# Patient Record
Sex: Female | Born: 1945 | Race: White | Hispanic: No | Marital: Married | Smoking: Former smoker
Health system: Southern US, Community
[De-identification: ages and names within clinical notes are randomized; demographics above are authoritative.]

## PROBLEM LIST (undated history)

## (undated) DIAGNOSIS — K5790 Diverticulosis of intestine, part unspecified, without perforation or abscess without bleeding: Secondary | ICD-10-CM

## (undated) DIAGNOSIS — Z803 Family history of malignant neoplasm of breast: Secondary | ICD-10-CM

## (undated) DIAGNOSIS — Z800 Family history of malignant neoplasm of digestive organs: Secondary | ICD-10-CM

## (undated) DIAGNOSIS — E785 Hyperlipidemia, unspecified: Secondary | ICD-10-CM

## (undated) DIAGNOSIS — I10 Essential (primary) hypertension: Secondary | ICD-10-CM

## (undated) DIAGNOSIS — Z8041 Family history of malignant neoplasm of ovary: Secondary | ICD-10-CM

## (undated) DIAGNOSIS — Z780 Asymptomatic menopausal state: Secondary | ICD-10-CM

## (undated) DIAGNOSIS — C801 Malignant (primary) neoplasm, unspecified: Secondary | ICD-10-CM

## (undated) DIAGNOSIS — M1990 Unspecified osteoarthritis, unspecified site: Secondary | ICD-10-CM

## (undated) DIAGNOSIS — Z808 Family history of malignant neoplasm of other organs or systems: Secondary | ICD-10-CM

## (undated) HISTORY — DX: Family history of malignant neoplasm of breast: Z80.3

## (undated) HISTORY — DX: Hyperlipidemia, unspecified: E78.5

## (undated) HISTORY — PX: MIDDLE EAR SURGERY: SHX713

## (undated) HISTORY — DX: Essential (primary) hypertension: I10

## (undated) HISTORY — DX: Family history of malignant neoplasm of other organs or systems: Z80.8

## (undated) HISTORY — PX: CLEFT PALATE REPAIR: SUR1165

## (undated) HISTORY — DX: Family history of malignant neoplasm of digestive organs: Z80.0

## (undated) HISTORY — DX: Family history of malignant neoplasm of ovary: Z80.41

## (undated) HISTORY — DX: Diverticulosis of intestine, part unspecified, without perforation or abscess without bleeding: K57.90

## (undated) HISTORY — DX: Asymptomatic menopausal state: Z78.0

## (undated) HISTORY — PX: BLADDER SURGERY: SHX569

## (undated) HISTORY — PX: ABDOMINAL HYSTERECTOMY: SHX81

---

## 2006-04-15 ENCOUNTER — Encounter: Payer: Self-pay | Admitting: Family Medicine

## 2006-06-27 ENCOUNTER — Encounter: Payer: Self-pay | Admitting: Family Medicine

## 2006-07-15 ENCOUNTER — Encounter: Payer: Self-pay | Admitting: Family Medicine

## 2006-08-03 LAB — HM COLONOSCOPY: HM Colonoscopy: NORMAL

## 2006-08-15 ENCOUNTER — Encounter: Payer: Self-pay | Admitting: Family Medicine

## 2006-09-02 ENCOUNTER — Encounter: Payer: Self-pay | Admitting: Family Medicine

## 2006-09-23 ENCOUNTER — Encounter: Payer: Self-pay | Admitting: Family Medicine

## 2006-09-24 ENCOUNTER — Encounter: Payer: Self-pay | Admitting: Family Medicine

## 2009-03-04 ENCOUNTER — Ambulatory Visit: Payer: Self-pay | Admitting: Family Medicine

## 2009-03-04 DIAGNOSIS — R1031 Right lower quadrant pain: Secondary | ICD-10-CM | POA: Insufficient documentation

## 2009-03-04 LAB — CONVERTED CEMR LAB
Basophils Absolute: 0.2 10*3/uL — ABNORMAL HIGH (ref 0.0–0.1)
Basophils Relative: 2 % — ABNORMAL HIGH (ref 0–1)
Bilirubin Urine: NEGATIVE
Eosinophils Absolute: 0 10*3/uL (ref 0.0–0.7)
Eosinophils Relative: 0 % (ref 0–5)
Glucose, Urine, Semiquant: NEGATIVE
HCT: 44.9 % (ref 36.0–46.0)
Hemoglobin: 15.4 g/dL — ABNORMAL HIGH (ref 12.0–15.0)
Lymphocytes Relative: 18 % (ref 12–46)
Lymphs Abs: 2.2 10*3/uL (ref 0.7–4.0)
MCHC: 34.3 g/dL (ref 30.0–36.0)
MCV: 91.5 fL (ref 78.0–100.0)
Monocytes Absolute: 0.1 10*3/uL (ref 0.1–1.0)
Monocytes Relative: 1 % — ABNORMAL LOW (ref 3–12)
Neutro Abs: 9.6 10*3/uL — ABNORMAL HIGH (ref 1.7–7.7)
Neutrophils Relative %: 79 % — ABNORMAL HIGH (ref 43–77)
Nitrite: POSITIVE
OCCULT 1: NEGATIVE
Platelets: 263 10*3/uL (ref 150–400)
RBC: 4.9 M/uL (ref 3.87–5.11)
RDW: 13.3 % (ref 11.5–15.5)
Specific Gravity, Urine: >=1.03
Urobilinogen, UA: 0.2
WBC Urine, dipstick: NEGATIVE
WBC: 12.2 10*3/uL — ABNORMAL HIGH (ref 4.0–10.5)
pH: 5.5

## 2009-03-05 ENCOUNTER — Encounter: Payer: Self-pay | Admitting: Family Medicine

## 2009-03-06 ENCOUNTER — Encounter: Payer: Self-pay | Admitting: Family Medicine

## 2009-03-23 ENCOUNTER — Telehealth (INDEPENDENT_AMBULATORY_CARE_PROVIDER_SITE_OTHER): Payer: Self-pay | Admitting: *Deleted

## 2009-07-13 ENCOUNTER — Ambulatory Visit: Payer: Self-pay | Admitting: Family Medicine

## 2009-07-13 ENCOUNTER — Encounter (INDEPENDENT_AMBULATORY_CARE_PROVIDER_SITE_OTHER): Payer: Self-pay | Admitting: *Deleted

## 2009-07-13 DIAGNOSIS — R1013 Epigastric pain: Secondary | ICD-10-CM

## 2009-07-13 DIAGNOSIS — K3189 Other diseases of stomach and duodenum: Secondary | ICD-10-CM | POA: Insufficient documentation

## 2009-07-14 ENCOUNTER — Encounter: Payer: Self-pay | Admitting: Family Medicine

## 2009-07-15 DIAGNOSIS — E785 Hyperlipidemia, unspecified: Secondary | ICD-10-CM | POA: Insufficient documentation

## 2009-07-15 LAB — CONVERTED CEMR LAB
ALT: 13 units/L (ref 0–35)
AST: 14 units/L (ref 0–37)
Albumin: 4.1 g/dL (ref 3.5–5.2)
Alkaline Phosphatase: 67 units/L (ref 39–117)
BUN: 12 mg/dL (ref 6–23)
CO2: 25 meq/L (ref 19–32)
Calcium: 9.5 mg/dL (ref 8.4–10.5)
Chloride: 106 meq/L (ref 96–112)
Cholesterol: 266 mg/dL — ABNORMAL HIGH (ref 0–200)
Creatinine, Ser: 0.82 mg/dL (ref 0.40–1.20)
Glucose, Bld: 89 mg/dL (ref 70–99)
HDL: 63 mg/dL (ref >39)
LDL Cholesterol: 176 mg/dL — ABNORMAL HIGH (ref 0–99)
Potassium: 4.3 meq/L (ref 3.5–5.3)
Sodium: 143 meq/L (ref 135–145)
Total Bilirubin: 0.7 mg/dL (ref 0.3–1.2)
Total CHOL/HDL Ratio: 4.2
Total Protein: 7.1 g/dL (ref 6.0–8.3)
Triglycerides: 134 mg/dL (ref <150)
VLDL: 27 mg/dL (ref 0–40)

## 2009-07-19 ENCOUNTER — Telehealth: Payer: Self-pay | Admitting: Family Medicine

## 2009-07-19 ENCOUNTER — Encounter: Payer: Self-pay | Admitting: Family Medicine

## 2009-07-22 ENCOUNTER — Telehealth: Payer: Self-pay | Admitting: Family Medicine

## 2009-09-01 ENCOUNTER — Encounter: Payer: Self-pay | Admitting: Family Medicine

## 2009-09-01 DIAGNOSIS — M899 Disorder of bone, unspecified: Secondary | ICD-10-CM | POA: Insufficient documentation

## 2009-09-01 DIAGNOSIS — M949 Disorder of cartilage, unspecified: Secondary | ICD-10-CM

## 2009-09-01 DIAGNOSIS — K5730 Diverticulosis of large intestine without perforation or abscess without bleeding: Secondary | ICD-10-CM | POA: Insufficient documentation

## 2010-07-04 NOTE — Progress Notes (Signed)
Summary: Question re: necessity of 2nd urine culture  Phone Note Call from Patient   Summary of Call: Patient was asked to repeat a urine culture 1 week from 03/06/09. Patient went out of town and was unable to complete. Is it still necessary at this point in time to repeat the urine culture? Patient would like [place] to be aware that she the grocery store's insurance company will be covering her claims for her sickness due to salsa that was ingested. Patient is concerned that insurance company will want 2nd urine culture? Patient can be reached back at [phone]. Initial call taken by: Margot Chimes,  March 23, 2009 2:01 PM    Please notify patient that a repeat urine culture will not be necessary if her symptoms have completely resolved and she now feels well. Donna Christen MD  March 29, 2009 8:27 AM   Called patient, she is asymptomatic, advised her that repeat urine culture would not be necessary.  Patient expresses understanding.

## 2010-07-04 NOTE — Progress Notes (Signed)
Summary: Rx SE  Phone Note Call from Patient   Caller: Patient Summary of Call: Pt states she has been taking both Rxs as directed but yesterday she started having GI pain and facial swelling. Pt has stopped both meds bc she doesn't know which is causing the SE Initial call taken by: Payton Spark CMA,  July 22, 2009 8:31 AM  Follow-up for Phone Call        mostly likely due to the Lisinopril.  Stop both for now.  Use Benadry 25 mg every 6 hrs thru w/e for allergic reaction.    Return next wk for nurse bP check.  Follow-up by: Seymour Bars DO,  July 22, 2009 8:38 AM     Appended Document: Rx SE Pt aware

## 2010-07-04 NOTE — Progress Notes (Signed)
Summary: 90 day supply  Phone Note Call from Patient   Caller: Patient Summary of Call: Pt states she is going to loss her insurance for 2 months. Pt would like 90 day supply of both Rx so she does not have to pay out of pocket while she has a lapse in coverage. Is this OK? Initial call taken by: Payton Spark CMA,  July 19, 2009 1:55 PM    Prescriptions: ZETIA 10 MG TABS (EZETIMIBE) 1 tab by mouth daily  #90 x 0   Entered and Authorized by:   Seymour Bars DO   Signed by:   Seymour Bars DO on 07/19/2009   Method used:   Printed then faxed to ...       [facility]* (retail)       [address] [place], [place]  [address]       Ph: [phone]       Fax: [phone]   RxID:   [id number] LISINOPRIL 10 MG TABS (LISINOPRIL) 1 tab by mouth daily  #90 x 0   Entered and Authorized by:   Seymour Bars DO   Signed by:   Seymour Bars DO on 07/19/2009   Method used:   Printed then faxed to ...       [facility]* (retail)       [address] [place], [place]  [address]       Ph: [phone]       Fax: [phone]   RxID:   [id number]   Appended Document: 90 day supply Pt aware

## 2010-07-04 NOTE — Assessment & Plan Note (Signed)
  Urine culture showed growth of e.coli.  Assessment:   UTI  Plan:  CEPHALEXIN 500 MG CAPS (CEPHALEXIN) One by mouth two times a day Increase fluid intake.   Repeat urine culture one week.   Notified patient (left message).    Prescriptions: CEPHALEXIN 500 MG CAPS (CEPHALEXIN) One by mouth two times a day  #14 x 0   Entered and Authorized by:   Donna Christen MD   Signed by:   Donna Christen MD on 03/06/2009   Method used:   Electronically to        [facility]* (retail)       [address] [place], [place]  [address]       Ph: [phone]       Fax: [phone]   RxID:   [id number]

## 2010-07-04 NOTE — Miscellaneous (Signed)
Summary: old records  Clinical Lists Changes  Problems: Added new problem of DIVERTICULOSIS OF COLON (ICD-562.10) Added new problem of OSTEOPENIA (ICD-733.90) Observations: Added new observation of PAST MED HX: HTN High chol with ?statin intolerance G2P2 postmenopausal  colonoscopy done at [facility] 3-08: polyp, int. hemorrhoids, diverticulosis Exercise 2D echo 4-08 Normal.  EF 60%. (09/01/2009 17:04) Added new observation of PRIMARY MD: Seymour Bars DO (09/01/2009 17:04) Added new observation of COLONNXTDUE: 08/02/2016 (09/01/2009 17:04) Added new observation of HDLNXTDUE: 07/14/2014 (09/01/2009 17:04) Added new observation of LDLNXTDUE: 07/14/2014 (09/01/2009 17:04) Added new observation of CREATNXTDUE: 07/14/2010 (09/01/2009 17:04) Added new observation of POTASSIUMDUE: 07/14/2010 (09/01/2009 17:04) Added new observation of LST COLON DT: 08/03/2006 (08/03/2006 17:05) Added new observation of COLONOSCOPY: normal (08/03/2006 17:05)     Colonoscopy Result Date:  08/03/2006 Colonoscopy Result:  normal Colonoscopy Next Due:  10 yr      Past History:  Past Medical History: HTN High chol with ?statin intolerance G2P2 postmenopausal  colonoscopy done at [facility] 3-08: polyp, int. hemorrhoids, diverticulosis Exercise 2D echo 4-08 Normal.  EF 60%.

## 2010-07-04 NOTE — Letter (Signed)
Summary: Letter to Patient with Colonoscopy Results/[facility]   Letter to Patient with Colonoscopy Results/[facility]   Imported By: Lanelle Bal 09/08/2009 10:55:46  _____________________________________________________________________  External Attachment:    Type:   Image     Comment:   External Document

## 2010-07-04 NOTE — Letter (Signed)
Summary: Primary Care Consult Scheduled Letter  [place] [facility]  [address]   [place], [place] [address]   Phone: [phone]  Fax: [phone]      07/13/2009 MRN: [id number]  [facility] [address] [place], [place]  [address]    Dear Ms. Rivard,      We have scheduled an appointment for you.  At the recommendation of Dr.BOWEN , we have scheduled you a COLONOSCOPY  with [facility] [place]  , DR Rhetta Mura  on [facility] 22,2011 at 10:30AM .  Their address is_[address] , [place] . The office phone number is [phone].  If this appointment day and time is not convenient for you, please feel free to call the office of the doctor you are being referred to at the number listed above and reschedule the appointment.     It is important for you to keep your scheduled appointments. We are here to make sure you are given good patient care. If you have questions or you have made changes to your appointment, please notify us at  [phone], ask for HELEN.    Thank you,  Patient Care Coordinator [place] [facility]

## 2010-07-04 NOTE — Assessment & Plan Note (Signed)
Summary: R side ABD  pain/LBP x 1 day rm 3    Vital Signs:  Patient Profile:   65 Years Old Female CC:      R side ABD pain x1 day Height:     63 inches Weight:      151 pounds O2 Sat:      100 % O2 treatment:    Room Air Temp:     97.1 degrees F oral Pulse rate:   64 / minute Pulse rhythm:   regular Resp:     16 per minute BP sitting:   157 / 94  (right arm) Cuff size:   regular  Pt. in pain?   yes    Location:   abdomen    Intensity:   5    Type:       dull  Vitals Entered By: Areta Haber CMA (March 04, 2009 10:56 AM)                   Current Allergies: No known allergies History of Present Illness Chief Complaint: R side ABD pain x1 day History of Present Illness: Subjective:  Patient complains of onset of dull right flank pain yesterday evening after eating nachos and cheese.  The pain persisted through the night, but she was finally able to fall asleep early this morning.  The pain recurred upon awakening, worse when walking/standing, better prone on bed.  She had nausea but no vomiting.  She felt constipated so took a laxative which exacerbated her pain.  She has had no fever but felt "clammy." No respiratory symptoms.  The pain radiates to her right lower abdomen.  She has had no urinary symptoms but presently has urge to urinate.  No vaginal discharge.  She has had a hsterectomy but ovaries remain.  Recently her bowel movements have been normal. She notes a family history of kidney stones.  Current Problems: RLQ PAIN (ICD-789.03) FAMILY HISTORY OF COLON CA 1ST DEGREE RELATIVE <60 (ICD-V16.0)   Current Meds LORTAB 5 5-500 MG TABS (HYDROCODONE-ACETAMINOPHEN) One or two tabs by mouth q4 to 6hr as needed pain PROMETHAZINE HCL 25 MG TABS (PROMETHAZINE HCL) 1 by mouth q4 to 6hr as needed nausea  REVIEW OF SYSTEMS Constitutional Symptoms      Denies fever, chills, night sweats, weight loss, weight gain, and fatigue.  Eyes       Denies change in vision, eye  pain, eye discharge, glasses, contact lenses, and eye surgery. Ear/Nose/Throat/Mouth       Denies hearing loss/aids, change in hearing, ear pain, ear discharge, dizziness, frequent runny nose, frequent nose bleeds, sinus problems, sore throat, hoarseness, and tooth pain or bleeding.  Respiratory       Denies dry cough, productive cough, wheezing, shortness of breath, asthma, bronchitis, and emphysema/COPD.  Cardiovascular       Denies murmurs, chest pain, and tires easily with exhertion.    Gastrointestinal       Complains of stomach pain.      Denies nausea/vomiting, diarrhea, constipation, blood in bowel movements, and indigestion.      Comments: R side/ R side LBP Genitourniary       Denies painful urination, kidney stones, and loss of urinary control. Neurological       Denies paralysis, seizures, and fainting/blackouts. Musculoskeletal       Denies muscle pain, joint pain, joint stiffness, decreased range of motion, redness, swelling, muscle weakness, and gout.  Skin       Denies  bruising, unusual mles/lumps or sores, and hair/skin or nail changes.  Psych       Denies mood changes, temper/anger issues, anxiety/stress, speech problems, depression, and sleep problems.  Past History:  Past Medical History: Unremarkable  Past Surgical History: Hysterectomy  Family History: Family History of Colon CA 1st degree relative Family History High cholesterol Family History Hypertension Brain Tumor - Cancer -  Brother  Social History: Married Never Smoked Alcohol use-yes, wine x3 weekly Drug use-no Regular exercise-yes Smoking Status:  never Drug Use:  no Does Patient Exercise:  yes    Objective:  Appearance:  Patient appears healthy, stated age, and in no acute distress  Mouth:  moist mucous membranes  Neck:  Supple.  No adenopathy is present.  No thyromegaly is present  Lungs:  Clear to auscultation.  Breath sounds are equal.  Heart:  Regular rate and rhythm without  murmurs, rubs, or gallops.  Abdomen:  Vague but minimal tenderness right lower quadrant without masses or hepatosplenomegaly.  Bowel sounds are present.  No CVA or flank tenderness.   Negative iliopsoas and obdurator tests. Skin:  No rash Genitourinary:  (Bimanual exam only without speculum):  Vulva appears normal without lesions or erythema.  No vaginal tenderness to palpation.  Cervix and uterus are absent.  .  Adnexae are nontender without massess.  Rectal exam negative and heme negative. urinalysis (dipstick):  Large blood, nit positive CBC:  WBC 12.2 Assessment New Problems: RLQ PAIN (ICD-789.03) FAMILY HISTORY OF COLON CA 1ST DEGREE RELATIVE <60 (ICD-V16.0)  Suspect nephrolithiasis  Plan New Medications/Changes: PROMETHAZINE HCL 25 MG TABS (PROMETHAZINE HCL) 1 by mouth q4 to 6hr as needed nausea  #10 x 0, 03/04/2009, Donna Christen MD LORTAB 5 5-500 MG TABS (HYDROCODONE-ACETAMINOPHEN) One or two tabs by mouth q4 to 6hr as needed pain  #12 (twelve) x 0, 03/04/2009, Donna Christen MD  New Orders: T-CBC w/Diff [[id number]] T-Urinalysis Dipstick only [[id number]] T-Urine Culture (Spectrum Order) [[id number]] New Patient Level III [29[address]] Planning Comments:   Push fluids.  May continue ibuprofen for pain, but given Rx for Lortab and promethazine if symptoms worsen.  Strain urine. Urine culture pending. Return for worsening symptoms  Follow-up with PCP if not improving 2 to 3 days.   The patient and/or caregiver has been counseled thoroughly with regard to medications prescribed including dosage, schedule, interactions, rationale for use, and possible side effects and they verbalize understanding.  Diagnoses and expected course of recovery discussed and will return if not improved as expected or if the condition worsens. Patient and/or caregiver verbalized understanding.  Prescriptions: PROMETHAZINE HCL 25 MG TABS (PROMETHAZINE HCL) 1 by mouth q4 to 6hr as needed nausea  #10 x 0    Entered and Authorized by:   Donna Christen MD   Signed by:   Donna Christen MD on 03/04/2009   Method used:   Print then Give to Patient   RxID:   [id number] LORTAB 5 5-500 MG TABS (HYDROCODONE-ACETAMINOPHEN) One or two tabs by mouth q4 to 6hr as needed pain  #12 (twelve) x 0   Entered and Authorized by:   Donna Christen MD   Signed by:   Donna Christen MD on 03/04/2009   Method used:   Print then Give to Patient   RxID:   [id number]   Laboratory Results   Urine Tests  Date/Time Received: Areta Haber CMA  March 04, 2009 11:15 AM  Date/Time Reported: Areta Haber CMA  March 04, 2009 11:15 AM   Routine  Urinalysis   Color: brown Appearance: Hazy Glucose: negative   (Normal Range: Negative) Bilirubin: negative   (Normal Range: Negative) Ketone: trace (5)   (Normal Range: Negative) Spec. Gravity: >=1.030   (Normal Range: 1.003-1.035) Blood: large   (Normal Range: Negative) pH: 5.5   (Normal Range: 5.0-8.0) Protein: trace   (Normal Range: Negative) Urobilinogen: 0.2   (Normal Range: 0-1) Nitrite: positive   (Normal Range: Negative) Leukocyte Esterace: negative   (Normal Range: Negative)    Date/Time Received: Areta Haber CMA  March 04, 2009 12:09 PM  Date/Time Reported: Areta Haber CMA  March 04, 2009 12:09 PM   Stool - Occult Blood Hemmoccult #1: negative Date: 03/04/2009   Appended Document: R side ABD  pain/LBP x 1 day rm 3  Correction to 03/04/09 Office Visit. Patient states that experienced onset of dull right flank pain after eating nachos and salsa. Original note states Nachos and cheese which is incorrect.

## 2010-07-04 NOTE — Letter (Signed)
Summary: Patient Cancellation/[facility]  Patient Cancellation/[facility]   Imported By: Lanelle Bal 07/28/2009 13:50:00  _____________________________________________________________________  External Attachment:    Type:   Image     Comment:   External Document

## 2010-07-04 NOTE — Assessment & Plan Note (Signed)
Summary: NOV work physical   Vital Signs:  Patient profile:   65 year old female Height:      63.5 inches Weight:      144 pounds BMI:     25.60 O2 Sat:      100 % on Room air Temp:     97.8 degrees F oral Pulse rate:   71 / minute BP sitting:   157 / 99  (left arm) Cuff size:   regular  Vitals Entered By: Sherlean Foot CMA (July 13, 2009 9:08 AM)  O2 Flow:  Room air CC: New to est. Form and TB for work.    Primary Care Provider:  Loyal Gambler DO  CC:  New to est. Form and TB for work. .  History of Present Illness: 65 yo WF presents for NOV.   She needs form completed to start a job as a Administrator, sports at [employer].  She is due for a TB test.  She takes no meds.  Hx of high cholesterol and HTN but is not on any meds.  Due for fasting labs.  s/p TAH without SBO in her 107s for heavy periods.  Married.  Has some chronic L hip pain and some epigastric pain after eating but no DOE.    She has + fam hx of gliobastoma multiforme (mother and brother) and her brother had colon cancer < age 104.  She is due for a 2 yr repeat colonoscopy.  She has + fam hx of premature heart dz.  It has been > 5 yrs since her last stress test.    Current Medications (verified): 1)  None  Allergies (verified): No Known Drug Allergies  Past History:  Past Medical History: HTN High chol with ?statin intolerance G2P2 postmenopausal  Past Surgical History: Hysterectomy w/o BSO for heavy periods  Family History: mother died of brain tumor at 35 brother died of brain tumor (Gliobastoma Multiforme) brother alive, AMI in 24s Brother alive, renal cell carcinoma, colon cancer father died of AMI, CVA < age 71.  Social History: Married with 2 grown children, 2 grown stepkids. Spends time outside but no regular exercise.  Has 3 beagles. Never Smoked Alcohol use-yes, wine x3 weekly Drug use-no Working PT at daycare.  Review of Systems       no fevers/sweats/weakness, unexplained wt  loss/gain, no change in vision, no difficulty hearing, ringing in ears, no hay fever/allergies, no CP/discomfort, no palpitations, no breast lump/nipple discharge, no cough/wheeze, no blood in stool,no  N/V/D, no nocturia, no leaking urine, no unusual vag bleeding, no vaginal/penile discharge, no muscle/joint pain, no rash, no new/changing mole, no HA, no memory loss, no anxiety, no sleep problem, no depression, no unexplained lumps, no easy bruising/bleeding, no concern with sexual function   Physical Exam  General:  alert, well-developed, well-nourished, and well-hydrated.   Head:  normocephalic and atraumatic.   Eyes:  pupils equal, pupils round, and pupils reactive to light.  wears glasses Ears:  surgical repair L TM seen. R TM with chronic scaring Nose:  no nasal discharge.   Mouth:  pharynx pink and moist.   Neck:  no masses.   Lungs:  Normal respiratory effort, chest expands symmetrically. Lungs are clear to auscultation, no crackles or wheezes. Heart:  Normal rate and regular rhythm. S1 and S2 normal without gallop, murmur, click, rub or other extra sounds. Abdomen:  soft, non-tender, normal bowel sounds, no distention, no masses, no guarding, no hepatomegaly, and no splenomegaly.  Pulses:  2+ radial and pedal pulses Extremities:  no LE edema Neurologic:  gait normal.   Skin:  color normal.   Cervical Nodes:  No lymphadenopathy noted Psych:  good eye contact, not anxious appearing, and not depressed appearing.     Impression & Recommendations:  Problem # 1:  OTH GENERAL MEDICAL EXAMINATION ADMIN PURPOSES (ICD-V70.3) Work physical done.  Form completed. PPD placed.  REad on Friday. BP high x 2 --> start on Lisinopril 10 mg once daily.  Update fasting labs and f/u in 4 wks. Update screening colonoscopy. EKG done for epigastric pain ? anginal equivalent.  EKG shows sinus bradycardia with normal axis and no sign of ischemia. Trial of Dexilant for epigastric pain, samples given.   Due to fam hx, complete cardiac risk stratification and update stress test this yr.  Complete Medication List: 1)  Lisinopril 10 Mg Tabs (Lisinopril) .Marland Kitchen.. 1 tab by mouth daily 2)  Aspir-low 81 Mg Tbec (Aspirin) .Marland Kitchen.. 1 tab by mouth daily w/ food  Other Orders: Gastroenterology Referral (GI) T-Comprehensive Metabolic Panel ([id number]) T-Lipid Profile ([id number]) EKG w/ Interpretation (93000) TB Skin Test [phone]) Admin 1st Vaccine [phone]) Admin 1st Vaccine [facility]) [phone])  Patient Instructions: 1)  Start Lisinopril once daily for high BP. 2)  Update fasting labs tomorrow morning.   3)  Will call you w/ results on Friday. 4)  TB today.  REad on Friday. 5)  EKG today. 6)  Trial of DEXILANT  1 capsule by mouth daily for possible acid reflux.  7)  Will set up your next screening colonoscopy. 8)  Return for f/u BP in 4 wks. Prescriptions: LISINOPRIL 10 MG TABS (LISINOPRIL) 1 tab by mouth daily  #30 x 1   Entered and Authorized by:   Loyal Gambler DO   Signed by:   Loyal Gambler DO on 07/13/2009   Method used:   Electronically to        [facility] (retail)       [place], [place]  [address]       Ph: [phone]       Fax: [phone]   RxID:   [id number]    PPD Application    Vaccine Type: PPD    Site: left forearm    Dose: 0.1 ml    Route: ID    Given by: Sherlean Foot CMA  Appended Document: NOV work physical   PPD Results    Date of reading: 07/15/2009    Results: < 32mm    Interpretation: negative   CC: TB skin test recheck The patient presented after 48 hours to check the injection site for positive or negative reaction. Inj site examination: neg Assessment and Plan: n/a Neg TB skin test. Pt counseled to call if experience any irriation of the site.

## 2010-11-16 ENCOUNTER — Encounter: Payer: Self-pay | Admitting: Family Medicine

## 2010-11-17 ENCOUNTER — Encounter: Payer: Self-pay | Admitting: Family Medicine

## 2010-11-17 ENCOUNTER — Ambulatory Visit (INDEPENDENT_AMBULATORY_CARE_PROVIDER_SITE_OTHER): Payer: BC Managed Care – PPO | Admitting: Family Medicine

## 2010-11-17 DIAGNOSIS — Z0000 Encounter for general adult medical examination without abnormal findings: Secondary | ICD-10-CM

## 2010-11-17 DIAGNOSIS — Z1231 Encounter for screening mammogram for malignant neoplasm of breast: Secondary | ICD-10-CM

## 2010-11-17 DIAGNOSIS — I10 Essential (primary) hypertension: Secondary | ICD-10-CM

## 2010-11-17 DIAGNOSIS — E785 Hyperlipidemia, unspecified: Secondary | ICD-10-CM

## 2010-11-17 DIAGNOSIS — Z780 Asymptomatic menopausal state: Secondary | ICD-10-CM

## 2010-11-17 MED ORDER — PREDNISONE 20 MG PO TABS: 20.0000 mg | ORAL_TABLET | Freq: Every day | ORAL | Status: AC

## 2010-11-17 MED ORDER — AMLODIPINE BESYLATE 5 MG PO TABS: 5.0000 mg | ORAL_TABLET | Freq: Every day | ORAL | Status: DC

## 2010-11-17 NOTE — Progress Notes (Signed)
  Subjective:    Patient ID: Samantha Cross, female    DOB: [birth year], 65 y.o.   MRN: [id number]  HPI  64yo WF presents for CPE without pap.  She is due for DEXA and mammogram.  Married, monogamous, s/p TAH for DUB w/o oophorectomy.  Doing well but she stopped Zetia and Lisinopril due to lip/ tongue swelling b/c she wasn't sure which was the culprit.  Denies chest pain or DOE.  Non smoker. Denies fam hx of premature heart dz, +colon cancer, no breast cancer..  Last colonoscopy in 08 + for polyps and diverticulosis  Done in [place].  Denies rectal beeding.  Has L hip pain that improves with iburpofen.  Does not take any vitamins.  She is physiclaly active.    BP 154/98  Pulse 71  Ht 5' 3.5" (1.613 m)  Wt 150 lb (68.04 kg)  BMI 26.15 kg/m2  SpO2 98% Patient Active Problem List  Diagnoses  . HYPERLIPIDEMIA  . DYSPEPSIA  . DIVERTICULOSIS OF COLON  . OSTEOPENIA  . RLQ PAIN      Review of Systems  Constitutional: Negative for fatigue.  HENT: Negative for sore throat.   Eyes: Negative for visual disturbance.  Respiratory: Negative for shortness of breath.   Cardiovascular: Negative for chest pain, palpitations and leg swelling.  Gastrointestinal: Negative for nausea, abdominal pain, diarrhea, constipation and blood in stool.  Genitourinary: Negative for urgency, vaginal bleeding, difficulty urinating and menstrual problem.  Musculoskeletal: Positive for arthralgias. Negative for joint swelling.  Skin: Negative for rash.  Neurological: Negative for headaches.  Psychiatric/Behavioral: Negative for sleep disturbance and dysphoric mood. The patient is not nervous/anxious.        Objective:   Physical Exam  Constitutional: She appears well-developed and well-nourished. No distress.  HENT:  Head: Normocephalic and atraumatic.  Nose: Nose normal.  Mouth/Throat: Oropharynx is clear and moist.  Eyes: Conjunctivae are normal. Pupils are equal, round, and reactive to light. No scleral  icterus.  Neck: Neck supple. No thyromegaly present.  Cardiovascular: Normal rate, regular rhythm, normal heart sounds and intact distal pulses.   No murmur heard. Pulmonary/Chest: Effort normal and breath sounds normal. No respiratory distress. She has no wheezes.  Abdominal: Soft. Bowel sounds are normal. She exhibits no distension. There is no tenderness.       No HSM, no AA bruits  Musculoskeletal: She exhibits no edema.  Lymphadenopathy:    She has no cervical adenopathy.  Neurological: She has normal reflexes.  Skin: Skin is warm and dry.  Psychiatric: She has a normal mood and affect.          Assessment & Plan:  CPE:   Does not need PAP smear given hx of TAH for non cancerous reasons, no bleeding, no new partners. Mammogram and DEXA order printed. Fasting lab order printed. BP high -- added Norvasc 5 mg/ day.  Recheck in 2 mos. Colonoscopy done 4 yrs ago, likely due for 5 yr repeat but I gave her the # for [facility] who she saw last time to f/u. She will f/u for pedunculated mole removal / L hip pain in 6 wks.

## 2010-11-17 NOTE — Patient Instructions (Signed)
Update mammogram and DEXA downstairs [phone].  Call GI for f/u appt.  Update fasting labs one morning. Will call you w/ results.  REturn for mole removal/ L hip pain in 3 wks.

## 2010-11-20 ENCOUNTER — Telehealth: Payer: Self-pay | Admitting: Family Medicine

## 2010-11-20 DIAGNOSIS — Z1211 Encounter for screening for malignant neoplasm of colon: Secondary | ICD-10-CM

## 2010-11-20 NOTE — Telephone Encounter (Signed)
Addended by: Seymour Bars E on: 11/20/2010 11:58 AM   Modules accepted: Orders

## 2010-11-20 NOTE — Telephone Encounter (Signed)
Done

## 2010-11-20 NOTE — Telephone Encounter (Signed)
Patient needs a referral for a colonoscopy because the GI office would not let her schedule herself. Thanks, [employer]

## 2010-11-21 ENCOUNTER — Telehealth: Payer: Self-pay | Admitting: Family Medicine

## 2010-11-21 DIAGNOSIS — W57XXXA Bitten or stung by nonvenomous insect and other nonvenomous arthropods, initial encounter: Secondary | ICD-10-CM

## 2010-11-21 LAB — CBC WITH DIFFERENTIAL/PLATELET
Basophils Absolute: 0.1 10*3/uL (ref 0.0–0.1)
Basophils Relative: 1 % (ref 0–1)
Eosinophils Absolute: 0.3 10*3/uL (ref 0.0–0.7)
Eosinophils Relative: 4 % (ref 0–5)
HCT: 48.3 % — ABNORMAL HIGH (ref 36.0–46.0)
Hemoglobin: 14.8 g/dL (ref 12.0–15.0)
Lymphocytes Relative: 45 % (ref 12–46)
Lymphs Abs: 3.7 10*3/uL (ref 0.7–4.0)
MCH: 30 pg (ref 26.0–34.0)
MCHC: 30.6 g/dL (ref 30.0–36.0)
MCV: 97.8 fL (ref 78.0–100.0)
Monocytes Absolute: 0.6 10*3/uL (ref 0.1–1.0)
Monocytes Relative: 7 % (ref 3–12)
Neutro Abs: 3.4 10*3/uL (ref 1.7–7.7)
Neutrophils Relative %: 42 % — ABNORMAL LOW (ref 43–77)
Platelets: 328 10*3/uL (ref 150–400)
RBC: 4.94 MIL/uL (ref 3.87–5.11)
RDW: 13.7 % (ref 11.5–15.5)
WBC: 8.1 10*3/uL (ref 4.0–10.5)

## 2010-11-21 NOTE — Telephone Encounter (Signed)
Added on lyme titer per pt request

## 2010-11-22 ENCOUNTER — Telehealth: Payer: Self-pay | Admitting: Family Medicine

## 2010-11-22 LAB — LIPID PANEL
Cholesterol: 269 mg/dL — ABNORMAL HIGH (ref 0–200)
HDL: 60 mg/dL (ref >39)
LDL Cholesterol: 180 mg/dL — ABNORMAL HIGH (ref 0–99)
Total CHOL/HDL Ratio: 4.5 Ratio
Triglycerides: 146 mg/dL (ref <150)
VLDL: 29 mg/dL (ref 0–40)

## 2010-11-22 LAB — COMPLETE METABOLIC PANEL WITH GFR
ALT: 11 U/L (ref 0–35)
AST: 12 U/L (ref 0–37)
Albumin: 4.2 g/dL (ref 3.5–5.2)
Alkaline Phosphatase: 78 U/L (ref 39–117)
BUN: 12 mg/dL (ref 6–23)
CO2: 23 mEq/L (ref 19–32)
Calcium: 9.6 mg/dL (ref 8.4–10.5)
Chloride: 105 mEq/L (ref 96–112)
Creat: 0.78 mg/dL (ref 0.50–1.10)
GFR, Est African American: >60 mL/min (ref >60)
GFR, Est Non African American: >60 mL/min (ref >60)
Glucose, Bld: 87 mg/dL (ref 70–99)
Potassium: 4.2 mEq/L (ref 3.5–5.3)
Sodium: 138 mEq/L (ref 135–145)
Total Bilirubin: 0.4 mg/dL (ref 0.3–1.2)
Total Protein: 7.4 g/dL (ref 6.0–8.3)

## 2010-11-22 LAB — B. BURGDORFI ANTIBODIES: B burgdorferi Ab IgG+IgM: 0.35 {ISR}

## 2010-11-22 MED ORDER — COLESEVELAM HCL 3.75 G PO PACK: PACK | ORAL | Status: DC

## 2010-11-22 NOTE — Telephone Encounter (Signed)
Pls let pt know that her blood counts came back normal. f asting sugar, liver and kidney function came back normal.  Cholesterol is HIGH with a total of 269 and an LDL bad chol 69f 180.  She is high risk for heart dz and stroke with this but I reivewed her chart and she has a ? Hx of statin - intolerance.  Would she be willing to retry or take non statin therapy?

## 2010-11-22 NOTE — Telephone Encounter (Signed)
Pls let pt know that her antibody to [facility] DISEASE is negative to both acute infection and negative for previous infection.

## 2010-11-22 NOTE — Telephone Encounter (Signed)
Pt aware of the above. She agrees to try a non-statin drug. Please send to pharm.

## 2010-11-22 NOTE — Telephone Encounter (Signed)
I am starting her on Welchol once daily with her largest meal of the day.  This comes in powder form and can be mixed in any liquid.  She can pick up samples here.  After 3 mos, we will recheck her labs.

## 2010-11-23 ENCOUNTER — Telehealth: Payer: Self-pay | Admitting: *Deleted

## 2010-11-23 ENCOUNTER — Telehealth: Payer: Self-pay | Admitting: Family Medicine

## 2010-11-23 MED ORDER — SCOPOLAMINE 1 MG/3DAYS TD PT72: 1.0000 | MEDICATED_PATCH | TRANSDERMAL | Status: DC

## 2010-11-23 NOTE — Telephone Encounter (Signed)
Pt requests patches for motion sickness. Please send to pharm

## 2010-11-23 NOTE — Telephone Encounter (Signed)
The patient to check her pharmacy resent the scopolamine patches. I think she re: asked Samantha Cross about it this morning and had sent them.  if she is not Medicare then we have coupons cards that make it very affordable. You might want to double check that we have some coupons cards still left in the closet before she comes out. If not she can also go on line to the drug's website and see if they have any cards for money off her co-pay. If we don't have any coupons cards let me know.

## 2010-11-23 NOTE — Telephone Encounter (Signed)
rx sent

## 2010-11-23 NOTE — Telephone Encounter (Signed)
Pt aware of the above  

## 2010-11-23 NOTE — Telephone Encounter (Signed)
Pt calling about her recent chol med Welchol 3.75 gram pack prescribed.  The cost is $249.00, and pt feels they cannot pay this.  She is on fixed income.  Also leaving to go on trip and would like to get a script for motion sickness patches.  Will be on a boat fishing. (scolpolomine patches)  Plan:  Routed to Dr Linford Arnold since Dr. Cathey Endow not in the office. Jarvis Newcomer, LPN [facility]

## 2010-11-28 ENCOUNTER — Telehealth: Payer: Self-pay | Admitting: Family Medicine

## 2010-11-28 NOTE — Telephone Encounter (Signed)
Patches have been picked up at the pharm by the patient.  Pt cannot afford the Welchol packets.  Too expensive of a copay.  Will check for coupon cards for the welchol for the patient. Jarvis Newcomer, LPN [facility]

## 2010-11-28 NOTE — Telephone Encounter (Signed)
Closed

## 2010-11-29 ENCOUNTER — Other Ambulatory Visit: Payer: Self-pay | Admitting: Family Medicine

## 2010-11-29 MED ORDER — COLESEVELAM HCL 3.75 G PO PACK: 3.7500 g | PACK | ORAL | Status: DC

## 2010-11-29 NOTE — Telephone Encounter (Signed)
Pt notified to pup samples of the Welchol 3.75 mg packets # 9, and coupon card for welchol. Jarvis Newcomer, LPN [facility]

## 2010-12-12 ENCOUNTER — Ambulatory Visit
Admission: RE | Admit: 2010-12-12 | Discharge: 2010-12-12 | Disposition: A | Discharge status: Home / Self Care | Payer: BC Managed Care – PPO | Source: Ambulatory Visit | Attending: Family Medicine | Admitting: Family Medicine

## 2010-12-12 ENCOUNTER — Ambulatory Visit (INDEPENDENT_AMBULATORY_CARE_PROVIDER_SITE_OTHER): Payer: BC Managed Care – PPO | Admitting: Family Medicine

## 2010-12-12 ENCOUNTER — Encounter: Payer: Self-pay | Admitting: Family Medicine

## 2010-12-12 ENCOUNTER — Telehealth: Payer: Self-pay | Admitting: Family Medicine

## 2010-12-12 VITALS — BP 138/86 | HR 69 | Wt 151.0 lb

## 2010-12-12 DIAGNOSIS — L919 Hypertrophic disorder of the skin, unspecified: Secondary | ICD-10-CM

## 2010-12-12 DIAGNOSIS — M25552 Pain in left hip: Secondary | ICD-10-CM | POA: Insufficient documentation

## 2010-12-12 DIAGNOSIS — L918 Other hypertrophic disorders of the skin: Secondary | ICD-10-CM | POA: Insufficient documentation

## 2010-12-12 DIAGNOSIS — M25559 Pain in unspecified hip: Secondary | ICD-10-CM

## 2010-12-12 DIAGNOSIS — M169 Osteoarthritis of hip, unspecified: Secondary | ICD-10-CM

## 2010-12-12 DIAGNOSIS — L909 Atrophic disorder of skin, unspecified: Secondary | ICD-10-CM

## 2010-12-12 DIAGNOSIS — Z780 Asymptomatic menopausal state: Secondary | ICD-10-CM

## 2010-12-12 DIAGNOSIS — Z1231 Encounter for screening mammogram for malignant neoplasm of breast: Secondary | ICD-10-CM

## 2010-12-12 DIAGNOSIS — M1612 Unilateral primary osteoarthritis, left hip: Secondary | ICD-10-CM | POA: Insufficient documentation

## 2010-12-12 NOTE — Patient Instructions (Signed)
Xray L hip today. Will call you w/ results tomorrow.  Clean skin tag lesion with soap and water daily. Use Polysporin and a bandaid if needed.  Call if any problems.

## 2010-12-12 NOTE — Progress Notes (Signed)
  Subjective:    Patient ID: OLUWADARA GORMAN, female    DOB: Mar 10, 1946, 65 y.o.   MRN: [id number]  HPI  65 yo WF presents for skin tag removal around her collar line that irritate her when she wears necklaces.  She also has L hip pain x mos which radiates to her grown.  No limp.  Pain keeps her up at night but tylenol or ibuprofen helps.    BP 138/86  Pulse 69  Wt 151 lb (68.493 kg)  SpO2 100%  Review of Systems as per HPI    Objective:   Physical Exam  Constitutional: She appears well-developed and well-nourished.  Musculoskeletal:       No limp.  Full active L hip ROM  Skin:       Small skin tags on collar line of neck          Assessment & Plan:  SKIN TAGS:  Cleaned around neck with alcohol swab.  Pulled traction with sterile forcepts and use liquid nitrogen to freeze base of 6 skin tags.   Wound care instructions given.

## 2010-12-12 NOTE — Telephone Encounter (Signed)
LMOM informing Pt of the above 

## 2010-12-12 NOTE — Assessment & Plan Note (Signed)
L hip pain, chronic.  Radiating to groin w/o injury, likely to be DJD.  Since she is needing daily pain reliever, will proceed with XRAY today.

## 2010-12-12 NOTE — Telephone Encounter (Signed)
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  OK to stay on ibuprofen or tylenol prn.  If causing a limp or problems with exercise, I will have her see an orthopedist.

## 2010-12-13 ENCOUNTER — Telehealth: Payer: Self-pay | Admitting: Family Medicine

## 2010-12-13 DIAGNOSIS — M810 Age-related osteoporosis without current pathological fracture: Secondary | ICD-10-CM | POA: Insufficient documentation

## 2010-12-13 NOTE — Telephone Encounter (Signed)
Pls let pt know that her DEXA scan is + for osteoporosis.  I would suggest we start her either on an oral bisphosphonate once a wk or once a month OR a once a year IV infusion of Reclast which is done at the hospital in addition to calcium with D daily.  She is HIGH RISK for fracture.

## 2010-12-13 NOTE — Telephone Encounter (Signed)
Pt aware and would like to try the once weekly oral med

## 2010-12-14 MED ORDER — ALENDRONATE SODIUM 70 MG PO TABS: 70.0000 mg | ORAL_TABLET | ORAL | Status: AC

## 2010-12-14 NOTE — Telephone Encounter (Signed)
RX for Alendronate 70 mg once weekly sent.  Instructions are to take on empty stomach with full glass of water and stay upright for an hour after taking it.  She is to also take Caltrate D 2 x a day along with this.  Will be due to repeat her DEXA after 2 yrs.  Call if any problems.

## 2011-01-10 LAB — HM COLONOSCOPY

## 2011-11-01 ENCOUNTER — Other Ambulatory Visit: Payer: Self-pay | Admitting: *Deleted

## 2011-11-01 MED ORDER — AMLODIPINE BESYLATE 5 MG PO TABS: 5.0000 mg | ORAL_TABLET | Freq: Every day | ORAL | Status: DC

## 2011-12-02 ENCOUNTER — Other Ambulatory Visit: Payer: Self-pay | Admitting: Family Medicine

## 2011-12-30 ENCOUNTER — Other Ambulatory Visit: Payer: Self-pay | Admitting: Family Medicine

## 2011-12-31 NOTE — Telephone Encounter (Signed)
Must make appointment 

## 2012-01-31 ENCOUNTER — Other Ambulatory Visit: Payer: Self-pay | Admitting: Family Medicine

## 2012-02-29 ENCOUNTER — Ambulatory Visit (INDEPENDENT_AMBULATORY_CARE_PROVIDER_SITE_OTHER): Payer: BC Managed Care – PPO | Admitting: Family Medicine

## 2012-02-29 ENCOUNTER — Encounter: Payer: Self-pay | Admitting: Family Medicine

## 2012-02-29 VITALS — BP 153/91 | HR 77 | Wt 135.0 lb

## 2012-02-29 DIAGNOSIS — M25559 Pain in unspecified hip: Secondary | ICD-10-CM

## 2012-02-29 DIAGNOSIS — M810 Age-related osteoporosis without current pathological fracture: Secondary | ICD-10-CM

## 2012-02-29 DIAGNOSIS — Z0000 Encounter for general adult medical examination without abnormal findings: Secondary | ICD-10-CM

## 2012-02-29 DIAGNOSIS — E785 Hyperlipidemia, unspecified: Secondary | ICD-10-CM

## 2012-02-29 DIAGNOSIS — I10 Essential (primary) hypertension: Secondary | ICD-10-CM

## 2012-02-29 DIAGNOSIS — L918 Other hypertrophic disorders of the skin: Secondary | ICD-10-CM

## 2012-02-29 DIAGNOSIS — G8929 Other chronic pain: Secondary | ICD-10-CM

## 2012-02-29 DIAGNOSIS — Z23 Encounter for immunization: Secondary | ICD-10-CM

## 2012-02-29 MED ORDER — AMLODIPINE BESYLATE 10 MG PO TABS: 10.0000 mg | ORAL_TABLET | Freq: Every day | ORAL | Status: DC

## 2012-02-29 NOTE — Patient Instructions (Addendum)
We will call you with your lab results. If you don't here from us in about a week then please give us a call at [phone]. Start a regular exercise program and make sure you are eating a healthy diet Try to eat 4 servings of dairy a day . Your vaccines are up to date.   

## 2012-02-29 NOTE — Progress Notes (Signed)
Subjective:     Samantha Cross is a 66 y.o. female and is here for a comprehensive physical exam. The patient reports no problems.   Chronic Left hip pain. She still having problems with her left hip. Then going on for couple years but this point it's been getting worse and keeping her awake most nights. She says she does take occasional ibuprofen which she does get relief from. She did have an x-ray done in July 2012 which showed some moderate degenerative hip disease. Nothing acute or fractures.  History   Social History  . Marital Status: Married    Spouse Name: N/A    Number of Children: N/A  . Years of Education: N/A   Occupational History  . Not on file.   Social History Main Topics  . Smoking status: Never Smoker   . Smokeless tobacco: Not on file  . Alcohol Use: 1.8 oz/week    3 Glasses of wine per week     per week  . Drug Use: No  . Sexually Active:      married, 2 grown children, 2 stepkids, no regular exercise, 3 beagles, working PT at daycare.   Other Topics Concern  . Not on file   Social History Narrative  . No narrative on file   Health Maintenance  Topic Date Due  . Tetanus/tdap  04/25/1967  . Zostavax  04/24/2008  . Mammogram  12/11/2012  . Colonoscopy  01/09/2021  . Influenza Vaccine  02/03/2012    The following portions of the patient's history were reviewed and updated as appropriate: allergies, current medications, past family history, past medical history, past social history, past surgical history and problem list.  Review of Systems A comprehensive review of systems was negative.   Objective:    BP 153/91  Pulse 77  Wt 135 lb (61.236 kg) General appearance: alert, cooperative and appears stated age Head: Normocephalic, without obvious abnormality, atraumatic Eyes: conj clear, EOMi, PEERLa Ears: normal TM's and external ear canals both ears Nose: Nares normal. Septum midline. Mucosa normal. No drainage or sinus tenderness. Throat:  lips, mucosa, and tongue normal; teeth and gums normal Neck: no adenopathy, no carotid bruit, no JVD, supple, symmetrical, trachea midline and thyroid not enlarged, symmetric, no tenderness/mass/nodules Back: symmetric, no curvature. ROM normal. No CVA tenderness. Lungs: clear to auscultation bilaterally Breasts: normal appearance, no masses or tenderness Heart: regular rate and rhythm, S1, S2 normal, no murmur, click, rub or gallop Abdomen: soft, non-tender; bowel sounds normal; no masses,  no organomegaly Extremities: extremities normal, atraumatic, no cyanosis or edema Pulses: 2+ and symmetric Skin: Skin color, texture, turgor normal. No rashes or lesions Lymph nodes: Cervical, supraclavicular, and axillary nodes normal. Neurologic: Grossly normal    Assessment:    Healthy female exam.      Plan:     See After Visit Summary for Counseling Recommendations  Start a regular exercise program and make sure you are eating a healthy diet Try to eat 4 servings of dairy a day  Your vaccines are up to date.   Tdap updated today. Flu vaccine declined today.  Hyperlipidemia-she chooses not to treat with medications. She says she's very sensitive to medications and has reactions to them. She knows her nostrils-has been taking red yeast rice. She was previously given WelChol but says she had a reaction to it but Samantha Cross exactly what it was.  Hypertension high-uncontrolled. We discussed increasing her amlodipine to 10 mg. She's not willing to try a second  medication but is okay with increasing her current medication.  Osteoporosis-she said she had a reaction to Fosamax it better in the hospital and so she refuses all bisphosphonates and bone density medications.  Chronic left hip pain-

## 2012-03-05 ENCOUNTER — Ambulatory Visit (INDEPENDENT_AMBULATORY_CARE_PROVIDER_SITE_OTHER): Payer: BC Managed Care – PPO | Admitting: Family Medicine

## 2012-03-05 DIAGNOSIS — Z2911 Encounter for prophylactic immunotherapy for respiratory syncytial virus (RSV): Secondary | ICD-10-CM

## 2012-03-05 DIAGNOSIS — Z23 Encounter for immunization: Secondary | ICD-10-CM

## 2012-03-05 LAB — LIPID PANEL
Cholesterol: 244 mg/dL — ABNORMAL HIGH (ref 0–200)
HDL: 77 mg/dL (ref >39)
LDL Cholesterol: 151 mg/dL — ABNORMAL HIGH (ref 0–99)
Total CHOL/HDL Ratio: 3.2 Ratio
Triglycerides: 78 mg/dL (ref <150)
VLDL: 16 mg/dL (ref 0–40)

## 2012-03-05 NOTE — Progress Notes (Signed)
  Subjective:    Patient ID: Samantha Cross, female    DOB: 1945/09/07, 66 y.o.   MRN: [id number] Shingles Injection HPI    Review of Systems     Objective:   Physical Exam        Assessment & Plan:

## 2012-03-06 ENCOUNTER — Ambulatory Visit (INDEPENDENT_AMBULATORY_CARE_PROVIDER_SITE_OTHER): Payer: BC Managed Care – PPO | Admitting: Sports Medicine

## 2012-03-06 ENCOUNTER — Ambulatory Visit: Payer: BC Managed Care – PPO | Admitting: Sports Medicine

## 2012-03-06 ENCOUNTER — Encounter: Payer: Self-pay | Admitting: *Deleted

## 2012-03-06 ENCOUNTER — Encounter: Payer: Self-pay | Admitting: Sports Medicine

## 2012-03-06 VITALS — BP 130/80 | HR 79 | Wt 134.0 lb

## 2012-03-06 DIAGNOSIS — M169 Osteoarthritis of hip, unspecified: Secondary | ICD-10-CM

## 2012-03-06 DIAGNOSIS — M1610 Unilateral primary osteoarthritis, unspecified hip: Secondary | ICD-10-CM

## 2012-03-06 LAB — COMPLETE METABOLIC PANEL WITH GFR
ALT: 12 U/L (ref 0–35)
AST: 13 U/L (ref 0–37)
Albumin: 4.2 g/dL (ref 3.5–5.2)
Alkaline Phosphatase: 69 U/L (ref 39–117)
BUN: 13 mg/dL (ref 6–23)
CO2: 26 mEq/L (ref 19–32)
Calcium: 9.6 mg/dL (ref 8.4–10.5)
Chloride: 103 mEq/L (ref 96–112)
Creat: 0.69 mg/dL (ref 0.50–1.10)
GFR, Est African American: >89 mL/min
GFR, Est Non African American: >89 mL/min
Glucose, Bld: 87 mg/dL (ref 70–99)
Potassium: 4.3 mEq/L (ref 3.5–5.3)
Sodium: 139 mEq/L (ref 135–145)
Total Bilirubin: 0.9 mg/dL (ref 0.3–1.2)
Total Protein: 7.1 g/dL (ref 6.0–8.3)

## 2012-03-06 NOTE — Assessment & Plan Note (Signed)
Ultrasound guided injection as above. Continue oral analgesics as needed. Come back to see me in 3 weeks to see how she's doing.

## 2012-03-06 NOTE — Progress Notes (Signed)
Subjective:    I'm seeing this patient as a consultation for:  Dr. Linford Arnold  CC: Left groin pain  HPI: The is a very pleasant 66 year old female with a long history of pain she localizes into her left groin, around the outside of her left hip, and her left deep buttock. 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  She's been using ibuprofen which is inadequately effective. At this point, the pain causes a limp, and is bothersome through the day. She's never had an intra-articular injection.   Of note, she also localizes some pain running down into her shin, I suspect this is more likely related to her back, and she also agrees that this different type of pain than she has in her groin.  Past medical history, Surgical history, Family history, Social history, Allergies, and medications have been entered into the medical record, reviewed, and no changes needed.   Review of Systems: No headache, visual changes, nausea, vomiting, diarrhea, constipation, dizziness, abdominal pain, skin rash, fevers, chills, night sweats, weight loss, swollen lymph nodes, body aches, joint swelling, muscle aches, chest pain, or shortness of breath.   Objective:   Vitals:  Afebrile, vital signs stable. General: Well Developed, well nourished, and in no acute distress.  Neuro/Psych: Alert and oriented x3, extra-ocular muscles intact, able to move all 4 extremities.  Skin: Warm and dry, no rashes noted.  Respiratory: Not using accessory muscles, speaking in full sentences, trachea midline.  Cardiovascular: Pulses palpable, no extremity edema. Abdomen: Does not appear distended. Left Hip: ROM IR: 20 Deg, ER: 45 Deg, Flexion: 120 Deg, Extension: 100 Deg, Abduction: 45 Deg, Adduction: 45 Deg Strength IR: 5/5, ER: 5/5, Flexion: 5/5, Extension: 5/5, Abduction: 5/5, Adduction: 5/5 Pelvic alignment unremarkable to inspection and palpation. Standing hip rotation and gait without trendelenburg sign /  unsteadiness. Greater trochanter without tenderness to palpation. No tenderness over piriformis and greater trochanter. No pain with FABER or FADIR. No SI joint tenderness and normal minimal SI movement.  Procedure: Real-time Ultrasound Guided Injection of left femoral acetabular joint Device: GE Logiq E  Ultrasound guided injection is preferred based studies that show increased duration, increased effect, greater accuracy, decreased procedural pain, increased response rate, and decreased cost with ultrasound guided versus blind injection.  Verbal informed consent obtained.  Time-out conducted.  Noted no overlying erythema, induration, or other signs of local infection.  Skin prepped in a sterile fashion.  Local anesthesia: Topical Ethyl chloride.  With sterile technique and under real time ultrasound guidance:  Spinal needle advanced to the femoral head/neck junction, 2 cc Kenalog 40, 4 cc lidocaine injected easily, and capsule seen distending. Completed without difficulty  Pain immediately resolved suggesting accurate placement of the medication.  Advised to call if fevers/chills, erythema, induration, drainage, or persistent bleeding.  Images permanently stored and available for review in the ultrasound unit.  Impression: Technically successful ultrasound guided injection.  Impression and Recommendations:   This case required medical decision making of moderate complexity.

## 2012-03-27 ENCOUNTER — Ambulatory Visit (INDEPENDENT_AMBULATORY_CARE_PROVIDER_SITE_OTHER): Payer: BC Managed Care – PPO | Admitting: Sports Medicine

## 2012-03-27 ENCOUNTER — Ambulatory Visit (INDEPENDENT_AMBULATORY_CARE_PROVIDER_SITE_OTHER): Payer: BC Managed Care – PPO

## 2012-03-27 ENCOUNTER — Encounter: Payer: Self-pay | Admitting: Sports Medicine

## 2012-03-27 VITALS — BP 131/89 | HR 74 | Wt 132.0 lb

## 2012-03-27 DIAGNOSIS — M5416 Radiculopathy, lumbar region: Secondary | ICD-10-CM

## 2012-03-27 DIAGNOSIS — M1610 Unilateral primary osteoarthritis, unspecified hip: Secondary | ICD-10-CM

## 2012-03-27 DIAGNOSIS — M169 Osteoarthritis of hip, unspecified: Secondary | ICD-10-CM

## 2012-03-27 DIAGNOSIS — M545 Low back pain, unspecified: Secondary | ICD-10-CM

## 2012-03-27 DIAGNOSIS — IMO0002 Reserved for concepts with insufficient information to code with codable children: Secondary | ICD-10-CM

## 2012-03-27 MED ORDER — GABAPENTIN 300 MG PO CAPS: ORAL_CAPSULE | ORAL | Status: DC

## 2012-03-27 MED ORDER — PREDNISONE 50 MG PO TABS: ORAL_TABLET | ORAL | Status: DC

## 2012-03-27 NOTE — Progress Notes (Signed)
Subjective:    CC: Followup  HPI: Left hip degenerative joint disease:  Pain was localized in the left groin, I injected this under ultrasound guidance approximately 3 weeks ago, and her pain remains 100% resolved.  Burning sensation down her left leg: She did mention this at the last visit, and was notified that this is likely a separate process. Again, she notes this at random occasions, worse with long car rides, and worse with forward flexion. She notes a burning type sensation that travels down her left buttock, left thigh, anterior aspect of her left lower leg, into the dorsum of her left foot. It occasionally does wake her from sleep at night.  She's never had her back imaged, or investigated. No bowel or bladder function loss.   Past medical history, Surgical history, Family history, Social history, Allergies, and medications have been entered into the medical record, reviewed, and no changes needed.   Review of Systems: No headache, visual changes, nausea, vomiting, diarrhea, constipation, dizziness, abdominal pain, skin rash, fevers, chills, night sweats, weight loss, swollen lymph nodes, body aches, joint swelling, muscle aches, chest pain, or shortness of breath.   Objective:   Vitals:  Afebrile, vital signs stable. General: Well Developed, well nourished, and in no acute distress.  Neuro/Psych: Alert and oriented x3, extra-ocular muscles intact, able to move all 4 extremities.  Skin: Warm and dry, no rashes noted.  Respiratory: Not using accessory muscles, speaking in full sentences, trachea midline.  Cardiovascular: Pulses palpable, no extremity edema. Abdomen: Does not appear distended. Back Exam:  Inspection: Unremarkable  Motion: Flexion 45 deg, Extension 45 deg, Side Bending to 45 deg bilaterally,  Rotation to 45 deg bilaterally  SLR laying: Negative  XSLR laying: Negative  Palpable tenderness: None. FABER: negative. Sensory change: Gross sensation intact to all lumbar  and sacral dermatomes.  Reflexes: 2+ at both patellar tendons, 2+ at achilles tendons, Babinski's downgoing.  Strength at foot  Plantar-flexion: 5/5 Dorsi-flexion: 5/5 Eversion: 5/5 Inversion: 5/5  Leg strength  Quad: 5/5 Hamstring: 5/5 Hip flexor: 5/5 Hip abductors: 5/5  Gait unremarkable.   Impression and Recommendations:   This case required medical decision making of moderate complexity.

## 2012-03-27 NOTE — Assessment & Plan Note (Addendum)
With burning in a left L5 type distribution. We'll start conservative, home therapy, prednisone, Neurontin. X-ray. She will come back to see me in 4 weeks, if no better we will obtain MRI for interventional injection planning.  Of note, she is somewhat strapped for money, so we are unable to pursue formal therapy, I do hope that she will be able to afford an MRI if the time comes.

## 2012-03-27 NOTE — Assessment & Plan Note (Signed)
Left groin pain 100% resolved since injection 3 weeks ago. He feels the similar symptoms are developing on the contralateral side, I advised her when she's ready I can inject this as well.

## 2012-04-03 ENCOUNTER — Telehealth: Payer: Self-pay | Admitting: *Deleted

## 2012-04-03 NOTE — Telephone Encounter (Signed)
Pt sttes you gave her tavatentin 300mg  and she read side effects and she refuses to take this. Pt states not bipolar not autistic and wants to know why you put her on this. Also on prednisone 5 tabs by mouth everyday for 5 days pt thinks this is too much so takes 2 a day in the am and that keeps her awake. Also wants to know results of xrays she had done

## 2012-04-03 NOTE — Telephone Encounter (Signed)
Tell her she can do whatever she wants, but gabapentin is a nerve blocking medication that is tried and true and has been around a long time and used for a long time for pinched nerves, which is what I think she has.  As for prednisone, no idea about this 5 tabs by mouth but my note and her rx should say 1 tab by mouth daily which is fairly low dose, if her rx says 5 tabs PO daily x 5d then that  XR are normal but that doesn't mean much since can't see discs on xray, she can see her results with Mychart, enrollment info is on the last page of her AVS.

## 2012-04-04 NOTE — Telephone Encounter (Signed)
Spoke with pt and gave her your instructions I answered more questions pt had about med gabapentin eased some concerns and pt will try the med.

## 2012-04-04 NOTE — Telephone Encounter (Signed)
Yes pt read bottle and said it said 5 a day. Pt will take prednisone 1 tab daily as you have instructed her to do.

## 2012-04-04 NOTE — Telephone Encounter (Signed)
Thanks, did it sound like the sig actually said 5 tabs a day or did she just get it wrong and think it was 5 tabs daily?

## 2012-05-14 ENCOUNTER — Ambulatory Visit (INDEPENDENT_AMBULATORY_CARE_PROVIDER_SITE_OTHER): Payer: BC Managed Care – PPO | Admitting: Sports Medicine

## 2012-05-14 ENCOUNTER — Encounter: Payer: Self-pay | Admitting: Sports Medicine

## 2012-05-14 VITALS — BP 135/89 | HR 74 | Wt 131.0 lb

## 2012-05-14 DIAGNOSIS — M169 Osteoarthritis of hip, unspecified: Secondary | ICD-10-CM

## 2012-05-14 DIAGNOSIS — M5416 Radiculopathy, lumbar region: Secondary | ICD-10-CM

## 2012-05-14 DIAGNOSIS — IMO0002 Reserved for concepts with insufficient information to code with codable children: Secondary | ICD-10-CM

## 2012-05-14 DIAGNOSIS — M1610 Unilateral primary osteoarthritis, unspecified hip: Secondary | ICD-10-CM

## 2012-05-14 NOTE — Progress Notes (Signed)
Subjective:    CC: Followup  HPI: Left hip pain: I performed an ultrasound guided intra-articular injection approximately 2 months ago. Pain continues to be 100% resolved, she also denies any pain in the right hip.  Lumbar radiculitis: Completely resolved with home exercises, gabapentin, prednisone.  Past medical history, Surgical history, Family history, Social history, Allergies, and medications have been entered into the medical record, reviewed, and no changes needed.   Review of Systems: No fevers, chills, night sweats, weight loss, chest pain, or shortness of breath.   Objective:    General: Well Developed, well nourished, and in no acute distress.  Neuro: Alert and oriented x3, extra-ocular muscles intact.  HEENT: Normocephalic, atraumatic, pupils equal round reactive to light, neck supple, no masses, no lymphadenopathy, thyroid nonpalpable.  Skin: Warm and dry, no rashes. Cardiac: Regular rate and rhythm, no murmurs rubs or gallops.  Respiratory: Clear to auscultation bilaterally. Not using accessory muscles, speaking in full sentences.  Impression and Recommendations:

## 2012-05-14 NOTE — Assessment & Plan Note (Signed)
Resolved after single intra-articular injection. She can come back to see me on an as-needed basis for this.

## 2012-05-14 NOTE — Assessment & Plan Note (Signed)
Resolved. May come back to see me on an as-needed basis.

## 2012-07-28 ENCOUNTER — Other Ambulatory Visit: Payer: Self-pay | Admitting: Family Medicine

## 2012-08-27 ENCOUNTER — Other Ambulatory Visit: Payer: Self-pay | Admitting: Family Medicine

## 2012-09-26 ENCOUNTER — Other Ambulatory Visit: Payer: Self-pay | Admitting: Family Medicine

## 2012-10-28 ENCOUNTER — Other Ambulatory Visit: Payer: Self-pay | Admitting: Family Medicine

## 2012-10-28 NOTE — Telephone Encounter (Signed)
Must make appointment 

## 2012-11-25 ENCOUNTER — Other Ambulatory Visit: Payer: Self-pay | Admitting: Family Medicine

## 2013-03-30 ENCOUNTER — Other Ambulatory Visit: Payer: Self-pay | Admitting: Family Medicine

## 2013-04-10 ENCOUNTER — Encounter: Payer: BC Managed Care – PPO | Admitting: Family Medicine

## 2013-04-15 ENCOUNTER — Ambulatory Visit (INDEPENDENT_AMBULATORY_CARE_PROVIDER_SITE_OTHER): Payer: Medicare Other | Admitting: Family Medicine

## 2013-04-15 ENCOUNTER — Encounter: Payer: Self-pay | Admitting: Family Medicine

## 2013-04-15 VITALS — BP 122/68 | HR 72 | Wt 122.0 lb

## 2013-04-15 DIAGNOSIS — Z1231 Encounter for screening mammogram for malignant neoplasm of breast: Secondary | ICD-10-CM

## 2013-04-15 DIAGNOSIS — Z0000 Encounter for general adult medical examination without abnormal findings: Secondary | ICD-10-CM

## 2013-04-15 DIAGNOSIS — H269 Unspecified cataract: Secondary | ICD-10-CM | POA: Diagnosis not present

## 2013-04-15 DIAGNOSIS — M810 Age-related osteoporosis without current pathological fracture: Secondary | ICD-10-CM | POA: Diagnosis not present

## 2013-04-15 DIAGNOSIS — I10 Essential (primary) hypertension: Secondary | ICD-10-CM

## 2013-04-15 MED ORDER — AMLODIPINE BESYLATE 10 MG PO TABS: 10.0000 mg | ORAL_TABLET | Freq: Every day | ORAL | Status: DC

## 2013-04-15 NOTE — Progress Notes (Deleted)
  Subjective:     Samantha Cross is a 67 y.o. female and is here for a comprehensive physical exam. The patient reports no problems.  History   Social History  . Marital Status: Married    Spouse Name: N/A    Number of Children: N/A  . 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     Social History Main Topics  . Smoking status: Never Smoker   . Smokeless tobacco: Not on file  . Alcohol Use: 1.8 oz/week    3 Glasses of wine per week     Comment: per week  . Drug Use: No  . Sexual Activity:      Comment: married, 2 grown children, 2 stepkids, no regular exercise, 3 beagles, working PT at daycare.   Other Topics Concern  . Not on file   Social History Narrative  . No narrative on file   Health Maintenance  Topic Date Due  . Mammogram  12/11/2012  . Influenza Vaccine  12/13/2013  . Colonoscopy  01/09/2021  . Tetanus/tdap  02/28/2022  . Zostavax  Completed    The following portions of the patient's history were reviewed and updated as appropriate: allergies, current medications, past family history, past medical history, past social history, past surgical history and problem list.  Review of Systems A comprehensive review of systems was negative.   Objective:    BP 122/68  Pulse 72  Wt 122 lb (55.339 kg)  SpO2 98% General appearance: alert, cooperative and appears stated age Head: Normocephalic, without obvious abnormality, atraumatic Eyes: conj clear,EOMi, PEERLA Ears: normal TM's and external ear canals both ears Nose: Nares normal. Septum midline. Mucosa normal. No drainage or sinus tenderness. Throat: lips, mucosa, and tongue normal; teeth and gums normal Neck: no adenopathy, no carotid bruit, no JVD, supple, symmetrical, trachea midline and thyroid not enlarged, symmetric, no tenderness/mass/nodules Back: symmetric, no curvature. ROM normal. No CVA tenderness. Lungs: clear to auscultation bilaterally Breasts: normal appearance, no  masses or tenderness Heart: regular rate and rhythm, S1, S2 normal, no murmur, click, rub or gallop Abdomen: soft, non-tender; bowel sounds normal; no masses,  no organomegaly Extremities: extremities normal, atraumatic, no cyanosis or edema Pulses: 2+ and symmetric Skin: Skin color, texture, turgor normal. No rashes or lesions Lymph nodes: Cervical, supraclavicular, and axillary nodes normal. Neurologic: Alert and oriented X 3, normal strength and tone. Normal symmetric reflexes. Normal coordination and gait    Assessment:    Healthy female exam.      Plan:     See After Visit Summary for Counseling Recommendations  Keep up a regular exercise program and make sure you are eating a healthy diet Try to eat 4 servings of dairy a day, or if you are lactose intolerant take a calcium with vitamin D daily.  Your vaccines are up to date.  Due for screening labs.

## 2013-04-15 NOTE — Progress Notes (Signed)
Subjective:    Samantha Cross is a 67 y.o. female who presents for a welcome to Medicare exam.   Cardiac risk factors: advanced age (older than 41 for men, 60 for women) and hypertension.  Activities of Daily Living  In your present state of health, do you have any difficulty performing the following activities?:  Preparing food and eating?: No Bathing yourself: No Getting dressed: No Using the toilet:No Moving around from place to place: No In the past year have you fallen or had a near fall?:No  Current exercise habits: walking and hunting   Dietary issues discussed: None   Depression Screen (Note: if answer to either of the following is "Yes", then a more complete depression screening is indicated)  Q1: Over the past two weeks, have you felt down, depressed or hopeless?no Q2: Over the past two weeks, have you felt little interest or pleasure in doing things? no   The following portions of the patient's history were reviewed and updated as appropriate: allergies, current medications, past family history, past medical history, past social history, past surgical history and problem list. Review of Systems A comprehensive review of systems was negative.    Objective:     Vision by Snellen chart: not performed. Shes plans on getting full eye exam  Blood pressure 122/68, pulse 72, weight 122 lb (55.339 kg), SpO2 98.00%. Body mass index is 21.27 kg/(m^2). BP 122/68  Pulse 72  Wt 122 lb (55.339 kg)  SpO2 98%  General Appearance:    Alert, cooperative, no distress, appears stated age  Head:    Normocephalic, without obvious abnormality, atraumatic  Eyes:    PERRL, conjunctiva/corneas clear, EOM's intact, both eyes  Ears:    Normal TM's and external ear canals, both ears  Nose:   Nares normal, septum midline, mucosa normal, no drainage    or sinus tenderness  Throat:   Lips, mucosa, and tongue normal; teeth and gums normal  Neck:   Supple, symmetrical, trachea midline, no  adenopathy;    thyroid:  no enlargement/tenderness/nodules; no carotid   bruit or JVD  Back:     Symmetric, no curvature, ROM normal, no CVA tenderness  Lungs:     Clear to auscultation bilaterally, respirations unlabored  Chest Wall:    No tenderness or deformity   Heart:    Regular rate and rhythm, S1 and S2 normal, no murmur, rub   or gallop  Breast Exam:    No tenderness, masses, or nipple abnormality  Abdomen:     Soft, non-tender, bowel sounds active all four quadrants,    no masses, no organomegaly  Genitalia:    Not examined  Rectal:    Not examined  Extremities:   Extremities normal, atraumatic, no cyanosis or edema  Pulses:   2+ and symmetric all extremities  Skin:   Skin color, texture, turgor normal, no rashes or lesions  Lymph nodes:   Cervical, supraclavicular, and axillary nodes normal  Neurologic:   CNII-XII intact, normal strength, sensation and reflexes    throughout      Assessment:     Welcome to Medicare Exam      Plan:     During the course of the visit the patient was educated and counseled about appropriate screening and preventive services including:   Screening electrocardiogram  schedule yearly eye exam. She does wear lenses and has early cataracts  HTN- well controlled. F/U in 6 months. RF sent.   EKG shows rate of 54 bpm,  NSR, normal axis, no Q waves. No acute ST-T waves.    Patient Instructions (the written plan) was given to the patient.

## 2013-04-16 LAB — CBC WITH DIFFERENTIAL/PLATELET
Basophils Absolute: 0.1 10*3/uL (ref 0.0–0.1)
Basophils Relative: 1 % (ref 0–1)
Eosinophils Absolute: 0.1 10*3/uL (ref 0.0–0.7)
Eosinophils Relative: 2 % (ref 0–5)
HCT: 42 % (ref 36.0–46.0)
Hemoglobin: 14.3 g/dL (ref 12.0–15.0)
Lymphocytes Relative: 40 % (ref 12–46)
Lymphs Abs: 2.3 10*3/uL (ref 0.7–4.0)
MCH: 30.1 pg (ref 26.0–34.0)
MCHC: 34 g/dL (ref 30.0–36.0)
MCV: 88.4 fL (ref 78.0–100.0)
Monocytes Absolute: 0.5 10*3/uL (ref 0.1–1.0)
Monocytes Relative: 9 % (ref 3–12)
Neutro Abs: 2.8 10*3/uL (ref 1.7–7.7)
Neutrophils Relative %: 48 % (ref 43–77)
Platelets: 302 10*3/uL (ref 150–400)
RBC: 4.75 MIL/uL (ref 3.87–5.11)
RDW: 13.2 % (ref 11.5–15.5)
WBC: 5.8 10*3/uL (ref 4.0–10.5)

## 2013-04-16 LAB — COMPLETE METABOLIC PANEL WITH GFR
ALT: 10 U/L (ref 0–35)
AST: 15 U/L (ref 0–37)
Albumin: 4.6 g/dL (ref 3.5–5.2)
Alkaline Phosphatase: 70 U/L (ref 39–117)
BUN: 12 mg/dL (ref 6–23)
CO2: 26 mEq/L (ref 19–32)
Calcium: 9.5 mg/dL (ref 8.4–10.5)
Chloride: 105 mEq/L (ref 96–112)
Creat: 0.69 mg/dL (ref 0.50–1.10)
GFR, Est African American: >89 mL/min
GFR, Est Non African American: >89 mL/min
Glucose, Bld: 85 mg/dL (ref 70–99)
Potassium: 4 mEq/L (ref 3.5–5.3)
Sodium: 141 mEq/L (ref 135–145)
Total Bilirubin: 0.7 mg/dL (ref 0.3–1.2)
Total Protein: 7.6 g/dL (ref 6.0–8.3)

## 2013-04-16 LAB — LIPID PANEL
Cholesterol: 261 mg/dL — ABNORMAL HIGH (ref 0–200)
HDL: 89 mg/dL (ref >39)
LDL Cholesterol: 164 mg/dL — ABNORMAL HIGH (ref 0–99)
Total CHOL/HDL Ratio: 2.9 Ratio
Triglycerides: 42 mg/dL (ref <150)
VLDL: 8 mg/dL (ref 0–40)

## 2013-05-05 ENCOUNTER — Ambulatory Visit: Payer: Medicare Other

## 2013-05-05 ENCOUNTER — Other Ambulatory Visit: Payer: Medicare Other

## 2013-05-12 ENCOUNTER — Ambulatory Visit (INDEPENDENT_AMBULATORY_CARE_PROVIDER_SITE_OTHER): Payer: Medicare Other

## 2013-05-12 DIAGNOSIS — M810 Age-related osteoporosis without current pathological fracture: Secondary | ICD-10-CM

## 2013-05-12 DIAGNOSIS — Z1231 Encounter for screening mammogram for malignant neoplasm of breast: Secondary | ICD-10-CM | POA: Diagnosis not present

## 2013-05-14 DIAGNOSIS — H26019 Infantile and juvenile cortical, lamellar, or zonular cataract, unspecified eye: Secondary | ICD-10-CM | POA: Diagnosis not present

## 2013-05-14 DIAGNOSIS — H2510 Age-related nuclear cataract, unspecified eye: Secondary | ICD-10-CM | POA: Diagnosis not present

## 2013-06-10 DIAGNOSIS — H26019 Infantile and juvenile cortical, lamellar, or zonular cataract, unspecified eye: Secondary | ICD-10-CM | POA: Diagnosis not present

## 2013-06-10 DIAGNOSIS — H2510 Age-related nuclear cataract, unspecified eye: Secondary | ICD-10-CM | POA: Diagnosis not present

## 2013-06-17 DIAGNOSIS — H2510 Age-related nuclear cataract, unspecified eye: Secondary | ICD-10-CM | POA: Diagnosis not present

## 2013-06-17 DIAGNOSIS — H26019 Infantile and juvenile cortical, lamellar, or zonular cataract, unspecified eye: Secondary | ICD-10-CM | POA: Diagnosis not present

## 2013-06-24 DIAGNOSIS — H2510 Age-related nuclear cataract, unspecified eye: Secondary | ICD-10-CM | POA: Diagnosis not present

## 2013-06-24 DIAGNOSIS — H26019 Infantile and juvenile cortical, lamellar, or zonular cataract, unspecified eye: Secondary | ICD-10-CM | POA: Diagnosis not present

## 2013-11-03 ENCOUNTER — Ambulatory Visit (INDEPENDENT_AMBULATORY_CARE_PROVIDER_SITE_OTHER): Payer: Medicare Other | Admitting: Sports Medicine

## 2013-11-03 ENCOUNTER — Encounter: Payer: Self-pay | Admitting: Sports Medicine

## 2013-11-03 VITALS — BP 137/81 | HR 63 | Ht 63.0 in | Wt 139.0 lb

## 2013-11-03 DIAGNOSIS — M1610 Unilateral primary osteoarthritis, unspecified hip: Secondary | ICD-10-CM | POA: Diagnosis not present

## 2013-11-03 DIAGNOSIS — M5416 Radiculopathy, lumbar region: Secondary | ICD-10-CM

## 2013-11-03 DIAGNOSIS — M169 Osteoarthritis of hip, unspecified: Secondary | ICD-10-CM | POA: Diagnosis not present

## 2013-11-03 DIAGNOSIS — M810 Age-related osteoporosis without current pathological fracture: Secondary | ICD-10-CM | POA: Diagnosis not present

## 2013-11-03 DIAGNOSIS — IMO0002 Reserved for concepts with insufficient information to code with codable children: Secondary | ICD-10-CM

## 2013-11-03 MED ORDER — GABAPENTIN 300 MG PO CAPS: ORAL_CAPSULE | ORAL | Status: DC

## 2013-11-03 NOTE — Assessment & Plan Note (Signed)
Left femoral acetabular joint injection as above. Last injection lasted for 19 months. Return in a month.

## 2013-11-03 NOTE — Progress Notes (Signed)
  Subjective:    CC: Followup recurrent hip pain  HPI: Left hip osteoarthritis: Last injection was 19 months ago, desires a repeat, does have recurrent pain in the left groin.  Left lumbar radiculitis: Moderate, persistent, radiates down the left leg in an L5 distribution, she tells me that my previously prescribed gabapentin is tremendously effective, and she needs a refill.  Osteoporosis: Has been resistant to bisphosphonates in the past after an episode of of an allergic reaction. She was unaware of other non-bisphosphonate medications for this.  Past medical history, Surgical history, Family history not pertinant except as noted below, Social history, Allergies, and medications have been entered into the medical record, reviewed, and no changes needed.   Review of Systems: No fevers, chills, night sweats, weight loss, chest pain, or shortness of breath.   Objective:    General: Well Developed, well nourished, and in no acute distress.  Neuro: Alert and oriented x3, extra-ocular muscles intact, sensation grossly intact.  HEENT: Normocephalic, atraumatic, pupils equal round reactive to light, neck supple, no masses, no lymphadenopathy, thyroid nonpalpable.  Skin: Warm and dry, no rashes. Cardiac: Regular rate and rhythm, no murmurs rubs or gallops, no lower extremity edema.  Respiratory: Clear to auscultation bilaterally. Not using accessory muscles, speaking in full sentences. Left Hip: ROM IR: 15Deg, ER: 45 Deg, Flexion: 120 Deg, Extension: 100 Deg, Abduction: 45 Deg, Adduction: 45 Deg, internal rotation reproduces pain in the groin. Strength IR: 5/5, ER: 5/5, Flexion: 5/5, Extension: 5/5, Abduction: 5/5, Adduction: 5/5 Pelvic alignment unremarkable to inspection and palpation. Standing hip rotation and gait without trendelenburg sign / unsteadiness. Greater trochanter without tenderness to palpation. No tenderness over piriformis. No pain with FABER or FADIR. No SI joint  tenderness and normal minimal SI movement.  Procedure: Real-time Ultrasound Guided Injection of left femoral acetabular joint Device: GE Logiq E  Verbal informed consent obtained.  Time-out conducted.  Noted no overlying erythema, induration, or other signs of local infection.  Skin prepped in a sterile fashion.  Local anesthesia: Topical Ethyl chloride.  With sterile technique and under real time ultrasound guidance:  Spinal needle advanced to the femoral head/neck junction, 2 cc Kenalog 40, 4 cc lidocaine injected easily. Completed without difficulty  Pain immediately resolved suggesting accurate placement of the medication.  Advised to call if fevers/chills, erythema, induration, drainage, or persistent bleeding.  Images permanently stored and available for review in the ultrasound unit.  Impression: Technically successful ultrasound guided injection.  Impression and Recommendations:

## 2013-11-03 NOTE — Assessment & Plan Note (Signed)
Also having some left-sided L4 versus L5 radicular symptoms treated He started gabapentin, I will refill this. If no better in a month we can consider MRI for interventional planning.

## 2013-11-03 NOTE — Patient Instructions (Signed)
Check into Prolia and Forteo. We can do Prolia here.

## 2013-11-03 NOTE — Assessment & Plan Note (Signed)
We did discuss the risks and benefits of osteoporosis treatment. At this point would recommend either Forteo or Prolia. She's going to do some research on these medications and let us know which one she like to choose. She did have allergic reaction to a bisphosphonate, we will avoid this class. She will followup with her PCP regarding this.

## 2014-04-26 ENCOUNTER — Other Ambulatory Visit: Payer: Self-pay | Admitting: Family Medicine

## 2014-05-05 ENCOUNTER — Other Ambulatory Visit: Payer: Self-pay | Admitting: Family Medicine

## 2014-08-03 ENCOUNTER — Other Ambulatory Visit: Payer: Self-pay | Admitting: Family Medicine

## 2014-08-12 ENCOUNTER — Other Ambulatory Visit: Payer: Self-pay

## 2014-08-12 MED ORDER — AMLODIPINE BESYLATE 10 MG PO TABS: 10.0000 mg | ORAL_TABLET | Freq: Every day | ORAL | Status: DC

## 2014-09-03 ENCOUNTER — Ambulatory Visit: Payer: Medicare Other | Admitting: Family Medicine

## 2014-09-08 ENCOUNTER — Encounter: Payer: Self-pay | Admitting: Family Medicine

## 2014-09-08 ENCOUNTER — Ambulatory Visit (INDEPENDENT_AMBULATORY_CARE_PROVIDER_SITE_OTHER): Payer: Medicare Other | Admitting: Family Medicine

## 2014-09-08 VITALS — BP 115/66 | HR 66 | Ht 63.0 in | Wt 145.0 lb

## 2014-09-08 DIAGNOSIS — R21 Rash and other nonspecific skin eruption: Secondary | ICD-10-CM

## 2014-09-08 DIAGNOSIS — M26629 Arthralgia of temporomandibular joint, unspecified side: Secondary | ICD-10-CM

## 2014-09-08 DIAGNOSIS — I10 Essential (primary) hypertension: Secondary | ICD-10-CM | POA: Diagnosis not present

## 2014-09-08 DIAGNOSIS — M2662 Arthralgia of temporomandibular joint: Secondary | ICD-10-CM | POA: Diagnosis not present

## 2014-09-08 NOTE — Progress Notes (Signed)
   Subjective:    Patient ID: Samantha Cross, female    DOB: Jun 13, 1945, 69 y.o.   MRN: [id number]  HPI  Hypertension- Pt denies chest pain, SOB, dizziness, or heart palpitations.  Taking meds as directed w/o problems.  Denies medication side effects.    Says has had some pain over the left jaw and raditaes into the jaw.  No cracking or popping. No drinage or fever. Started weeks ago.    Rash- was bitten by a tick a couple of weeks ago.  It was under the left bra strap.  Noticed a rash on top of the left shoulder about a week or so ago.  It hasn't gotten larger really change. It's not been itchy. The couple bites no the left bra strap has been itchy. She also noticed a little raised bump behind her right ear that the little smaller. She's not been putting anything on it. 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   Review of Systems     Objective:   Physical Exam  Constitutional: She is oriented to person, place, and time. She appears well-developed and well-nourished.  HENT:  Head: Normocephalic and atraumatic.  Right Ear: External ear normal.  Left Ear: External ear normal.  Nose: Nose normal.  Mouth/Throat: Oropharynx is clear and moist.  TMs are opaque bilaterally. Unable to see the vesicles. No erythema.  Eyes: Conjunctivae and EOM are normal. Pupils are equal, round, and reactive to light.  Neck: Neck supple. No thyromegaly present.  Cardiovascular: Normal rate, regular rhythm and normal heart sounds.   Pulmonary/Chest: Effort normal and breath sounds normal. She has no wheezes.  Lymphadenopathy:    She has no cervical adenopathy.  Neurological: She is alert and oriented to person, place, and time.  Skin: Skin is warm and dry.  20 x 12 mm oval raised red area on the left superior shoulder.  She has a smaller approximately 10 mm oval not nearly as red lesion but raised behind the right ear.  Psychiatric: She  has a normal mood and affect.          Assessment & Plan:  HTN - well controlled.  Continue current regimen.  F/U in 6 months.  Due for C<P and lipids.   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 She has been clenching at night and says this is new as well. I encouraged her to follow-up with her dentist to discuss a mouth bite guard. This may be contributing.  Rash-KOH performed. Will call with results once available. We'll likely treat with topical steroid of KOH is negative. If not improving after 2 weeks and will need biopsy.

## 2014-09-09 LAB — COMPLETE METABOLIC PANEL WITH GFR
ALT: 13 U/L (ref 0–35)
AST: 14 U/L (ref 0–37)
Albumin: 4 g/dL (ref 3.5–5.2)
Alkaline Phosphatase: 66 U/L (ref 39–117)
BUN: 13 mg/dL (ref 6–23)
CO2: 27 mEq/L (ref 19–32)
Calcium: 9.1 mg/dL (ref 8.4–10.5)
Chloride: 104 mEq/L (ref 96–112)
Creat: 0.69 mg/dL (ref 0.50–1.10)
GFR, Est African American: >89 mL/min
GFR, Est Non African American: >89 mL/min
Glucose, Bld: 94 mg/dL (ref 70–99)
Potassium: 4.1 mEq/L (ref 3.5–5.3)
Sodium: 140 mEq/L (ref 135–145)
Total Bilirubin: 0.6 mg/dL (ref 0.2–1.2)
Total Protein: 7.3 g/dL (ref 6.0–8.3)

## 2014-09-09 LAB — KOH PREP: RESULT - KOH: NONE SEEN

## 2014-09-09 LAB — LIPID PANEL
Cholesterol: 248 mg/dL — ABNORMAL HIGH (ref 0–200)
HDL: 73 mg/dL (ref >=46)
LDL Cholesterol: 162 mg/dL — ABNORMAL HIGH (ref 0–99)
Total CHOL/HDL Ratio: 3.4 Ratio
Triglycerides: 64 mg/dL (ref <150)
VLDL: 13 mg/dL (ref 0–40)

## 2014-09-12 ENCOUNTER — Other Ambulatory Visit: Payer: Self-pay | Admitting: Family Medicine

## 2014-09-13 ENCOUNTER — Other Ambulatory Visit: Payer: Self-pay | Admitting: Physician Assistant

## 2014-09-13 MED ORDER — SIMVASTATIN 20 MG PO TABS: 20.0000 mg | ORAL_TABLET | Freq: Every day | ORAL | Status: DC

## 2015-03-07 ENCOUNTER — Other Ambulatory Visit: Payer: Self-pay | Admitting: Physician Assistant

## 2015-03-07 ENCOUNTER — Other Ambulatory Visit: Payer: Self-pay | Admitting: Family Medicine

## 2015-03-15 ENCOUNTER — Ambulatory Visit (INDEPENDENT_AMBULATORY_CARE_PROVIDER_SITE_OTHER): Payer: Medicare Other | Admitting: Osteopathic Medicine

## 2015-03-15 ENCOUNTER — Encounter: Payer: Self-pay | Admitting: Osteopathic Medicine

## 2015-03-15 VITALS — BP 126/66 | HR 66 | Ht 63.0 in | Wt 148.0 lb

## 2015-03-15 DIAGNOSIS — L237 Allergic contact dermatitis due to plants, except food: Secondary | ICD-10-CM

## 2015-03-15 DIAGNOSIS — L299 Pruritus, unspecified: Secondary | ICD-10-CM | POA: Diagnosis not present

## 2015-03-15 DIAGNOSIS — L259 Unspecified contact dermatitis, unspecified cause: Secondary | ICD-10-CM | POA: Diagnosis not present

## 2015-03-15 MED ORDER — PREDNISONE 10 MG (48) PO TBPK: ORAL_TABLET | Freq: Every day | ORAL | Status: DC

## 2015-03-15 MED ORDER — METHYLPREDNISOLONE SODIUM SUCC 125 MG IJ SOLR
125.0000 mg | Freq: Once | INTRAMUSCULAR | Status: AC
  Administered 2015-03-15: 125 mg via INTRAMUSCULAR

## 2015-03-15 MED ORDER — HYDROXYZINE HCL 25 MG PO TABS: 25.0000 mg | ORAL_TABLET | Freq: Three times a day (TID) | ORAL | Status: DC | PRN

## 2015-03-15 NOTE — Progress Notes (Signed)
HPI: Samantha Cross is a 69 y.o. female who presents to [facility]  today for chief complaint of:  Chief Complaint  Patient presents with  . Poison Ivy    for about 2 weeks    . Location: arms, chest lower legs . Quality: ithcing, scaling . Severity: severe . Duration: 1 week . Context: thinks her dog got into some poison ivy outside and transferred to her  . Modifying factors: history of hand lotions, minimal effect, skin is dry.  . Assoc signs/symptoms: No fever/chills, no joint pain.   Past medical, social and family history reviewed: Past Medical History  Diagnosis Date  . Hypertension   . Hyperlipidemia   . Postmenopausal   . Hemorrhoids   . Diverticulosis    Past Surgical History  Procedure Laterality Date  . Abdominal hysterectomy      without BSO/heavy periods  . Cleft palate repair    . Middle ear surgery      left   Social History  Substance Use Topics  . Smoking status: Never Smoker   . Smokeless tobacco: Not on file  . Alcohol Use: 1.8 oz/week    3 Glasses of wine per week     Comment: per week   Family History  Problem Relation Age of Onset  . Cancer Mother 17    brain tumor  . Cancer Brother     brain tumor/gliobastoma, multiforme  . Heart attack Brother 25  . Cancer Brother     renal cell carcinoma, colon ca  . Heart attack Father 93    died/CVA  . Hyperlipidemia      Current Outpatient Prescriptions  Medication Sig Dispense Refill  . amLODipine (NORVASC) 10 MG tablet Take 1 tablet (10 mg total) by mouth daily. Due for follow up appointment. 30 tablet 1  . RED YEAST RICE EXTRACT PO Take 1 tablet by mouth daily.    . simvastatin (ZOCOR) 20 MG tablet Take 1 tablet (20 mg total) by mouth daily at 6 PM. Patient needs office appointment for more refills 30 tablet 0   No current facility-administered medications for this visit.   Allergies  Allergen Reactions  . Lisinopril     Tongue swelling  .  Alendronate [Alendronate] Other (See Comments)    Chest pain, palpitations.       Review of Systems: CONSTITUTIONAL: Neg fever/chills, no unintentional weight changes CARDIAC: No chest pain/pressure/ RESPIRATORY: No cough/shortness of breath/wheeze MUSCULOSKELETAL: No myalgia/arthralgia SKIN: Diffuse, itching rash on arms or legs chest, see history of present illness  HEM/ONC: No easy bruising/bleeding, no abnormal lymph node  Exam:  BP 126/66 mmHg  Pulse 66  Ht 5\' 3"  (1.6 m)  Wt 148 lb (67.132 kg)  BMI 26.22 kg/m2 Constitutional: VSS, see above. General Appearance: alert, well-developed, well-nourished, NAD skin: Maculopapular, mildly scaling rash on bilateral forearms, lower legs, upper chest, no vesicles, mild edema  diffuse/subcutaneous   No results found for this or any previous visit (from the past 72 hour(s)).   ASSESSMENT/PLAN:  Poison ivy dermatitis - Plan: methylPREDNISolone sodium succinate (SOLU-MEDROL) 125 mg/2 mL injection 125 mg  Contact dermatitis - Plan: predniSONE (STERAPRED UNI-PAK 48 TAB) 10 MG (48) TBPK tablet  Itching - Plan: hydrOXYzine (ATARAX/VISTARIL) 25 MG tablet    return to clinic if no better in one week, over-the-counter lotions patient were recommended. Severe contact dermatitis reaction, if no better may consider biopsy for low suspicion for any rheumatologic process given the history nature  of the rash

## 2015-03-15 NOTE — Patient Instructions (Addendum)
If your skin is no better in the next week or so, come back in a need to do further evaluation of this rash. Try Cetaphil or Cerave lotion for skin moisturizer.   Vital

## 2015-03-16 ENCOUNTER — Telehealth: Payer: Self-pay | Admitting: *Deleted

## 2015-03-16 NOTE — Telephone Encounter (Signed)
Possible, but if she is worried she needs to make appt or see urgent care

## 2015-03-16 NOTE — Telephone Encounter (Signed)
Pt left vm this morning asking if the prednisone could raise her BP & cause bad headaches.  Please advise.

## 2015-03-16 NOTE — Telephone Encounter (Signed)
LMOM notifying pt of recommendations. 

## 2015-03-18 ENCOUNTER — Telehealth: Payer: Self-pay | Admitting: Family Medicine

## 2015-03-18 NOTE — Telephone Encounter (Signed)
Received fax for prior authorization on Hydroxyzine called [facility] and they are faxing over form I will fill out form and fax back when completed. - CF

## 2015-03-18 NOTE — Telephone Encounter (Deleted)
Received fax for prior authorization on Hydroxyzine I called [facility] and they are faxing over a form I will fill it out and fax when received. - CF

## 2015-04-07 ENCOUNTER — Other Ambulatory Visit: Payer: Self-pay | Admitting: Physician Assistant

## 2015-05-05 ENCOUNTER — Other Ambulatory Visit: Payer: Self-pay | Admitting: Family Medicine

## 2015-05-05 ENCOUNTER — Encounter: Payer: Self-pay | Admitting: Family Medicine

## 2015-05-05 ENCOUNTER — Ambulatory Visit (INDEPENDENT_AMBULATORY_CARE_PROVIDER_SITE_OTHER): Payer: Medicare Other | Admitting: Family Medicine

## 2015-05-05 VITALS — BP 124/76 | HR 65 | Temp 98.0°F | Resp 16 | Wt 150.3 lb

## 2015-05-05 DIAGNOSIS — Z800 Family history of malignant neoplasm of digestive organs: Secondary | ICD-10-CM | POA: Diagnosis not present

## 2015-05-05 DIAGNOSIS — I10 Essential (primary) hypertension: Secondary | ICD-10-CM

## 2015-05-05 DIAGNOSIS — E785 Hyperlipidemia, unspecified: Secondary | ICD-10-CM | POA: Diagnosis not present

## 2015-05-05 DIAGNOSIS — Z1211 Encounter for screening for malignant neoplasm of colon: Secondary | ICD-10-CM | POA: Diagnosis not present

## 2015-05-05 MED ORDER — SIMVASTATIN 20 MG PO TABS: ORAL_TABLET | ORAL | Status: DC

## 2015-05-05 NOTE — Progress Notes (Signed)
Subjective:    Patient ID: Samantha Cross, female    DOB: 1945-09-23, 69 y.o.   MRN: [id number]  HPI Hypertension- Pt denies chest pain, SOB, dizziness, or heart palpitations.  Taking meds as directed w/o problems.  Denies medication side effects.  Home Bps have been runnning in the 120-130s.    Hyperlipidemia - Started her statin about 6 months ago statin.  She denies any side effects including myalgias.she did stop her red yeast rice.    Review of Systems   BP 142/78 mmHg  Pulse 65  Temp(Src) 98 F (36.7 C)  Resp 16  Wt 150 lb 4.8 oz (68.176 kg)  SpO2 100%    Allergies  Allergen Reactions  . Lisinopril     Tongue swelling  . Alendronate [Alendronate] Other (See Comments)    Chest pain, palpitations.     Past Medical History  Diagnosis Date  . Hypertension   . Hyperlipidemia   . Postmenopausal   . Hemorrhoids   . Diverticulosis     Past Surgical History  Procedure Laterality Date  . Abdominal hysterectomy      without BSO/heavy periods  . Cleft palate repair    . Middle ear surgery      left    Social History   Social History  . Marital Status: Married    Spouse Name: N/A  . Number of Children: N/A  . 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     Social History Main Topics  . Smoking status: Never Smoker   . Smokeless tobacco: Not on file  . Alcohol Use: 1.8 oz/week    3 Glasses of wine per week     Comment: per week  . Drug Use: No  . Sexual Activity: Not on file     Comment: married, 2 grown children, 2 stepkids, no regular exercise, 3 beagles, working PT at daycare.   Other Topics Concern  . Not on file   Social History Narrative    Family History  Problem Relation Age of Onset  . Cancer Mother 49    brain tumor  . Cancer Brother     brain tumor/gliobastoma, multiforme  . Heart attack Brother 78  . Cancer Brother     renal cell carcinoma, colon ca  . Heart attack Father 70    died/CVA  . Hyperlipidemia     . Ovarian cancer Mother     Outpatient Encounter Prescriptions as of 05/05/2015  Medication Sig  . amLODipine (NORVASC) 10 MG tablet Take 1 tablet (10 mg total) by mouth daily. Due for follow up appointment.  . hydrOXYzine (ATARAX/VISTARIL) 25 MG tablet Take 1 tablet (25 mg total) by mouth 3 (three) times daily as needed for itching.  . RED YEAST RICE EXTRACT PO Take 1 tablet by mouth daily.  . simvastatin (ZOCOR) 20 MG tablet TAKE 1 TABLET BY MOUTH DAILY AT 6 PM.**NEEDS OFFICE APPOINTMENT FOR FURTHER REFILLS**  . [DISCONTINUED] predniSONE (STERAPRED UNI-PAK 48 TAB) 10 MG (48) TBPK tablet Take by mouth daily. 12-Day taper, po   No facility-administered encounter medications on file as of 05/05/2015.           Objective:   Physical Exam  Constitutional: She is oriented to person, place, and time. She appears well-developed and well-nourished.  HENT:  Head: Normocephalic and atraumatic.  Cardiovascular: Normal rate, regular rhythm and normal heart sounds.   Pulmonary/Chest: Effort normal and breath sounds normal.  Neurological: She  is alert and oriented to person, place, and time.  Skin: Skin is warm and dry.  Psychiatric: She has a normal mood and affect. Her behavior is normal.          Assessment & Plan:  Hypertension-well-controlled. We'll continue current regimen. Follow-up in 6 months.   hyperlipidemia-due to recheck lipids now that she has been on he statin and is tolerating it well. Hopefully the current dose will be effective.   family history of colon cancer. She has 2 first-degree relatives with colon cancer. Will call t get her most up-to-date report from [facility]. The last one we have on file was from 2012 but she thinks she had one since then. She would prefer to switch to the local GI office here in town [facility] if she is due for another scope.

## 2015-05-06 NOTE — Addendum Note (Signed)
Addended by: Elizabeth Sauer on: 05/06/2015 03:56 PM   Modules accepted: Orders

## 2015-06-21 DIAGNOSIS — Z86010 Personal history of colonic polyps: Secondary | ICD-10-CM | POA: Diagnosis not present

## 2015-06-21 DIAGNOSIS — D123 Benign neoplasm of transverse colon: Secondary | ICD-10-CM | POA: Diagnosis not present

## 2015-06-29 LAB — HM COLONOSCOPY

## 2015-06-30 ENCOUNTER — Encounter: Payer: Self-pay | Admitting: Family Medicine

## 2015-08-12 ENCOUNTER — Encounter: Payer: Self-pay | Admitting: *Deleted

## 2015-11-01 ENCOUNTER — Other Ambulatory Visit: Payer: Self-pay | Admitting: Family Medicine

## 2015-11-08 ENCOUNTER — Ambulatory Visit (INDEPENDENT_AMBULATORY_CARE_PROVIDER_SITE_OTHER): Payer: Medicare Other | Admitting: Family Medicine

## 2015-11-08 ENCOUNTER — Encounter: Payer: Self-pay | Admitting: Family Medicine

## 2015-11-08 VITALS — BP 125/78 | HR 60 | Wt 150.0 lb

## 2015-11-08 DIAGNOSIS — H8110 Benign paroxysmal vertigo, unspecified ear: Secondary | ICD-10-CM

## 2015-11-08 DIAGNOSIS — I10 Essential (primary) hypertension: Secondary | ICD-10-CM | POA: Diagnosis not present

## 2015-11-08 DIAGNOSIS — E785 Hyperlipidemia, unspecified: Secondary | ICD-10-CM | POA: Diagnosis not present

## 2015-11-08 DIAGNOSIS — Z1159 Encounter for screening for other viral diseases: Secondary | ICD-10-CM | POA: Diagnosis not present

## 2015-11-08 DIAGNOSIS — R21 Rash and other nonspecific skin eruption: Secondary | ICD-10-CM

## 2015-11-08 LAB — COMPLETE METABOLIC PANEL WITH GFR
ALT: 16 U/L (ref 6–29)
AST: 17 U/L (ref 10–35)
Albumin: 4.2 g/dL (ref 3.6–5.1)
Alkaline Phosphatase: 81 U/L (ref 33–130)
BUN: 11 mg/dL (ref 7–25)
CO2: 25 mmol/L (ref 20–31)
Calcium: 9.4 mg/dL (ref 8.6–10.4)
Chloride: 105 mmol/L (ref 98–110)
Creat: 0.69 mg/dL (ref 0.50–0.99)
GFR, Est African American: >89 mL/min (ref >=60)
GFR, Est Non African American: >89 mL/min (ref >=60)
Glucose, Bld: 97 mg/dL (ref 65–99)
Potassium: 4.3 mmol/L (ref 3.5–5.3)
Sodium: 140 mmol/L (ref 135–146)
Total Bilirubin: 0.8 mg/dL (ref 0.2–1.2)
Total Protein: 7.2 g/dL (ref 6.1–8.1)

## 2015-11-08 LAB — LIPID PANEL
Cholesterol: 205 mg/dL — ABNORMAL HIGH (ref 125–200)
HDL: 88 mg/dL (ref >=46)
LDL Cholesterol: 99 mg/dL (ref <130)
Total CHOL/HDL Ratio: 2.3 Ratio (ref <=5.0)
Triglycerides: 91 mg/dL (ref <150)
VLDL: 18 mg/dL (ref <30)

## 2015-11-08 MED ORDER — CLOBETASOL PROP EMOLLIENT BASE 0.05 % EX CREA: 1.0000 "application " | TOPICAL_CREAM | Freq: Two times a day (BID) | CUTANEOUS | Status: DC

## 2015-11-08 NOTE — Progress Notes (Signed)
Subjective:    CC: HTN  HPI:  Hypertension- Pt denies chest pain, SOB, dizziness, or heart palpitations.  Taking meds as directed w/o problems.  Denies medication side effects.  Currently on amlodipine 10 mg daily.  Hyperlipidemia-she's currently on simvastatin and red yeast rice.  Patient also complains of a rash on her left lower leg. She said originally she had been exposed to poison ivy and it called in and we have prescribed the medication. She said she only took a little bit of it because it made her feel sick so she stopped it but says the rash still has not cleared since then. She's been using antibiotic ointment on it for weeks. It can get itchy at times.  She also has what looks like an erythematous blister on her right Achilles tendon area on her posterior right foot. She wonders if it could be a spider bite. She says it is getting better and looking a little smaller. Next  She also complains of vertigo 4 months. She says it mostly bothers her at night when she goes to lay down or sometimes if she is changing position from sitting to standing etc. She says she has had some spring allergy symptoms but nothing severe.It just lasts a few seconds. She feels like it is comging from her ears.   Past medical history, Surgical history, Family history not pertinant except as noted below, Social history, Allergies, and medications have been entered into the medical record, reviewed, and corrections made.   Review of Systems: No fevers, chills, night sweats, weight loss, chest pain, or shortness of breath.   Objective:    General: Well Developed, well nourished, and in no acute distress.  Neuro: Alert and oriented x3, extra-ocular muscles intact, sensation grossly intact.  HEENT: Normocephalic, atraumatic Right tympanic membrane appears very opaque with loss of light reflex. Left TM is completely scarred and distorted. Oropharynx is clear. No lymphadenopathy Skin: Warm and dry, On her left  lower leg she has an erythematous maculopapular rash that starts just below the knee and continues to just above the ankle. Also on the right posterior heel just over the Achilles tendon she has an erythematous blister. Though she feels like it is healing. Cardiac: Regular rate and rhythm, no murmurs rubs or gallops, no lower extremity edema.  Respiratory: Clear to auscultation bilaterally. Not using accessory muscles, speaking in full sentences.   Impression and Recommendations:   HTN - Well controlled. Continue current regimen. Follow up in 44mo.   Hyperlipidemia- Due to recheck lipids and liver. Continue a statin and red yeast Rice. Will call for results once available.  Rash on left lower leg-skin scraping performed today. Suspect that she has a contact dermatitis from overuse of a topical antibacterial ointment. Encouraged her to stop this immediately and we'll switch to a topical steroid cream. Next  Blister on right posterior heel-gave reassurance. It seems to be healing and resolving on its own. Unclear if it may have been in a spider bite initially but it definitely has been scratched and inflamed. Encouraged her just to try not to scratch at it and let it continue to heal.  Vertigo-based on her description suspect either could be some mild vertigo from increased sinus pressure from allergic rhinitis versus positional vertigo. Given handout to do the home exercises and also recommend a trial of a nasal steroid spray. If not improving over the next 3 weeks and have her come back and we will perform a Optician, dispensing  at that time. Amount of patient due for screening mammogram.

## 2015-11-08 NOTE — Patient Instructions (Addendum)
For the dizziness I recommend using fluticasone nasal spray or Flonase over-the-counter. 2 sprays in each nostril. He can take 5-7 days to start working effectively. Also can do the exercises for vertigo at home as well. If not improving them please come back in.

## 2015-11-09 LAB — HEPATITIS C ANTIBODY: HCV Ab: NEGATIVE

## 2015-11-11 LAB — FUNGAL STAIN

## 2016-01-31 ENCOUNTER — Other Ambulatory Visit: Payer: Self-pay | Admitting: Family Medicine

## 2016-03-13 ENCOUNTER — Ambulatory Visit (INDEPENDENT_AMBULATORY_CARE_PROVIDER_SITE_OTHER): Payer: Medicare Other | Admitting: Sports Medicine

## 2016-03-13 ENCOUNTER — Ambulatory Visit (INDEPENDENT_AMBULATORY_CARE_PROVIDER_SITE_OTHER): Payer: Medicare Other

## 2016-03-13 ENCOUNTER — Encounter: Payer: Self-pay | Admitting: Sports Medicine

## 2016-03-13 DIAGNOSIS — M25511 Pain in right shoulder: Secondary | ICD-10-CM

## 2016-03-13 DIAGNOSIS — M5416 Radiculopathy, lumbar region: Secondary | ICD-10-CM

## 2016-03-13 MED ORDER — PREDNISONE 50 MG PO TABS: ORAL_TABLET | ORAL | 0 refills | Status: DC

## 2016-03-13 NOTE — Progress Notes (Addendum)
   Subjective:    I'm seeing this patient as a consultation for:  Dr. Beatrice Lecher  CC: Right anterior shoulder pain  HPI: This is a pleasant 70 year old female, yesterday she lifted something heavy and had immediate pain that she localized to her sternoclavicular joint. Nothing radicular down the arm.  She also has a recurrence of back pain, moderate, localized at the left SI joint, history of left lumbar radiculopathy.  Past medical history:  Negative.  See flowsheet/record as well for more information.  Surgical history: Negative.  See flowsheet/record as well for more information.  Family history: Negative.  See flowsheet/record as well for more information.  Social history: Negative.  See flowsheet/record as well for more information.  Allergies, and medications have been entered into the medical record, reviewed, and no changes needed.   Review of Systems: No headache, visual changes, nausea, vomiting, diarrhea, constipation, dizziness, abdominal pain, skin rash, fevers, chills, night sweats, weight loss, swollen lymph nodes, body aches, joint swelling, muscle aches, chest pain, shortness of breath, mood changes, visual or auditory hallucinations.   Objective:   General: Well Developed, well nourished, and in no acute distress.  Neuro/Psych: Alert and oriented x3, extra-ocular muscles intact, able to move all 4 extremities, sensation grossly intact. Skin: Warm and dry, no rashes noted.  Respiratory: Not using accessory muscles, speaking in full sentences, trachea midline.  Cardiovascular: Pulses palpable, no extremity edema. Abdomen: Does not appear distended. Neck: Negative spurling's Full neck range of motion Grip strength and sensation normal in bilateral hands Strength good C4 to T1 distribution No sensory change to C4 to T1 Reflexes normal Right Shoulder: Inspection reveals no abnormalities, atrophy or asymmetry. Reproduction of pain with crossarm test and tender  over the sternoclavicular joint. ROM is full in all planes. Rotator cuff strength normal throughout. No signs of impingement with negative Neer and Hawkin's tests, empty can. Speeds and Yergason's tests normal. No labral pathology noted with negative Obrien's, negative crank, negative clunk, and good stability. Normal scapular function observed. No painful arc and no drop arm sign. No apprehension sign Back Exam:  Inspection: Unremarkable  Motion: Flexion 45 deg, Extension 45 deg, Side Bending to 45 deg bilaterally,  Rotation to 45 deg bilaterally  SLR laying: Negative  XSLR laying: Negative  Palpable tenderness: Left SI joint. FABER: negative. Sensory change: Gross sensation intact to all lumbar and sacral dermatomes.  Reflexes: 2+ at both patellar tendons, 2+ at achilles tendons, Babinski's downgoing.  Strength at foot  Plantar-flexion: 5/5 Dorsi-flexion: 5/5 Eversion: 5/5 Inversion: 5/5  Leg strength  Quad: 5/5 Hamstring: 5/5 Hip flexor: 5/5 Hip abductors: 5/5  Gait unremarkable.  X-rays personally reviewed and show acromioclavicular degenerative changes.  Impression and Recommendations:   This case required medical decision making of moderate complexity.  Right shoulder pain Pain is referable to the right sternoclavicular joint after trying to lift a heavy object. We will start conservatively, x-rays, prednisone. Nothing overtly radicular. Return to see me if no better in one to 2 weeks.  Left lumbar radiculitis With a recurrence of pain at the left SI joint, overall did well with conservative measures initially. We will probably do an SI joint injection versus ordering an MRI when she returns if she still has her back pain. In the past she did have left L4 and L5 radicular type symptoms.

## 2016-03-13 NOTE — Assessment & Plan Note (Signed)
Pain is referable to the right sternoclavicular joint after trying to lift a heavy object. We will start conservatively, x-rays, prednisone. Nothing overtly radicular. Return to see me if no better in one to 2 weeks.

## 2016-03-13 NOTE — Assessment & Plan Note (Signed)
With a recurrence of pain at the left SI joint, overall did well with conservative measures initially. We will probably do an SI joint injection versus ordering an MRI when she returns if she still has her back pain. In the past she did have left L4 and L5 radicular type symptoms.

## 2016-03-27 ENCOUNTER — Encounter: Payer: Self-pay | Admitting: Sports Medicine

## 2016-03-27 ENCOUNTER — Ambulatory Visit (INDEPENDENT_AMBULATORY_CARE_PROVIDER_SITE_OTHER): Payer: Medicare Other | Admitting: Sports Medicine

## 2016-03-27 DIAGNOSIS — M25511 Pain in right shoulder: Secondary | ICD-10-CM | POA: Diagnosis not present

## 2016-03-27 DIAGNOSIS — H74392 Other acquired abnormalities of left ear ossicles: Secondary | ICD-10-CM | POA: Insufficient documentation

## 2016-03-27 DIAGNOSIS — M5416 Radiculopathy, lumbar region: Secondary | ICD-10-CM | POA: Diagnosis not present

## 2016-03-27 DIAGNOSIS — G8929 Other chronic pain: Secondary | ICD-10-CM

## 2016-03-27 NOTE — Progress Notes (Signed)
  Subjective:    CC: Follow-up  HPI: Ear issues: Needs to discuss this with her PCP, she is agreeable simply to do an ENT referral, she has had some extensive left ear and tympanic membrane surgery.  Shoulder pain: Resolved  Back pain: Essentially resolved, no pain today, when she gets it it is mild, discogenic and radiates down the left leg in an L5 distribution.  Past medical history:  Negative.  See flowsheet/record as well for more information.  Surgical history: Negative.  See flowsheet/record as well for more information.  Family history: Negative.  See flowsheet/record as well for more information.  Social history: Negative.  See flowsheet/record as well for more information.  Allergies, and medications have been entered into the medical record, reviewed, and no changes needed.   Review of Systems: No fevers, chills, night sweats, weight loss, chest pain, or shortness of breath.   Objective:    General: Well Developed, well nourished, and in no acute distress.  Neuro: Alert and oriented x3, extra-ocular muscles intact, sensation grossly intact.  HEENT: Normocephalic, atraumatic, pupils equal round reactive to light, neck supple, no masses, no lymphadenopathy, thyroid nonpalpable.  Skin: Warm and dry, no rashes. Cardiac: Regular rate and rhythm, no murmurs rubs or gallops, no lower extremity edema.  Respiratory: Clear to auscultation bilaterally. Not using accessory muscles, speaking in full sentences.  Impression and Recommendations:    Right shoulder pain Sternoclavicular pain has resolved with conservative measures, prednisone. Return as needed for this.  Left lumbar radiculitis Pain today is more radicular, and discogenic. However she is not actually hurting today, she has gabapentin that she can use as needed. If persistence despite gabapentin and rehabilitation then we will proceed with an MRI for interventional planning, she does seem have L5 radicular  symptoms.  Acquired abnormality of left ear ossicles Referral to ENT  I spent 25 minutes with this patient, greater than 50% was face-to-face time counseling regarding the above diagnoses

## 2016-03-27 NOTE — Assessment & Plan Note (Signed)
Referral to ENT °

## 2016-03-27 NOTE — Assessment & Plan Note (Signed)
Pain today is more radicular, and discogenic. However she is not actually hurting today, she has gabapentin that she can use as needed. If persistence despite gabapentin and rehabilitation then we will proceed with an MRI for interventional planning, she does seem have L5 radicular symptoms.

## 2016-03-27 NOTE — Assessment & Plan Note (Signed)
Sternoclavicular pain has resolved with conservative measures, prednisone. Return as needed for this.

## 2016-04-12 DIAGNOSIS — Z9889 Other specified postprocedural states: Secondary | ICD-10-CM | POA: Diagnosis not present

## 2016-04-12 DIAGNOSIS — Q378 Unspecified cleft palate with bilateral cleft lip: Secondary | ICD-10-CM | POA: Diagnosis not present

## 2016-04-12 DIAGNOSIS — H8111 Benign paroxysmal vertigo, right ear: Secondary | ICD-10-CM | POA: Diagnosis not present

## 2016-04-18 DIAGNOSIS — H8111 Benign paroxysmal vertigo, right ear: Secondary | ICD-10-CM | POA: Diagnosis not present

## 2016-05-08 ENCOUNTER — Other Ambulatory Visit: Payer: Self-pay | Admitting: Family Medicine

## 2016-05-09 ENCOUNTER — Ambulatory Visit (INDEPENDENT_AMBULATORY_CARE_PROVIDER_SITE_OTHER): Payer: Medicare Other | Admitting: Family Medicine

## 2016-05-09 ENCOUNTER — Encounter: Payer: Self-pay | Admitting: Family Medicine

## 2016-05-09 ENCOUNTER — Ambulatory Visit (INDEPENDENT_AMBULATORY_CARE_PROVIDER_SITE_OTHER): Payer: Medicare Other

## 2016-05-09 VITALS — BP 130/65 | HR 66 | Ht 63.0 in | Wt 152.0 lb

## 2016-05-09 DIAGNOSIS — R21 Rash and other nonspecific skin eruption: Secondary | ICD-10-CM

## 2016-05-09 DIAGNOSIS — L820 Inflamed seborrheic keratosis: Secondary | ICD-10-CM | POA: Diagnosis not present

## 2016-05-09 DIAGNOSIS — I10 Essential (primary) hypertension: Secondary | ICD-10-CM

## 2016-05-09 DIAGNOSIS — D229 Melanocytic nevi, unspecified: Secondary | ICD-10-CM | POA: Diagnosis not present

## 2016-05-09 DIAGNOSIS — Z1231 Encounter for screening mammogram for malignant neoplasm of breast: Secondary | ICD-10-CM

## 2016-05-09 DIAGNOSIS — D224 Melanocytic nevi of scalp and neck: Secondary | ICD-10-CM | POA: Diagnosis not present

## 2016-05-09 MED ORDER — CLOBETASOL PROP EMOLLIENT BASE 0.05 % EX CREA: 1.0000 "application " | TOPICAL_CREAM | Freq: Two times a day (BID) | CUTANEOUS | 99 refills | Status: DC

## 2016-05-09 NOTE — Patient Instructions (Signed)
Remove bandaged more morning. Okay to get wet in the shower. Patent dry and apply Vaseline. Do not use any type of alcohol or peroxide product on it. Can cover loosely with gauze or Band-Aid for a couple of days if needed but you do not have to. Call if any sign of infection. We will call with the pathology report once available. Typically a week to get result.

## 2016-05-09 NOTE — Progress Notes (Signed)
Subjective:    CC: HTN  HPI: Hypertension- Pt denies chest pain, SOB, dizziness, or heart palpitations.  Taking meds as directed w/o problems.  Denies medication side effects.    She does have a mole on her right upper neck or the shoulder and the neck meat. She would like to have it removed. Her chains and jewelry catch on it and it becomes irritated and painful.   Past medical history, Surgical history, Family history not pertinant except as noted below, Social history, Allergies, and medications have been entered into the medical record, reviewed, and corrections made.   Review of Systems: No fevers, chills, night sweats, weight loss, chest pain, or shortness of breath.   Objective:    General: Well Developed, well nourished, and in no acute distress.  Neuro: Alert and oriented x3, extra-ocular muscles intact, sensation grossly intact.  HEENT: Normocephalic, atraumatic  Skin: Warm and dry, no rashes.Atypical nevus that is perfectly smooth and round at the base of the right neck posterior right shoulder. No sign of infection or erythema. Cardiac: Regular rate and rhythm, no murmurs rubs or gallops, no lower extremity edema.  Respiratory: Clear to auscultation bilaterally. Not using accessory muscles, speaking in full sentences.   Impression and Recommendations:   HTN - Well controlled. Continue current regimen. Follow up in  6 months.   Atypical nevus-shave biopsy performed. Patient tolerated well. Follow-up wound care discussed.  She declines pneumonia vaccine and declines flu vaccine today.  She is scheduled for mammogram this morning.vc    Shave Biopsy Procedure Note  Pre-operative Diagnosis: atypical nevus  Post-operative Diagnosis: same  Locations:right base of neck  Indications: irritation  Anesthesia: Lidocaine 1% with epinephrine without added sodium bicarbonate  Procedure Details  History of allergy to iodine: no  Patient informed of the risks (including  bleeding and infection) and benefits of the  procedure and Verbal informed consent obtained.  The lesion and surrounding area were given a sterile prep using chlorhexidine and draped in the usual sterile fashion. A scalpel was used to shave an area of skin approximately 76mm by 38mm.  Hemostasis achieved with alumuninum chloride. Antibiotic ointment and a sterile dressing applied.  The specimen was sent for pathologic examination. The patient tolerated the procedure well.  EBL: 0 ml  Findings: Await pathology  Condition: Stable  Complications: none.  Plan: 1. Instructed to keep the wound dry and covered for 24-48h and clean thereafter. 2. Warning signs of infection were reviewed.   3. Recommended that the patient use OTC acetaminophen as needed for pain.  4. Return PRN.

## 2016-05-09 NOTE — Addendum Note (Signed)
Addended by: Teddy Spike on: 05/09/2016 03:54 PM   Modules accepted: Orders

## 2016-08-01 ENCOUNTER — Other Ambulatory Visit: Payer: Self-pay | Admitting: Physician Assistant

## 2016-08-11 ENCOUNTER — Telehealth: Payer: Self-pay | Admitting: Family Medicine

## 2016-08-11 NOTE — Telephone Encounter (Signed)
LM for pt to sch AWV, mn ° °

## 2016-10-09 ENCOUNTER — Encounter: Payer: Self-pay | Admitting: Family Medicine

## 2016-10-09 ENCOUNTER — Ambulatory Visit (INDEPENDENT_AMBULATORY_CARE_PROVIDER_SITE_OTHER): Payer: Medicare Other | Admitting: Family Medicine

## 2016-10-09 VITALS — BP 124/72 | HR 63 | Wt 151.0 lb

## 2016-10-09 DIAGNOSIS — M25511 Pain in right shoulder: Secondary | ICD-10-CM | POA: Diagnosis not present

## 2016-10-09 DIAGNOSIS — Z6827 Body mass index (BMI) 27.0-27.9, adult: Secondary | ICD-10-CM

## 2016-10-09 DIAGNOSIS — I10 Essential (primary) hypertension: Secondary | ICD-10-CM

## 2016-10-09 DIAGNOSIS — E7800 Pure hypercholesterolemia, unspecified: Secondary | ICD-10-CM

## 2016-10-09 LAB — COMPLETE METABOLIC PANEL WITH GFR
ALT: 15 U/L (ref 6–29)
AST: 17 U/L (ref 10–35)
Albumin: 4.5 g/dL (ref 3.6–5.1)
Alkaline Phosphatase: 80 U/L (ref 33–130)
BUN: 12 mg/dL (ref 7–25)
CO2: 22 mmol/L (ref 20–31)
Calcium: 9.8 mg/dL (ref 8.6–10.4)
Chloride: 105 mmol/L (ref 98–110)
Creat: 0.72 mg/dL (ref 0.50–0.99)
GFR, Est African American: >89 mL/min (ref >=60)
GFR, Est Non African American: 86 mL/min (ref >=60)
Glucose, Bld: 95 mg/dL (ref 65–99)
Potassium: 4.2 mmol/L (ref 3.5–5.3)
Sodium: 143 mmol/L (ref 135–146)
Total Bilirubin: 0.8 mg/dL (ref 0.2–1.2)
Total Protein: 7.8 g/dL (ref 6.1–8.1)

## 2016-10-09 LAB — LIPID PANEL W/REFLEX DIRECT LDL
Cholesterol: 201 mg/dL — ABNORMAL HIGH (ref <200)
HDL: 93 mg/dL (ref >50)
LDL-Cholesterol: 91 mg/dL
Non-HDL Cholesterol (Calc): 108 mg/dL (ref <130)
Total CHOL/HDL Ratio: 2.2 Ratio (ref <5.0)
Triglycerides: 80 mg/dL (ref <150)

## 2016-10-09 MED ORDER — AMLODIPINE BESYLATE 10 MG PO TABS: 10.0000 mg | ORAL_TABLET | Freq: Every day | ORAL | 1 refills | Status: DC

## 2016-10-09 NOTE — Progress Notes (Signed)
Subjective:    CC: HTN  HPI: Hypertension- Pt denies chest pain, SOB, dizziness, or heart palpitations.  Taking meds as directed w/o problems.  Denies medication side effects.    Right shoulder x 3 weeks. Patient complains of pain on the outer shoulder for about 3 weeks. No known injury but she says sometimes her dogs will jerk on the lesion and yanked her arm. She's not really been taking any medication for it. It has just felt sore with certain activities. No prior history of injury.  BMI 27-she says she is the heaviest she's weighed since she had twins. She is currently very satisfied with her weight.  Hyperlipidemia-currently on simvastatin and red yeast rice extract. Tolerating well without any side effects or problems.  Past medical history, Surgical history, Family history not pertinant except as noted below, Social history, Allergies, and medications have been entered into the medical record, reviewed, and corrections made.   Review of Systems: No fevers, chills, night sweats, weight loss, chest pain, or shortness of breath.   Objective:    General: Well Developed, well nourished, and in no acute distress.  Neuro: Alert and oriented x3, extra-ocular muscles intact, sensation grossly intact.  HEENT: Normocephalic, atraumatic  Skin: Warm and dry, no rashes. Cardiac: Regular rate and rhythm, no murmurs rubs or gallops, no lower extremity edema.  Respiratory: Clear to auscultation bilaterally. Not using accessory muscles, speaking in full sentences. MSK: R shoulder with normal range of motion. Able to extend fully but she does have discomfort with full extension. She is able to reach over and touch opposite shoulder that she does have discomfort with this. Some pain with flexion upward in this position. Some slight weakness with empty can test on the right. Normal external rotation and good liftoff strength.   Impression and Recommendations:    HTN - Well controlled. Continue  current regimen. Follow up in  6 months.    Right shoulder pain - Likely rotator cuff strain. I do not think she has a significant tear or dysfunction. Recommend stretches. Handout provided. Recommend a trial of an anti-inflammatory such as Aleve or ibuprofen for the next 5-6 days. If not improving them please let us know.  Patient reports that she has had a colonoscopy in less than the last 10 years. She typically goes to [facility]. We'll need to call to get records.  She also had a question about possibly taking Contrave for weight loss.. I encouraged her to schedule a future appointment so that we discussed that in detail. She is concerned because she has gained a fair amount weight in the last couple of years.  Hyperlipidemia-due to recheck lipid panel. Continue current regimen.

## 2016-11-02 ENCOUNTER — Encounter: Payer: Self-pay | Admitting: Family Medicine

## 2016-11-03 ENCOUNTER — Other Ambulatory Visit: Payer: Self-pay | Admitting: Family Medicine

## 2017-04-29 ENCOUNTER — Other Ambulatory Visit: Payer: Self-pay | Admitting: Family Medicine

## 2017-04-29 DIAGNOSIS — Z1231 Encounter for screening mammogram for malignant neoplasm of breast: Secondary | ICD-10-CM

## 2017-05-07 ENCOUNTER — Ambulatory Visit (INDEPENDENT_AMBULATORY_CARE_PROVIDER_SITE_OTHER): Payer: Medicare Other | Admitting: Family Medicine

## 2017-05-07 ENCOUNTER — Encounter: Payer: Self-pay | Admitting: Family Medicine

## 2017-05-07 VITALS — BP 128/71 | HR 70 | Ht 63.0 in | Wt 153.0 lb

## 2017-05-07 DIAGNOSIS — M7062 Trochanteric bursitis, left hip: Secondary | ICD-10-CM

## 2017-05-07 DIAGNOSIS — R21 Rash and other nonspecific skin eruption: Secondary | ICD-10-CM

## 2017-05-07 DIAGNOSIS — L738 Other specified follicular disorders: Secondary | ICD-10-CM | POA: Diagnosis not present

## 2017-05-07 DIAGNOSIS — I10 Essential (primary) hypertension: Secondary | ICD-10-CM

## 2017-05-07 MED ORDER — SIMVASTATIN 20 MG PO TABS: ORAL_TABLET | ORAL | 3 refills | Status: DC

## 2017-05-07 MED ORDER — AMLODIPINE BESYLATE 10 MG PO TABS: 10.0000 mg | ORAL_TABLET | Freq: Every day | ORAL | 1 refills | Status: DC

## 2017-05-07 MED ORDER — CLOBETASOL PROP EMOLLIENT BASE 0.05 % EX CREA: 1.0000 "application " | TOPICAL_CREAM | Freq: Two times a day (BID) | CUTANEOUS | 2 refills | Status: DC

## 2017-05-07 NOTE — Progress Notes (Signed)
Subjective:    CC: HTN  HPI:  Hypertension- Pt denies chest pain, SOB, dizziness, or heart palpitations.  Taking meds as directed w/o problems.  Denies medication side effects.    She is also been getting a rash after she gets out of her hot tub.  She thinks she may be putting too many chemicals in it.  She would like a refill on the clobetasol cream.  She says is 1 of the few things that actually really helps it because it gets very itchy.  She also has left hip pain.  It has been persistent for a couple of years at this point.  She says it is painful to turn over and sleep on that hip.  She says at night sometimes the pain will radiate down the outside of her leg.  She also has persistent left outer ankle pain though it is constant.  She sometimes will use ibuprofen and that helps.  She is able to walk without significant difficulties but some days it is more painful than others.  He says her left hip is not bothering her as much today.  She denies any low back pain.  He also continues to have intermittent right shoulder pain.  She has had problems with this on and off for the last couple of years as well.  She is previously seen Dr. Dianah Field and had x-rays.  Is really bothersome if she just if she is reaching out for something.  Then she will get a shooting pain from just above her elbow up into her shoulder.  Past medical history, Surgical history, Family history not pertinant except as noted below, Social history, Allergies, and medications have been entered into the medical record, reviewed, and corrections made.   Review of Systems: No fevers, chills, night sweats, weight loss, chest pain, or shortness of breath.   Objective:    General: Well Developed, well nourished, and in no acute distress.  Neuro: Alert and oriented x3, extra-ocular muscles intact, sensation grossly intact.  HEENT: Normocephalic, atraumatic  Skin: Warm and dry. She has fine erythematous 21mm inflammation fo the  follicles on her low back.   Cardiac: Regular rate and rhythm, no murmurs rubs or gallops, no lower extremity edema.  Respiratory: Clear to auscultation bilaterally. Not using accessory muscles, speaking in full sentences. MSK: Left hip knee and ankle with normal range of motion.  Mildly tender over the trochanteric bursa.  Some slight weakness with abduction against resistance.   Impression and Recommendations:    HTN - Well controlled. Continue current regimen. Follow up in  6 months.  Labs are up to date.   Hot tub folliculitis-recommending avoiding contact irritants.  Make sure moisturizing the skin well.  And will refill her clobetasol cream since it does seem to help.  Left trochanteric bursitis-we will give her a handout with stretches and exercise to do on her own.  Discussed that we could always consider a steroid injection if not improving.  Right shoulder pain - recommend she follow back up with Dr. Darene Lamer for further tx and evaluation.

## 2017-05-09 ENCOUNTER — Other Ambulatory Visit: Payer: Self-pay

## 2017-05-15 ENCOUNTER — Ambulatory Visit: Payer: Medicare Other

## 2017-05-22 ENCOUNTER — Ambulatory Visit (INDEPENDENT_AMBULATORY_CARE_PROVIDER_SITE_OTHER): Payer: Medicare Other

## 2017-05-22 DIAGNOSIS — Z1231 Encounter for screening mammogram for malignant neoplasm of breast: Secondary | ICD-10-CM

## 2017-07-04 ENCOUNTER — Ambulatory Visit (INDEPENDENT_AMBULATORY_CARE_PROVIDER_SITE_OTHER): Payer: Medicare Other | Admitting: Family Medicine

## 2017-07-04 ENCOUNTER — Encounter: Payer: Self-pay | Admitting: Family Medicine

## 2017-07-04 VITALS — BP 138/62 | HR 70 | Ht 63.0 in | Wt 154.0 lb

## 2017-07-04 DIAGNOSIS — L821 Other seborrheic keratosis: Secondary | ICD-10-CM

## 2017-07-04 DIAGNOSIS — D229 Melanocytic nevi, unspecified: Secondary | ICD-10-CM

## 2017-07-04 DIAGNOSIS — R238 Other skin changes: Secondary | ICD-10-CM

## 2017-07-04 DIAGNOSIS — L570 Actinic keratosis: Secondary | ICD-10-CM | POA: Diagnosis not present

## 2017-07-04 NOTE — Addendum Note (Signed)
Addended by: Teddy Spike on: 07/04/2017 10:10 AM   Modules accepted: Orders

## 2017-07-04 NOTE — Progress Notes (Signed)
   Subjective:    Patient ID: Samantha Cross, female    DOB: 20-Nov-1945, 72 y.o.   MRN: [id number]  HPI 72 year old female comes in today with a couple of lesions that she would like me to look at.  She has one at the corner of her right eye that she would like removed.  One that is a little bit larger and raised and gets irritated because it is right along the edge of her bra on her right breast.  And she has a purple discoloration on her upper right lip that she would like me to look at today.   Review of Systems     Objective:   Physical Exam  Constitutional: She is oriented to person, place, and time. She appears well-developed and well-nourished.  HENT:  Head: Normocephalic and atraumatic.  Eyes: Conjunctivae and EOM are normal.  Cardiovascular: Normal rate.  Pulmonary/Chest: Effort normal.  Musculoskeletal:  Lesion on the face just lateral to the right eye was approximately 0.7 mm round hyperpigmented lesion with a slightly scaly surface.  Lesion on the right upper breast area it was a little bit larger at 1.1 mm with a little bit more irregular but yet smooth surface and was also darkly pigmented.  Neurological: She is alert and oriented to person, place, and time.  Skin: Skin is dry. No pallor.  Psychiatric: She has a normal mood and affect. Her behavior is normal.  Vitals reviewed.         Assessment & Plan:  Seborrheic keratosis-the lesion just lateral to the right eyes most consistent with seborrheic keratosis.  Shave biopsy performed just for confirmation and for for definitive treatment of the lesion.  Atypical nevus of the right breast-most consistent with atypical nevus versus very thickened and large seborrheic keratosis that has more smooth appearance rather than a dry scaly appearance.  Sent for pathology.  Will call with results once available.  Venous lake of the upper lip-discussed the benign nature of this lesion I do not recommend removal or excision but  if at some point she would like to consider this will refer to dermatology for further treatment.  Shave Biopsy Procedure Note  Pre-operative Diagnosis: Seborrheic keratosis possibly right lateral eye and atypical nevus on the right breast.   Post-operative Diagnosis: same  Locations:lateral to right eye and on upper right breast.    Indications: irritation   Anesthesia: Lidocaine 1% with epinephrine without added sodium bicarbonate  Procedure Details  Patient informed of the risks (including bleeding and infection) and benefits of the  procedure and Verbal informed consent obtained.  The lesion and surrounding area were given a sterile prep using chlorhexidine and draped in the usual sterile fashion. A scalpel was used to shave an area of skin approximately 0.70mm round lesion on right upper face just lateral to the eye.  2nd lesion is approx 1.1 cm on the right breast. .  Hemostasis achieved with alumuninum chloride. Antibiotic ointment and a sterile dressing applied.  The specimen was sent for pathologic examination. The patient tolerated the procedure well.  EBL: trace  Findings: Await pathology   Condition: Stable  Complications: none.  Plan: 1. Instructed to keep the wound dry and covered for 24-48h and clean thereafter. 2. Warning signs of infection were reviewed.   3. Recommended that the patient use OTC acetaminophen as needed for pain.  4. Return PRN.

## 2017-07-04 NOTE — Patient Instructions (Signed)
Keep wound covered until tomorrow morning if possible.  After that okay to remove the bandage and take a shower.  Do not scrub at the area.  Do not apply any alcohol or peroxide.  Just pat dry and then apply a small dab of Vaseline.  Apply Vaseline twice a day for 2 weeks.  You can keep covered for the first day or 2 if you would like with a loose Band-Aid.  Please call if there is any concerns or signs of infection of the wound.

## 2018-01-28 ENCOUNTER — Other Ambulatory Visit: Payer: Self-pay | Admitting: Family Medicine

## 2018-03-26 NOTE — Progress Notes (Deleted)
Subjective:   JACOLYN JOAQUIN is a 72 y.o. female who presents for an Initial Medicare Annual Wellness Visit.  Review of Systems    No ROS.  Medicare Wellness Visit. Additional risk factors are reflected in the social history.  Sleep patterns:    Home Safety/Smoke Alarms: Feels safe in home. Smoke alarms in place.  Living environment; .    Female:   Pap-  Aged out     Mammo- utd      Dexa scan-        CCS- utd        Objective:    There were no vitals filed for this visit. There is no height or weight on file to calculate BMI.  No flowsheet data found.  Current Medications (verified) Outpatient Encounter Medications as of 04/09/2018  Medication Sig  . amLODipine (NORVASC) 10 MG tablet TAKE 1 TABLET(10 MG) BY MOUTH DAILY  . Clobetasol Prop Emollient Base (CLOBETASOL PROPIONATE E) 0.05 % emollient cream Apply 1 application topically 2 (two) times daily.  . RED YEAST RICE EXTRACT PO Take 1 tablet by mouth daily.  . simvastatin (ZOCOR) 20 MG tablet TAKE 1 TABLET BY MOUTH DAILY QHS   No facility-administered encounter medications on file as of 04/09/2018.     Allergies (verified) Lisinopril; Alendronate [alendronate]; and Hydroxyzine   History: Past Medical History:  Diagnosis Date  . Diverticulosis   . Hemorrhoids   . Hyperlipidemia   . Hypertension   . Postmenopausal    Past Surgical History:  Procedure Laterality Date  . ABDOMINAL HYSTERECTOMY     without BSO/heavy periods  . CLEFT PALATE REPAIR    . MIDDLE EAR SURGERY     left   Family History  Problem Relation Age of Onset  . Cancer Mother 16       brain tumor  . Cancer Brother        brain tumor/gliobastoma, multiforme  . Heart attack Brother 95  . Cancer Brother        renal cell carcinoma, colon ca  . Heart attack Father 20       died/CVA  . Hyperlipidemia Unknown   . Ovarian cancer Mother    Social History   Socioeconomic History  . Marital status: Married    Spouse name: Not on  file  . Number of children: Not on file  . Years of education: Not on file  . Highest education level: Not on file  Occupational History  . Occupation: has a farm.   Social Needs  . Financial resource strain: Not on file  . Food insecurity:    Worry: Not on file    Inability: Not on file  . Transportation needs:    Medical: Not on file    Non-medical: Not on file  Tobacco Use  . Smoking status: Never Smoker  . Smokeless tobacco: Never Used  Substance and Sexual Activity  . Alcohol use: Yes    Alcohol/week: 3.0 standard drinks    Types: 3 Glasses of wine per week    Comment: per week  . Drug use: No  . Sexual activity: Not on file    Comment: married, 2 grown children, 2 stepkids, no regular exercise, 3 beagles, working PT at daycare.  Lifestyle  . Physical activity:    Days per week: Not on file    Minutes per session: Not on file  . Stress: Not on file  Relationships  . Social connections:    Talks on  phone: Not on file    Gets together: Not on file    Attends religious service: Not on file    Active member of club or organization: Not on file    Attends meetings of clubs or organizations: Not on file    Relationship status: Not on file  Other Topics Concern  . Not on file  Social History Narrative  . Not on file    Tobacco Counseling Counseling given: Not Answered   Clinical Intake:                        Activities of Daily Living No flowsheet data found.   Immunizations and Health Maintenance Immunization History  Administered Date(s) Administered  . Tdap 02/29/2012  . Zoster 03/05/2012   There are no preventive care reminders to display for this patient.  Patient Care Team: [name], MD as PCP - General (Family Medicine)  Indicate any recent Medical Services you may have received from other than [facility] providers in the past year (date may be approximate).     Assessment:   This is a routine wellness examination for  Jissell.Physical assessment deferred to PCP.   Hearing/Vision screen No exam data present  Dietary issues and exercise activities discussed:   Diet Breakfast: Lunch:  Dinner:       Goals   None    Depression Screen PHQ 2/9 Scores 10/09/2016 11/08/2015  PHQ - 2 Score 0 0    Fall Risk Fall Risk  05/09/2017 05/07/2017 11/08/2015  Falls in the past year? No No No  Comment Emmi Telephone Survey: data to providers prior to load - -    Is the patient's home free of loose throw rugs in walkways, pet beds, electrical cords, etc?   {Blank single:19197::"yes","no"}      Grab bars in the bathroom? {Blank single:19197::"yes","no"}      Handrails on the stairs?   {Blank single:19197::"yes","no"}      Adequate lighting?   {Blank single:19197::"yes","no"}   Cognitive Function:        Screening Tests Health Maintenance  Topic Date Due  . INFLUENZA VACCINE  05/04/2020 (Originally 01/02/2018)  . PNA vac Low Risk Adult (1 of 2 - PCV13) 05/09/2021 (Originally 04/24/2013)  . MAMMOGRAM  05/23/2019  . COLONOSCOPY  06/28/2020  . TETANUS/TDAP  02/28/2022  . DEXA SCAN  Completed  . Hepatitis C Screening  Completed     Plan:   ***  I have personally reviewed and noted the following in the patient's chart:   . Medical and social history . Use of alcohol, tobacco or illicit drugs  . Current medications and supplements . Functional ability and status . Nutritional status . Physical activity . Advanced directives . List of other physicians . Hospitalizations, surgeries, and ER visits in previous 12 months . Vitals . Screenings to include cognitive, depression, and falls . Referrals and appointments  In addition, I have reviewed and discussed with patient certain preventive protocols, quality metrics, and best practice recommendations. A written personalized care plan for preventive services as well as general preventive health recommendations were provided to patient.     Joanne Chars, LPN   [id number]

## 2018-04-09 ENCOUNTER — Encounter: Payer: Self-pay | Admitting: Family Medicine

## 2018-04-09 ENCOUNTER — Ambulatory Visit (INDEPENDENT_AMBULATORY_CARE_PROVIDER_SITE_OTHER): Payer: Medicare Other | Admitting: Family Medicine

## 2018-04-09 ENCOUNTER — Other Ambulatory Visit: Payer: Self-pay | Admitting: Family Medicine

## 2018-04-09 ENCOUNTER — Ambulatory Visit: Payer: Medicare Other

## 2018-04-09 VITALS — BP 128/68 | HR 65 | Ht 64.57 in | Wt 152.0 lb

## 2018-04-09 DIAGNOSIS — M25552 Pain in left hip: Secondary | ICD-10-CM

## 2018-04-09 DIAGNOSIS — I10 Essential (primary) hypertension: Secondary | ICD-10-CM | POA: Diagnosis not present

## 2018-04-09 DIAGNOSIS — N63 Unspecified lump in unspecified breast: Secondary | ICD-10-CM | POA: Diagnosis not present

## 2018-04-09 DIAGNOSIS — E7800 Pure hypercholesterolemia, unspecified: Secondary | ICD-10-CM

## 2018-04-09 MED ORDER — AMLODIPINE BESYLATE 10 MG PO TABS: ORAL_TABLET | ORAL | 1 refills | Status: DC

## 2018-04-09 NOTE — Progress Notes (Addendum)
Subjective:    CC: breast issue  HPI:  Hypertension- Pt denies chest pain, SOB, dizziness, or heart palpitations.  Taking meds as directed w/o problems.  Denies medication side effects.    Hyperlipidemia-tolerating statin well without any side effects or myalgias. On simvastatin.   She is also concerned because she is noticed an indention in her right breast when she lifts her arm.  She is been told that she is had some fibrosis in that breast before.  She is not having any pain or discomfort.  She is actually due for her repeat screening mammogram next month.  He had a normal mammogram May 22, 2017.  Past medical history, Surgical history, Family history not pertinant except as noted below, Social history, Allergies, and medications have been entered into the medical record, reviewed, and corrections made.   Review of Systems: No fevers, chills, night sweats, weight loss, chest pain, or shortness of breath.   Objective:    General: Well Developed, well nourished, and in no acute distress.  Neuro: Alert and oriented x3, extra-ocular muscles intact, sensation grossly intact.  HEENT: Normocephalic, atraumatic  Skin: Warm and dry, no rashes. Cardiac: Regular rate and rhythm, no murmurs rubs or gallops, no lower extremity edema.  Respiratory: Clear to auscultation bilaterally. Not using accessory muscles, speaking in full sentences. Breast: Left breast mass approximately 5 x 5-1/2 cm just above the nipple edge at 12 oclock.  With retraction of the nipple. MSK: Left hip is tender over the trochanteric bursa. Left hip with NROM. Strength is normal. Some pain with hip flexion against resistance.     Impression and Recommendations:    HTN - Well controlled. Continue current regimen. Follow up in  33months.    Hyperlipidemia-to recheck lipids and liver enzymes.  Breast mass, right -we will send for diagnostic mammogram and ultrasound for further work-up.  Left Hip pain-consistent with  trochanteric bursitis.  Handout given for stretches to do on her own at home.  If not improving then encouraged to follow-up with Dr. Dianah Field who had seen her for similar issues about 4 years ago.  -

## 2018-04-10 LAB — COMPLETE METABOLIC PANEL WITH GFR
AG Ratio: 1.6 (calc) (ref 1.0–2.5)
ALT: 15 U/L (ref 6–29)
AST: 14 U/L (ref 10–35)
Albumin: 4.5 g/dL (ref 3.6–5.1)
Alkaline phosphatase (APISO): 80 U/L (ref 33–130)
BUN: 13 mg/dL (ref 7–25)
CO2: 28 mmol/L (ref 20–32)
Calcium: 9.8 mg/dL (ref 8.6–10.4)
Chloride: 107 mmol/L (ref 98–110)
Creat: 0.78 mg/dL (ref 0.50–0.99)
GFR, Est African American: 90 mL/min/{1.73_m2} (ref >=60)
GFR, Est Non African American: 78 mL/min/{1.73_m2} (ref >=60)
Globulin: 2.8 g/dL (calc) (ref 1.9–3.7)
Glucose, Bld: 103 mg/dL — ABNORMAL HIGH (ref 65–99)
Potassium: 4.1 mmol/L (ref 3.5–5.3)
Sodium: 144 mmol/L (ref 135–146)
Total Bilirubin: 0.9 mg/dL (ref 0.2–1.2)
Total Protein: 7.3 g/dL (ref 6.1–8.1)

## 2018-04-10 LAB — LIPID PANEL
Cholesterol: 202 mg/dL — ABNORMAL HIGH (ref <200)
HDL: 74 mg/dL (ref >50)
LDL Cholesterol (Calc): 111 mg/dL (calc) — ABNORMAL HIGH
Non-HDL Cholesterol (Calc): 128 mg/dL (calc) (ref <130)
Total CHOL/HDL Ratio: 2.7 (calc) (ref <5.0)
Triglycerides: 82 mg/dL (ref <150)

## 2018-04-21 ENCOUNTER — Ambulatory Visit
Admission: RE | Admit: 2018-04-21 | Discharge: 2018-04-21 | Disposition: A | Discharge status: Home / Self Care | Payer: Medicare Other | Source: Ambulatory Visit | Attending: Family Medicine | Admitting: Family Medicine

## 2018-04-21 ENCOUNTER — Other Ambulatory Visit: Payer: Self-pay | Admitting: Family Medicine

## 2018-04-21 DIAGNOSIS — N63 Unspecified lump in unspecified breast: Secondary | ICD-10-CM

## 2018-04-21 DIAGNOSIS — N6312 Unspecified lump in the right breast, upper inner quadrant: Secondary | ICD-10-CM | POA: Diagnosis not present

## 2018-04-21 DIAGNOSIS — N6310 Unspecified lump in the right breast, unspecified quadrant: Secondary | ICD-10-CM

## 2018-04-21 DIAGNOSIS — N6311 Unspecified lump in the right breast, upper outer quadrant: Secondary | ICD-10-CM | POA: Diagnosis not present

## 2018-04-21 DIAGNOSIS — R928 Other abnormal and inconclusive findings on diagnostic imaging of breast: Secondary | ICD-10-CM | POA: Diagnosis not present

## 2018-04-23 ENCOUNTER — Ambulatory Visit
Admission: RE | Admit: 2018-04-23 | Discharge: 2018-04-23 | Disposition: A | Discharge status: Home / Self Care | Payer: Medicare Other | Source: Ambulatory Visit | Attending: Family Medicine | Admitting: Family Medicine

## 2018-04-23 DIAGNOSIS — N6310 Unspecified lump in the right breast, unspecified quadrant: Secondary | ICD-10-CM

## 2018-04-23 DIAGNOSIS — N6315 Unspecified lump in the right breast, overlapping quadrants: Secondary | ICD-10-CM | POA: Diagnosis not present

## 2018-04-23 DIAGNOSIS — C50411 Malignant neoplasm of upper-outer quadrant of right female breast: Secondary | ICD-10-CM | POA: Diagnosis not present

## 2018-04-23 DIAGNOSIS — Z170 Estrogen receptor positive status [ER+]: Secondary | ICD-10-CM | POA: Diagnosis not present

## 2018-04-23 DIAGNOSIS — N6311 Unspecified lump in the right breast, upper outer quadrant: Secondary | ICD-10-CM | POA: Diagnosis not present

## 2018-04-29 ENCOUNTER — Encounter: Payer: Self-pay | Admitting: Genetics

## 2018-05-06 ENCOUNTER — Other Ambulatory Visit: Payer: Self-pay | Admitting: General Surgery

## 2018-05-06 ENCOUNTER — Other Ambulatory Visit: Payer: Self-pay | Admitting: Family Medicine

## 2018-05-06 DIAGNOSIS — C50411 Malignant neoplasm of upper-outer quadrant of right female breast: Secondary | ICD-10-CM

## 2018-05-06 DIAGNOSIS — Z170 Estrogen receptor positive status [ER+]: Secondary | ICD-10-CM | POA: Diagnosis not present

## 2018-05-06 DIAGNOSIS — Z853 Personal history of malignant neoplasm of breast: Secondary | ICD-10-CM

## 2018-05-07 ENCOUNTER — Telehealth: Payer: Self-pay | Admitting: Hematology and Oncology

## 2018-05-07 NOTE — Telephone Encounter (Signed)
Received an urgent referral from Dr. [name] at [place] for [facility] and genetics. Pt has been cld and scheduled for genetic counseling on 12/9 at 8am and to see Dr. Lindi Adie at [place] and her daughter agreed to the appts.

## 2018-05-12 ENCOUNTER — Encounter: Payer: Self-pay | Admitting: *Deleted

## 2018-05-12 ENCOUNTER — Inpatient Hospital Stay: Payer: Medicare Other | Attending: Hematology and Oncology | Admitting: Hematology and Oncology

## 2018-05-12 ENCOUNTER — Inpatient Hospital Stay (HOSPITAL_BASED_OUTPATIENT_CLINIC_OR_DEPARTMENT_OTHER): Payer: Medicare Other | Admitting: Genetic Counselor

## 2018-05-12 ENCOUNTER — Telehealth: Payer: Self-pay | Admitting: Hematology and Oncology

## 2018-05-12 ENCOUNTER — Telehealth: Payer: Self-pay | Admitting: *Deleted

## 2018-05-12 ENCOUNTER — Inpatient Hospital Stay: Payer: Medicare Other

## 2018-05-12 ENCOUNTER — Encounter: Payer: Self-pay | Admitting: Radiation Oncology

## 2018-05-12 ENCOUNTER — Encounter: Payer: Self-pay | Admitting: Genetic Counselor

## 2018-05-12 DIAGNOSIS — Z79811 Long term (current) use of aromatase inhibitors: Secondary | ICD-10-CM | POA: Diagnosis not present

## 2018-05-12 DIAGNOSIS — I10 Essential (primary) hypertension: Secondary | ICD-10-CM | POA: Insufficient documentation

## 2018-05-12 DIAGNOSIS — Z90710 Acquired absence of both cervix and uterus: Secondary | ICD-10-CM | POA: Insufficient documentation

## 2018-05-12 DIAGNOSIS — Z170 Estrogen receptor positive status [ER+]: Secondary | ICD-10-CM

## 2018-05-12 DIAGNOSIS — Z803 Family history of malignant neoplasm of breast: Secondary | ICD-10-CM | POA: Diagnosis not present

## 2018-05-12 DIAGNOSIS — Z8051 Family history of malignant neoplasm of kidney: Secondary | ICD-10-CM | POA: Diagnosis not present

## 2018-05-12 DIAGNOSIS — Z1379 Encounter for other screening for genetic and chromosomal anomalies: Secondary | ICD-10-CM

## 2018-05-12 DIAGNOSIS — C50411 Malignant neoplasm of upper-outer quadrant of right female breast: Secondary | ICD-10-CM | POA: Insufficient documentation

## 2018-05-12 DIAGNOSIS — Z800 Family history of malignant neoplasm of digestive organs: Secondary | ICD-10-CM | POA: Insufficient documentation

## 2018-05-12 DIAGNOSIS — Z808 Family history of malignant neoplasm of other organs or systems: Secondary | ICD-10-CM

## 2018-05-12 DIAGNOSIS — Z8041 Family history of malignant neoplasm of ovary: Secondary | ICD-10-CM | POA: Insufficient documentation

## 2018-05-12 DIAGNOSIS — Z79899 Other long term (current) drug therapy: Secondary | ICD-10-CM | POA: Diagnosis not present

## 2018-05-12 MED ORDER — LETROZOLE 2.5 MG PO TABS: 2.5000 mg | ORAL_TABLET | Freq: Every day | ORAL | 3 refills | Status: DC

## 2018-05-12 NOTE — Progress Notes (Signed)
REFERRING PROVIDER: Jovita Kussmaul, MD [place],  [id number]  PRIMARY PROVIDER:  [name], MD  PRIMARY REASON FOR VISIT:  1. Malignant neoplasm of upper-outer quadrant of right breast in female, estrogen receptor positive ([place])   2. Family history of breast cancer   3. Family history of ovarian cancer   4. Family history of brain cancer   5. Family history of melanoma   6. Family history of colon cancer      HISTORY OF PRESENT ILLNESS:   Samantha Cross, a 71 y.o. female, was seen for a [facility] cancer genetics consultation at the request of Dr. [name] due to a personal and family history of cancer.  Samantha Cross presents to clinic today to discuss the possibility of a hereditary predisposition to cancer, genetic testing, and to further clarify her future cancer risks, as well as potential cancer risks for family members.   In November 2019, at the age of 20, Samantha Cross was diagnosed with Invasive lobular carcinoma of the right breast. She is in the midst of appointments to learn how this will be treated, but she understands that she will be on anti-estrogen.  Samantha Cross reports that she is not aware of anyone in the family having genetic testing, but that her sister was told that she 'must' have the gene and is being followed as if she is positive.    CANCER HISTORY:    Malignant neoplasm of upper-outer quadrant of right breast in female, estrogen receptor positive ([place])   04/23/2018 Initial Diagnosis    Palpable lump in right breast UOQ, mammogram revealed 2 separate masses.  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, ER 95%, PR 0%, Ki-67 10%, HER-2 negative IHC 1+ T3 N0 stage 3A    05/12/2018 Cancer Staging    Staging form: Breast, AJCC 8th Edition - Clinical: Stage IIIA (cT3(m), cN0, cM0, G3, ER+, PR-, HER2-) - Signed by  [name], MD on 05/12/2018      HORMONAL RISK FACTORS:  Menarche was at age 33.  First live birth at age 63.  OCP use for approximately 1 years.  Ovaries intact: yes.  Hysterectomy: yes.  Menopausal status: postmenopausal.  HRT use: 0 years. Colonoscopy: yes; Between 5-10 polyps. Mammogram within the last year: yes. Number of breast biopsies: 1. Up to date with pelvic exams:  no. Any excessive radiation exposure in the past:  no  Past Medical History:  Diagnosis Date  . Diverticulosis   . Family history of brain cancer   . Family history of breast cancer   . Family history of colon cancer   . Family history of melanoma   . Family history of ovarian cancer   . Hemorrhoids   . Hyperlipidemia   . Hypertension   . Postmenopausal     Past Surgical History:  Procedure Laterality Date  . ABDOMINAL HYSTERECTOMY     without BSO/heavy periods  . CLEFT PALATE REPAIR    . MIDDLE EAR SURGERY     left    Social History   Socioeconomic History  . Marital status: Married    Spouse name: Not on file  . Number of children: Not on file  . Years of education: Not on file  . Highest education level: Not on file  Occupational History  . Occupation: has a farm.   Social Needs  .  Financial resource strain: Not on file  . Food insecurity:    Worry: Not on file    Inability: Not on file  . Transportation needs:    Medical: Not on file    Non-medical: Not on file  Tobacco Use  . Smoking status: Never Smoker  . Smokeless tobacco: Never Used  Substance and Sexual Activity  . Alcohol use: Yes    Alcohol/week: 3.0 standard drinks    Types: 3 Glasses of wine per week    Comment: per week  . Drug use: No  . Sexual activity: Not on file    Comment: married, 2 grown children, 2 stepkids, no regular exercise, 3 beagles, working PT at daycare.  Lifestyle  . Physical activity:    Days per week: Not on file    Minutes per session: Not on file  . Stress: Not on file   Relationships  . Social connections:    Talks on phone: Not on file    Gets together: Not on file    Attends religious service: Not on file    Active member of club or organization: Not on file    Attends meetings of clubs or organizations: Not on file    Relationship status: Not on file  Other Topics Concern  . Not on file  Social History Narrative  . Not on file     FAMILY HISTORY:  We obtained a detailed, 4-generation family history.  Significant diagnoses are listed below: Family History  Problem Relation Age of Onset  . Ovarian cancer Mother        dx under 30; d. 34  . Colon cancer Mother 41  . Cancer Brother        brain tumor/gliobastoma, multiforme  . Melanoma Brother   . Kidney cancer Brother 108  . Cancer Brother        renal cell carcinoma, colon ca  . Heart attack Father 41       died/CVA  . Heart attack Brother 29  . Hyperlipidemia Unknown   . Breast cancer Sister 32  . Cancer Maternal Aunt        NOS  . Colon cancer Maternal Uncle        dx in his 41s  . Colon cancer Maternal Grandmother        dx over 54    The patient has a son and daughter who are cancer free.  She has one sister and three brothers.  Her sister was diagnosed with breast cancer at age 54.  One brother was diagnosed with colon cancer in his 82's, and another brother was diagnosed with melanoma, kidney and brain cancer and died at 12.  Both parents are deceased.  The patient's father died at 21 from heart disease. He had 12 siblings who are all cancer free.  The paternal grandparents are deceased from non cancer related issues.  The patient's mother was diagnosed with colon and ovarian cancer and died at 34.  She had a brother and sister.  Her brother died of colon cancer and his sister died of a cancer NOS.  The maternal grandparents are both deceased.  The grandmother had colon cancer in her 74's-60's.  Samantha Cross is unaware of previous family history of genetic testing for  hereditary cancer risks. Patient's maternal ancestors are of European descent, and paternal ancestors are of Caucasian descent. There is reported Ashkenazi Jewish ancestry.  The maternal grandparents moved to [place] during [place] and became [employer]. There is  no known consanguinity.  GENETIC COUNSELING ASSESSMENT: Samantha Cross is a 72 y.o. female with a personal and family history of cancer which is somewhat suggestive of a hereditary syndrome, either HBOC, or more likely Lynch syndrome and predisposition to cancer. We, therefore, discussed and recommended the following at today's visit.   DISCUSSION: We discussed that about 5-10% of breast cancer is hereditary with most cases due to BRCA mutations.  Based on the family history of breast and ovarian cancer, and the Jewish ancestry, this is a plausible risk.  However, the breast cancer in the family is later in life, both individuals with breast cancer having it in their 4's.  We discussed that the family history of early onset colon cancer, along with ovarian and other cancers, is also indicative for Lynch syndrome.  We reviewed the characteristics, features and inheritance patterns of hereditary cancer syndromes. We also discussed genetic testing, including the appropriate family members to test, the process of testing, insurance coverage and turn-around-time for results. We discussed the implications of a negative, positive and/or variant of uncertain significant result. In order to get genetic test results in a timely manner so that Samantha Cross can use these genetic test results for surgical decisions, we recommended Samantha Cross pursue genetic testing for the 9-gene STAT panel. If this test is negative, we then recommend Samantha Cross pursue reflex genetic testing to the Multi-cancer gene panel. The Multi-Gene Panel offered by [facility] includes sequencing and/or deletion duplication testing of the following 84 genes: AIP, ALK, APC, ATM, AXIN2,BAP1,   BARD1, BLM, BMPR1A, BRCA1, BRCA2, BRIP1, CASR, CDC73, CDH1, CDK4, CDKN1B, CDKN1C, CDKN2A (p14ARF), CDKN2A (p16INK4a), CEBPA, CHEK2, CTNNA1, DICER1, DIS3L2, EGFR (c.2369C>T, p.Thr790Met variant only), EPCAM (Deletion/duplication testing only), FH, FLCN, GATA2, GPC3, GREM1 (Promoter region deletion/duplication testing only), HOXB13 (c.251G>A, p.Gly84Glu), HRAS, KIT, MAX, MEN1, MET, MITF (c.952G>A, p.Glu318Lys variant only), MLH1, MSH2, MSH3, MSH6, MUTYH, NBN, NF1, NF2, NTHL1, PALB2, PDGFRA, PHOX2B, PMS2, POLD1, POLE, POT1, PRKAR1A, PTCH1, PTEN, RAD50, RAD51C, RAD51D, RB1, RECQL4, RET, RUNX1, SDHAF2, SDHA (sequence changes only), SDHB, SDHC, SDHD, SMAD4, SMARCA4, SMARCB1, SMARCE1, STK11, SUFU, TERC, TERT, TMEM127, TP53, TSC1, TSC2, VHL, WRN and WT1.    Based on Samantha Cross's personal and family history of cancer, she meets medical criteria for genetic testing. Despite that she meets criteria, she may still have an out of pocket cost. We discussed that if her out of pocket cost for testing is over $100, the laboratory will call and confirm whether she wants to proceed with testing.  If the out of pocket cost of testing is less than $100 she will be billed by the genetic testing laboratory.   PLAN: After considering the risks, benefits, and limitations, Samantha Cross  provided informed consent to pursue genetic testing and the blood sample was sent to [facility] for analysis of the STAT panel and Multi-cancer panel. Results should be available within approximately 2-3 weeks' time, at which point they will be disclosed by telephone to Samantha Cross, as will any additional recommendations warranted by these results. Samantha Cross will receive a summary of her genetic counseling visit and a copy of her results once available. This information will also be available in Epic. We encouraged Samantha Cross to remain in contact with cancer genetics annually so that we can continuously update the family history and  inform her of any changes in cancer genetics and testing that may be of benefit for her family. Samantha Cross questions were answered to her satisfaction today. Our contact information  was provided should additional questions or concerns arise.  Lastly, we encouraged Samantha Cross to remain in contact with cancer genetics annually so that we can continuously update the family history and inform her of any changes in cancer genetics and testing that may be of benefit for this family.   Ms.  Ulatowski questions were answered to her satisfaction today. Our contact information was provided should additional questions or concerns arise. Thank you for the referral and allowing [place] to share in the care of your patient.   Karen P. Florene Glen, [place], [facility] Certified Genetic Counselor [email or web address] phone: [phone]  The patient was seen for a total of 55 minutes in face-to-face genetic counseling.  This patient was discussed with Drs. Magrinat, Lindi Adie and/or Burr Medico who agrees with the above.    _______________________________________________________________________ For Office Staff:  Number of people involved in session: 2 Was an Intern/ student involved with case: no

## 2018-05-12 NOTE — Telephone Encounter (Signed)
Ordered oncotype on biopsy per Dr. Lindi Adie.  Faxed requisition to pathology and confirmed receipt.

## 2018-05-12 NOTE — Progress Notes (Signed)
Location of Breast Cancer: Right Breast  Histology per Pathology Report:  04/23/18 Diagnosis 1. Breast, right, needle core biopsy, UOQ; 12 o'clock 3cm from nipple - INVASIVE MAMMARY CARCINOMA, GRADE II-III. SEE NOTE. - MAMMARY CARCINOMA IN SITU, INTERMEDIATE NUCLEAR GRADE 2. Breast, right, needle core biopsy, 10 o'clock 6 cm from nipple - INVASIVE MAMMARY CARCINOMA, GRADE II-III. SEE NOTE. - MAMMARY CARCINOMA IN SITU, INTERMEDIATE NUCLEAR GRADE  Receptor Status: ER(95%), PR (NEG), Her2-neu (NEG), Ki-(10%)  Did patient present with symptoms or was this found on screening mammography?: She first felt the mass about a year ago in her right breast. It gradually increased in size and she presented to her physician.   Past/Anticipated interventions by surgeon, if any: 05/06/18 Dr. [name] He ordered a MRI and made referrals. She may possibly need a mastectomy per his notes.   Past/Anticipated interventions by medical oncology, if any: 05/12/18 Dr. Lindi Adie Recommendation: 1.  Mastectomy with sentinel lymph node biopsy 2.  Oncotype testing to determine if she would benefit from chemo 3.  Adjuvant radiation therapy 4.  Followed by adjuvant antiestrogen therapy  Return to clinic after surgery to discuss the final pathology report  Lymphedema issues, if any:  N/A  Pain issues, if any:  She denies.   SAFETY ISSUES:  Prior radiation? No  Pacemaker/ICD? No  Possible current pregnancy? No  Is the patient on methotrexate? No  Current Complaints / other details:   05/12/18 Genetic counseling 05/19/18 MRI Breast.   BP 133/80 (BP Location: Right Arm, Patient Position: Sitting)   Pulse 65   Temp 98.1 F (36.7 C) (Oral)   Resp 20   Ht '5\' 4"'  (1.626 m)   Wt 154 lb 9.6 oz (70.1 kg)   SpO2 99%   BMI 26.54 kg/m    Wt Readings from Last 3 Encounters:  05/16/18 154 lb 9.6 oz (70.1 kg)  05/12/18 155 lb 6.4 oz (70.5 kg)  04/09/18 152 lb (68.9 kg)   Burnham Trost, Stephani Police,  RN 05/12/2018,9:30 AM

## 2018-05-12 NOTE — Telephone Encounter (Signed)
Per 12/9 los.  [facility] will call patient regarding appts.  Patient did not stop by scheduling.

## 2018-05-12 NOTE — Assessment & Plan Note (Addendum)
04/23/2018:Palpable lump in right breast UOQ, mammogram revealed 2 separate masses.  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, ER 95%, PR 0%, Ki-67 10%, HER-2 negative IHC 1+ T3 N0 stage 3A  Pathology and radiology counseling: Discussed with the patient, the details of pathology including the type of breast cancer,the clinical staging, the significance of ER, PR and HER-2/neu receptors and the implications for treatment. After reviewing the pathology in detail, we proceeded to discuss the different treatment options between surgery, radiation, chemotherapy, antiestrogen therapies.  Recommendation: 1.  Oncotype testing to determine if she would benefit from chemo 2.  Neoadjuvant antiestrogen therapy versus neoadjuvant chemotherapy based upon Oncotype 3.  Followed by breast conserving surgery versus mastectomy depending on response 4.  Adjuvant radiation therapy 5.  Followed by adjuvant antiestrogen therapy  I started her on letrozole neo adjuvantly until we have the results of Oncotype testing.  Letrozole counseling:We discussed the risks and benefits of anti-estrogen therapy with aromatase inhibitors. These include but not limited to insomnia, hot flashes, mood changes, vaginal dryness, bone density loss, and weight gain. We strongly believe that the benefits far outweigh the risks. Patient understands these risks and consented to starting treatment.   We also plan to obtain CT chest abdomen pelvis and bone scan. Patient already has breast MRI scheduled. Return to clinic based upon scan and Oncotype test results.

## 2018-05-12 NOTE — Progress Notes (Signed)
[facility] CONSULT NOTE  Patient Care Team: [name], MD as PCP - General (Family Medicine)  CHIEF COMPLAINTS/PURPOSE OF CONSULTATION:  Newly diagnosed breast cancer  HISTORY OF PRESENTING ILLNESS:  Samantha Cross 72 y.o. female is here because of recent diagnosis of right breast cancer.  Patient felt the palpable lump in the right breast for the past 5 to 6 months.  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  She was seen by Dr. [name] who referred the patient was for discussion regarding treatment options.  She is accompanied today by her husband.  She complains of minimal discomfort in the breast post biopsy and has intermittent sharp pains in it.  I reviewed her records extensively and collaborated the history with the patient.  SUMMARY OF ONCOLOGIC HISTORY:   Malignant neoplasm of upper-outer quadrant of right breast in female, estrogen receptor positive ([place])   04/23/2018 Initial Diagnosis    Palpable lump in right breast UOQ, mammogram revealed 2 separate masses.  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 Diverticulosis   . Family history of brain cancer   . Family history of breast cancer   . Family history of colon cancer   . Family history of melanoma   . Family history of ovarian cancer   . Hemorrhoids   . Hyperlipidemia   . Hypertension   . Postmenopausal     SURGICAL HISTORY: Past  Surgical History:  Procedure Laterality Date  . ABDOMINAL HYSTERECTOMY     without BSO/heavy periods  . CLEFT PALATE REPAIR    . MIDDLE EAR SURGERY     left    SOCIAL HISTORY: Social History   Socioeconomic History  . Marital status: Married    Spouse name: Not on file  . Number of children: Not on file  . Years of education: Not on file  . Highest education level: Not on file  Occupational History  . Occupation: has a farm.   Social Needs  . Financial resource strain: Not on file  . Food insecurity:    Worry: Not on file    Inability: Not on file  . Transportation needs:    Medical: Not on file    Non-medical: Not on file  Tobacco Use  . Smoking status: Never Smoker  . Smokeless tobacco: Never Used  Substance and Sexual Activity  . Alcohol use: Yes    Alcohol/week: 3.0 standard drinks    Types: 3 Glasses of wine per week    Comment: per week  . Drug use: No  . Sexual activity: Not on file    Comment: married, 2 grown children, 2 stepkids, no regular exercise, 3 beagles, working PT at daycare.  Lifestyle  . Physical activity:    Days per week: Not on file    Minutes per session: Not on file  . Stress: Not  on file  Relationships  . Social connections:    Talks on phone: Not on file    Gets together: Not on file    Attends religious service: Not on file    Active member of club or organization: Not on file    Attends meetings of clubs or organizations: Not on file    Relationship status: Not on file  . Intimate partner violence:    Fear of current or ex partner: Not on file    Emotionally abused: Not on file    Physically abused: Not on file    Forced sexual activity: Not on file  Other Topics Concern  . Not on file  Social History Narrative  . Not on file    FAMILY HISTORY: Family History  Problem Relation Age of Onset  . Cancer Mother 26       colon cancer with brain tumor mets  . Ovarian cancer Mother        dx under 1; d. 49  . Cancer Brother         brain tumor/gliobastoma, multiforme  . Cancer Brother        renal cell carcinoma, colon ca  . Heart attack Father 75       died/CVA  . Heart attack Brother 51  . Hyperlipidemia Unknown   . Breast cancer Sister 75    ALLERGIES:  is allergic to lisinopril; alendronate [alendronate]; and hydroxyzine.  MEDICATIONS:  Current Outpatient Medications  Medication Sig Dispense Refill  . amLODipine (NORVASC) 10 MG tablet TAKE 1 TABLET(10 MG) BY MOUTH DAILY 90 tablet 1  . Clobetasol Prop Emollient Base (CLOBETASOL PROPIONATE E) 0.05 % emollient cream Apply 1 application topically 2 (two) times daily. 45 g 2  . letrozole (FEMARA) 2.5 MG tablet Take 1 tablet (2.5 mg total) by mouth daily. 90 tablet 3  . RED YEAST RICE EXTRACT PO Take 1 tablet by mouth daily.    . simvastatin (ZOCOR) 20 MG tablet TAKE 1 TABLET BY MOUTH EVERY NIGHT AT BEDTIME 90 tablet 3   No current facility-administered medications for this visit.     REVIEW OF SYSTEMS:   Constitutional: Denies fevers, chills or abnormal night sweats Eyes: Denies blurriness of vision, double vision or watery eyes Ears, nose, mouth, throat, and face: Denies mucositis or sore throat Respiratory: Denies cough, dyspnea or wheezes Cardiovascular: Denies palpitation, chest discomfort or lower extremity swelling Gastrointestinal:  Denies nausea, heartburn or change in bowel habits Skin: Denies abnormal skin rashes Lymphatics: Denies new lymphadenopathy or easy bruising Neurological:Denies numbness, tingling or new weaknesses Behavioral/Psych: Mood is stable, no new changes  Breast: Palpable right breast lump All other systems were reviewed with the patient and are negative.  PHYSICAL EXAMINATION: ECOG PERFORMANCE STATUS: 1 - Symptomatic but completely ambulatory  Vitals:   05/12/18 0912  BP: 138/81  Pulse: 65  Resp: 20  Temp: (!) 97.5 F (36.4 C)  SpO2: 100%   Filed Weights   05/12/18 0912  Weight: 155 lb 6.4 oz (70.5 kg)     GENERAL:alert, no distress and comfortable SKIN: skin color, texture, turgor are normal, no rashes or significant lesions EYES: normal, conjunctiva are pink and non-injected, sclera clear OROPHARYNX:no exudate, no erythema and lips, buccal mucosa, and tongue normal  NECK: supple, thyroid normal size, non-tender, without nodularity LYMPH:  no palpable lymphadenopathy in the cervical, axillary or inguinal LUNGS: clear to auscultation and percussion with normal breathing effort HEART: regular rate & rhythm and no murmurs and no  lower extremity edema ABDOMEN:abdomen soft, non-tender and normal bowel sounds Musculoskeletal:no cyanosis of digits and no clubbing  PSYCH: alert & oriented x 3 with fluent speech NEURO: no focal motor/sensory deficits BREAST palpable lump in the right breast no palpable axillary or supraclavicular lymphadenopathy (exam performed in the presence of a chaperone)   LABORATORY DATA:  I have reviewed the data as listed Lab Results  Component Value Date   WBC 5.8 04/15/2013   HGB 14.3 04/15/2013   HCT 42.0 04/15/2013   MCV 88.4 04/15/2013   PLT 302 04/15/2013   Lab Results  Component Value Date   NA 144 04/09/2018   K 4.1 04/09/2018   CL 107 04/09/2018   CO2 28 04/09/2018    RADIOGRAPHIC STUDIES: I have personally reviewed the radiological reports and agreed with the findings in the report.  ASSESSMENT AND PLAN:  Malignant neoplasm of upper-outer quadrant of right breast in female, estrogen receptor positive ([place]) 04/23/2018:Palpable lump in right breast UOQ, mammogram revealed 2 separate masses.  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 After reviewing the pathology in detail, we proceeded to discuss the different treatment options between surgery, radiation, chemotherapy, antiestrogen therapies.  Recommendation: 1.  Oncotype testing to determine if she would benefit from chemo 2.  Neoadjuvant antiestrogen therapy versus neoadjuvant chemotherapy based upon Oncotype 3.  Followed by breast conserving surgery versus mastectomy depending on response 4.  Adjuvant radiation therapy 5.  Followed by adjuvant antiestrogen therapy  I started her on letrozole neo adjuvantly until we have the results of Oncotype testing.  Letrozole counseling:We discussed the risks and benefits of anti-estrogen therapy with aromatase inhibitors. These include but not limited to insomnia, hot flashes, mood changes, vaginal dryness, bone density loss, and weight gain. We strongly believe that the benefits far outweigh the risks. Patient understands these risks and consented to starting treatment.   We also plan to obtain CT chest abdomen pelvis and bone scan. Patient already has breast MRI scheduled. Return to clinic based upon scan and Oncotype test results.    All questions were answered. The patient knows to call the clinic with any problems, questions or concerns.    Harriette Ohara, MD 05/12/18

## 2018-05-13 ENCOUNTER — Ambulatory Visit
Admission: RE | Admit: 2018-05-13 | Discharge: 2018-05-13 | Disposition: A | Discharge status: Home / Self Care | Payer: Medicare Other | Source: Ambulatory Visit | Attending: Family Medicine | Admitting: Family Medicine

## 2018-05-13 DIAGNOSIS — R928 Other abnormal and inconclusive findings on diagnostic imaging of breast: Secondary | ICD-10-CM | POA: Diagnosis not present

## 2018-05-13 DIAGNOSIS — Z853 Personal history of malignant neoplasm of breast: Secondary | ICD-10-CM

## 2018-05-13 DIAGNOSIS — C50911 Malignant neoplasm of unspecified site of right female breast: Secondary | ICD-10-CM | POA: Diagnosis not present

## 2018-05-16 ENCOUNTER — Ambulatory Visit
Admission: RE | Admit: 2018-05-16 | Discharge: 2018-05-16 | Disposition: A | Discharge status: Home / Self Care | Payer: Medicare Other | Source: Ambulatory Visit | Attending: Radiation Oncology | Admitting: Radiation Oncology

## 2018-05-16 ENCOUNTER — Other Ambulatory Visit: Payer: Self-pay

## 2018-05-16 ENCOUNTER — Encounter: Payer: Self-pay | Admitting: Radiation Oncology

## 2018-05-16 VITALS — BP 133/80 | HR 65 | Temp 98.1°F | Resp 20 | Ht 64.0 in | Wt 154.6 lb

## 2018-05-16 DIAGNOSIS — Z170 Estrogen receptor positive status [ER+]: Secondary | ICD-10-CM | POA: Diagnosis not present

## 2018-05-16 DIAGNOSIS — Z79899 Other long term (current) drug therapy: Secondary | ICD-10-CM | POA: Diagnosis not present

## 2018-05-16 DIAGNOSIS — C50411 Malignant neoplasm of upper-outer quadrant of right female breast: Secondary | ICD-10-CM

## 2018-05-16 DIAGNOSIS — Z803 Family history of malignant neoplasm of breast: Secondary | ICD-10-CM | POA: Diagnosis not present

## 2018-05-16 DIAGNOSIS — Z87891 Personal history of nicotine dependence: Secondary | ICD-10-CM | POA: Insufficient documentation

## 2018-05-16 NOTE — Progress Notes (Signed)
Radiation Oncology         [phone] ________________________________  Initial outpatient Consultation  Name: Samantha Cross MRN: [id number]  Date: 05/16/2018  DOB: August 21, 1945  [id number], Rene Kocher, MD  Jovita Kussmaul, MD   REFERRING PHYSICIAN: [name], MD  DIAGNOSIS:    ICD-10-CM   1. Malignant neoplasm of upper-outer quadrant of right breast in female, estrogen receptor positive ([place]) C50.411    Z17.0   Cancer Staging Malignant neoplasm of upper-outer quadrant of right breast in female, estrogen receptor positive ([place]) Staging form: Breast, AJCC 8th Edition - Clinical: Stage IIIA (cT3(m), cN0, cM0, G3, ER+, PR-, HER2-) - Signed by [name], MD on 05/12/2018  CHIEF COMPLAINT: Here to discuss management of right breast cancer  HISTORY OF PRESENT ILLNESS::Samantha Cross is a 72 y.o. female who presented with a palpable lump in her right breast for the past year, with notable increase in size. She was noted to have a suspicious mass in the 10-12 o'clock location of the right breast on ultrasound performed on 04/21/18. Biopsy on date of 04/23/18 showed invasive lobular carcinoma.  ER status: 95%, positive, strong staining intensity; PR status negative, Her2 status negative; Grade 2-3.  She underwent genetic counseling on 05/12/18, and is scheduled to undergo breast MRI on 05/19/18 and chest/abdomen/pelvis CT and bone scan on 05/21/18.  She feels well overall.  PREVIOUS RADIATION THERAPY: No  PAST MEDICAL HISTORY:  has a past medical history of Diverticulosis, Family history of brain cancer, Family history of breast cancer, Family history of colon cancer, Family history of melanoma, Family history of ovarian cancer, Hemorrhoids, Hyperlipidemia, Hypertension, and Postmenopausal.    PAST SURGICAL HISTORY: Past Surgical History:  Procedure Laterality Date  . ABDOMINAL HYSTERECTOMY     without BSO/heavy periods  . CLEFT PALATE REPAIR    . MIDDLE EAR SURGERY     left    FAMILY HISTORY: family history includes Breast cancer (age of onset: 58) in her sister; Cancer in her brother, brother, and maternal aunt; Colon cancer in her maternal grandmother and maternal uncle; Colon cancer (age of onset: 53) in her mother; Heart attack (age of onset: 80) in her brother; Heart attack (age of onset: 27) in her father; Hyperlipidemia in an other family member; Kidney cancer (age of onset: 74) in her brother; Melanoma in her brother; Ovarian cancer in her mother.  SOCIAL HISTORY:  reports that she quit smoking about 19 years ago. She has never used smokeless tobacco. She reports current alcohol use of about 3.0 standard drinks of alcohol per week. She reports that she does not use drugs.  ALLERGIES: Lisinopril; Alendronate [alendronate]; and Hydroxyzine  MEDICATIONS:  Current Outpatient Medications  Medication Sig Dispense Refill  . amLODipine (NORVASC) 10 MG tablet TAKE 1 TABLET(10 MG) BY MOUTH DAILY 90 tablet 1  . letrozole (FEMARA) 2.5 MG tablet Take 1 tablet (2.5 mg total) by mouth daily. 90 tablet 3  . RED YEAST RICE EXTRACT PO Take 1 tablet by mouth daily.    . simvastatin (ZOCOR) 20 MG tablet TAKE 1 TABLET BY MOUTH EVERY NIGHT AT BEDTIME 90 tablet 3  . Clobetasol Prop Emollient Base (CLOBETASOL PROPIONATE E) 0.05 % emollient cream Apply 1 application topically 2 (two) times daily. (Patient not taking: Reported on 05/16/2018) 45 g 2   No current facility-administered medications for this encounter.     REVIEW OF SYSTEMS: A 10+ POINT REVIEW OF SYSTEMS WAS OBTAINED including neurology, dermatology, psychiatry, cardiac, respiratory, lymph, extremities,  GI, GU, Musculoskeletal, constitutional, breasts, reproductive, HEENT.  All pertinent positives are noted in the HPI.  All others are negative.   PHYSICAL EXAM:  height is _0  (1.626 m) and weight is 154 lb 9.6 oz (70.1 kg). Her oral temperature is 98.1 F (36.7 C). Her blood pressure is 133/80 and her pulse is  65. Her respiration is 20 and oxygen saturation is 99%.    General: Alert and oriented, in no acute distress HEENT: Head is normocephalic. Extraocular movements are intact. Oropharynx is clear. Neck: Neck is supple, no palpable cervical or supraclavicular lymphadenopathy. Heart: Regular in rate and rhythm with no murmurs, rubs, or gallops. Chest: Clear to auscultation bilaterally, with no rhonchi, wheezes, or rales. Abdomen: Soft, nontender, nondistended, with no rigidity or guarding. Extremities: No cyanosis or edema. Lymphatics: see Neck Exam Skin: No concerning lesions. Musculoskeletal: symmetric strength and muscle tone throughout. Neurologic: Cranial nerves II through XII are grossly intact. No obvious focalities. Speech is fluent. Coordination is intact. Psychiatric: Judgment and insight are intact. Affect is appropriate. Breasts: there is a 5 cm mass at the 11-1 o'clock region of the right breast. No other palpable masses appreciated in the breasts or axillae bilaterally.  ECOG = 0  0 - Asymptomatic (Fully active, able to carry on all predisease activities without restriction)  1 - Symptomatic but completely ambulatory (Restricted in physically strenuous activity but ambulatory and able to carry out work of a light or sedentary nature. For example, light housework, office work)  2 - Symptomatic, <50% in bed during the day (Ambulatory and capable of all self care but unable to carry out any work activities. Up and about more than 50% of waking hours)  3 - Symptomatic, >50% in bed, but not bedbound (Capable of only limited self-care, confined to bed or chair 50% or more of waking hours)  4 - Bedbound (Completely disabled. Cannot carry on any self-care. Totally confined to bed or chair)  5 - Death   Eustace Pen MM, Creech RH, Tormey DC, et al. [phone]). "Toxicity and response criteria of the [facility]". [place] Oncol. 5 (6): 649-55   LABORATORY DATA:  Lab  Results  Component Value Date   WBC 5.8 04/15/2013   HGB 14.3 04/15/2013   HCT 42.0 04/15/2013   MCV 88.4 04/15/2013   PLT 302 04/15/2013   CMP     Component Value Date/Time   NA 144 04/09/2018 0934   K 4.1 04/09/2018 0934   CL 107 04/09/2018 0934   CO2 28 04/09/2018 0934   GLUCOSE 103 (H) 04/09/2018 0934   BUN 13 04/09/2018 0934   CREATININE 0.78 04/09/2018 0934   CALCIUM 9.8 04/09/2018 0934   PROT 7.3 04/09/2018 0934   ALBUMIN 4.5 10/09/2016 0915   AST 14 04/09/2018 0934   ALT 15 04/09/2018 0934   ALKPHOS 80 10/09/2016 0915   BILITOT 0.9 04/09/2018 0934   GFRNONAA 78 04/09/2018 0934   GFRAA 90 04/09/2018 0934         RADIOGRAPHY: US Breast Ltd Uni Right Inc Axilla  Result Date: 04/21/2018 CLINICAL DATA:  Palpable abnormality in the RIGHT breast for 1 year, changed in size. Slight indentation when the arm is raised per technologist report. EXAM: DIGITAL DIAGNOSTIC RIGHT MAMMOGRAM WITH CAD AND TOMO ULTRASOUND RIGHT BREAST COMPARISON:  05/12/2017 and earlier ACR Breast Density Category b: There are scattered areas of fibroglandular density. FINDINGS: Within the UPPER central and UPPER OUTER QUADRANT of the RIGHT breast, there is  asymmetric density associated with distortion. A group of calcifications is identified in the 12 o'clock location. BB marks area of patient's concern which corresponds to the 12 o'clock location. Mammographic images were processed with CAD. On physical exam, I palpate discrete mass in the 12 o'clock location of the RIGHT breast. I palpate soft thickening in the 10 o'clock location of the RIGHT breast. Targeted ultrasound is performed, showing irregular hypoechoic mass which extends from the 12 o'clock location to the 10 o'clock location, corresponding to the palpable abnormalities. Mass measures at least 2.1 x 5.1 by 5.3 centimeters. There is associated internal blood flow. Evaluation of the RIGHT axilla is negative for adenopathy. IMPRESSION: Suspicious mass  in the 10-12 o'clock location of the RIGHT breast which biopsy is indicated. I would recommend biopsy of the 10 o'clock and 12 o'clock locations. RECOMMENDATION: Ultrasound-guided core biopsy at 2 sites in the RIGHT breast. I have discussed the findings and recommendations with the patient. Results were also provided in writing at the conclusion of the visit. If applicable, a reminder letter will be sent to the patient regarding the next appointment. BI-RADS CATEGORY  4: Suspicious. Electronically Signed   By: Nolon Nations M.D.   On: 04/21/2018 10:23   Mm Diag Breast Tomo Uni Left  Result Date: 05/13/2018 CLINICAL DATA:  72 year old female with a recent diagnosis of right breast cancer presents today for annual evaluation of the left breast. EXAM: DIGITAL DIAGNOSTIC UNILATERAL LEFT MAMMOGRAM WITH CAD AND TOMO COMPARISON:  Previous exam(s). ACR Breast Density Category b: There are scattered areas of fibroglandular density. FINDINGS: No suspicious mammographic findings are identified. The parenchymal pattern is unchanged. Mammographic images were processed with CAD. IMPRESSION: No mammographic evidence of malignancy. RECOMMENDATION: Continue treatment plan for the patient's right breast cancer. The patient is scheduled for a bilateral breast MRI on 05/19/2018. I have discussed the findings and recommendations with the patient. Results were also provided in writing at the conclusion of the visit. If applicable, a reminder letter will be sent to the patient regarding the next appointment. BI-RADS CATEGORY  1: Negative. Electronically Signed   By: Kristopher Oppenheim M.D.   On: 05/13/2018 11:20   Mm Diag Breast Tomo Uni Right  Result Date: 04/21/2018 CLINICAL DATA:  Palpable abnormality in the RIGHT breast for 1 year, changed in size. Slight indentation when the arm is raised per technologist report. EXAM: DIGITAL DIAGNOSTIC RIGHT MAMMOGRAM WITH CAD AND TOMO ULTRASOUND RIGHT BREAST COMPARISON:  05/12/2017 and  earlier ACR Breast Density Category b: There are scattered areas of fibroglandular density. FINDINGS: Within the UPPER central and UPPER OUTER QUADRANT of the RIGHT breast, there is asymmetric density associated with distortion. A group of calcifications is identified in the 12 o'clock location. BB marks area of patient's concern which corresponds to the 12 o'clock location. Mammographic images were processed with CAD. On physical exam, I palpate discrete mass in the 12 o'clock location of the RIGHT breast. I palpate soft thickening in the 10 o'clock location of the RIGHT breast. Targeted ultrasound is performed, showing irregular hypoechoic mass which extends from the 12 o'clock location to the 10 o'clock location, corresponding to the palpable abnormalities. Mass measures at least 2.1 x 5.1 by 5.3 centimeters. There is associated internal blood flow. Evaluation of the RIGHT axilla is negative for adenopathy. IMPRESSION: Suspicious mass in the 10-12 o'clock location of the RIGHT breast which biopsy is indicated. I would recommend biopsy of the 10 o'clock and 12 o'clock locations. RECOMMENDATION: Ultrasound-guided core biopsy at  2 sites in the RIGHT breast. I have discussed the findings and recommendations with the patient. Results were also provided in writing at the conclusion of the visit. If applicable, a reminder letter will be sent to the patient regarding the next appointment. BI-RADS CATEGORY  4: Suspicious. Electronically Signed   By: Nolon Nations M.D.   On: 04/21/2018 10:23   Mm Clip Placement Right  Result Date: 04/23/2018 CLINICAL DATA:  Confirmation of clip placement after ultrasound-guided core needle biopsy of 2 separate masses involving the UPPER (12 o'clock location) and UPPER OUTER (10 o'clock location) RIGHT breast. EXAM: DIAGNOSTIC RIGHT MAMMOGRAM POST ULTRASOUND BIOPSY COMPARISON:  Previous exam(s). FINDINGS: Mammographic images were obtained following ultrasound guided biopsy of 2  separate masses involving the UPPER and UPPER OUTER RIGHT breast. The ribbon shaped tissue marker clip is appropriately positioned at the site of the biopsy at 12 o'clock location and the coil shaped tissue marker clip is appropriately positioned at the site of the biopsy at the 10 o'clock location. The ribbon shaped clip is adjacent to a group of calcifications. The clips are approximately 4.5 cm apart. Expected post biopsy changes are present without evidence hematoma. IMPRESSION: 1. Appropriate positioning of the ribbon shaped tissue marker clip at the site of the biopsied mass at 12 o'clock location. 2. Appropriate positioning of the coil shaped tissue marker clip at the site of the biopsied mass at 10 o'clock location. 3. The distance between the two clips is approximately 4.5 cm. Final Assessment: Post Procedure Mammograms for Marker Placement Electronically Signed   By: Evangeline Dakin M.D.   On: 04/23/2018 14:03   [place] Rt Breast Bx W Loc Dev 1st Lesion Img Bx Spec US Guide  Addendum Date: 04/25/2018   ADDENDUM REPORT: 04/25/2018 08:40 ADDENDUM: Pathology revealed GRADE II-III INVASIVE MAMMARY CARCINOMA, INTERMEDIATE NUCLEAR GRADE of the RIGHT breast, both locations, upper outer quadrant, 12 o'clock 3 cm from nipple and 10 o'clock 6 cm from nipple. This was found to be concordant by Dr. Peggye Fothergill. Pathology results were discussed with the patient by telephone. The patient reported doing well after the biopsies with tenderness at the sites. Post biopsy instructions and care were reviewed and questions were answered. The patient was encouraged to call [facility] for any additional concerns. Surgical consultation has been arranged with Dr. Autumn Messing at [facility] on May 06, 2018. Localization considerations: For the purpose of surgical planning, the distance between the biopsy clips in the [place] of the RIGHT breast is approximately 4.5 cm.  Pathology results reported by Terie Purser, RN on 04/25/2018. Electronically Signed   By: Evangeline Dakin M.D.   On: 04/25/2018 08:40   Result Date: 04/25/2018 CLINICAL DATA:  72 year old presenting with a palpable lump in the UPPER OUTER RIGHT breast, shown on diagnostic workup to have two separate RIGHT breast masses, the larger at the 12 o'clock position approximately 3 cm from nipple measuring approximately 5.1 cm and the smaller at 10 o'clock position approximately 6 cm from the nipple measuring approximately 2.0 cm. Both masses are biopsied. Patient states a family history of breast cancer in her sister who was diagnosed at age 70. EXAM: ULTRASOUND GUIDED RIGHT BREAST CORE NEEDLE BIOPSY x 2 COMPARISON:  Previous exam(s). FINDINGS: I met with the patient and we discussed the procedure of ultrasound-guided biopsy, including benefits and alternatives. We discussed the high likelihood of a successful procedure. We discussed the risks of the procedure, including infection, bleeding,  tissue injury, clip migration, and inadequate sampling. Informed written consent was given. The usual time-out protocol was performed immediately prior to the procedure. Lesion quadrant: UPPER OUTER QUADRANT for both masses. Using sterile technique with chlorhexidine as skin antisepsis, 1% lidocaine and 1% lidocaine with epinephrine as local anesthetic, under direct ultrasound visualization, a 12 gauge Bard Marquee core needle device placed through an 11 gauge introducer needle was initially used to perform biopsy of the mass involving the UPPER breast at the approximate 12 o'clock location using an INFERIOR approach. At the conclusion of the procedure a ribbon shaped tissue marker clip was deployed into the biopsy cavity. Using the same dermatotomy site, again using 1% lidocaine and 1% lidocaine with epinephrine as local anesthetic, under direct ultrasound visualization, a 12 gauge Bard Marquee core needle device placed through an  11 gauge introducer needle was used to perform biopsy of the mass involving the UPPER OUTER QUADRANT at the approximate 10 o'clock location. At the conclusion of the procedure a coil shaped tissue marker clip was deployed into the biopsy cavity. Follow up 2 view mammogram was performed and dictated separately. IMPRESSION: Ultrasound guided biopsy of 2 separate masses involving the UPPER RIGHT breast, one located at the 12 o'clock position and the other at the 10 o'clock position. No apparent complications. Electronically Signed: By: Evangeline Dakin M.D. On: 04/23/2018 14:02   [place] Rt Breast Bx W Loc Dev Ea Add Lesion Img Bx Spec US Guide  Addendum Date: 04/25/2018   ADDENDUM REPORT: 04/25/2018 08:40 ADDENDUM: Pathology revealed GRADE II-III INVASIVE MAMMARY CARCINOMA, INTERMEDIATE NUCLEAR GRADE of the RIGHT breast, both locations, upper outer quadrant, 12 o'clock 3 cm from nipple and 10 o'clock 6 cm from nipple. This was found to be concordant by Dr. Peggye Fothergill. Pathology results were discussed with the patient by telephone. The patient reported doing well after the biopsies with tenderness at the sites. Post biopsy instructions and care were reviewed and questions were answered. The patient was encouraged to call [facility] for any additional concerns. Surgical consultation has been arranged with Dr. Autumn Messing at [facility] on May 06, 2018. Localization considerations: For the purpose of surgical planning, the distance between the biopsy clips in the [place] of the RIGHT breast is approximately 4.5 cm. Pathology results reported by Terie Purser, RN on 04/25/2018. Electronically Signed   By: Evangeline Dakin M.D.   On: 04/25/2018 08:40   Result Date: 04/25/2018 CLINICAL DATA:  72 year old presenting with a palpable lump in the UPPER OUTER RIGHT breast, shown on diagnostic workup to have two separate RIGHT breast masses, the larger at the 12  o'clock position approximately 3 cm from nipple measuring approximately 5.1 cm and the smaller at 10 o'clock position approximately 6 cm from the nipple measuring approximately 2.0 cm. Both masses are biopsied. Patient states a family history of breast cancer in her sister who was diagnosed at age 48. EXAM: ULTRASOUND GUIDED RIGHT BREAST CORE NEEDLE BIOPSY x 2 COMPARISON:  Previous exam(s). FINDINGS: I met with the patient and we discussed the procedure of ultrasound-guided biopsy, including benefits and alternatives. We discussed the high likelihood of a successful procedure. We discussed the risks of the procedure, including infection, bleeding, tissue injury, clip migration, and inadequate sampling. Informed written consent was given. The usual time-out protocol was performed immediately prior to the procedure. Lesion quadrant: UPPER OUTER QUADRANT for both masses. Using sterile technique with chlorhexidine as skin antisepsis, 1% lidocaine and 1%  lidocaine with epinephrine as local anesthetic, under direct ultrasound visualization, a 12 gauge Bard Marquee core needle device placed through an 11 gauge introducer needle was initially used to perform biopsy of the mass involving the UPPER breast at the approximate 12 o'clock location using an INFERIOR approach. At the conclusion of the procedure a ribbon shaped tissue marker clip was deployed into the biopsy cavity. Using the same dermatotomy site, again using 1% lidocaine and 1% lidocaine with epinephrine as local anesthetic, under direct ultrasound visualization, a 12 gauge Bard Marquee core needle device placed through an 11 gauge introducer needle was used to perform biopsy of the mass involving the UPPER OUTER QUADRANT at the approximate 10 o'clock location. At the conclusion of the procedure a coil shaped tissue marker clip was deployed into the biopsy cavity. Follow up 2 view mammogram was performed and dictated separately. IMPRESSION: Ultrasound guided  biopsy of 2 separate masses involving the UPPER RIGHT breast, one located at the 12 o'clock position and the other at the 10 o'clock position. No apparent complications. Electronically Signed: By: Evangeline Dakin M.D. On: 04/23/2018 14:02      IMPRESSION/PLAN: Right Breast Cancer  Her MRI is pending. This will help determine if she is a candidate for lumpectomy or if mastectomy is necessary. The patient and I talked about the indications for post-mastectomy radiation. She also understands that radiotherapy would automatically recommended following lumpectomy. There is a good likelihood that she will need to see me back to discuss and plan radiotherapy post-operatively. I will review her final pathology with the team before a final recommendation is established. I will see her back as warranted.  It was a pleasure meeting the patient today. We discussed the risks, benefits, and side effects of radiotherapy. I recommend radiotherapy to the right breast to reduce her risk of locoregional recurrence by 2/3.  We discussed that radiation would take approximately 4-6 weeks to complete and that I would give the patient a few weeks to heal following surgery before starting treatment planning. If chemotherapy were to be given, this would precede radiotherapy. We spoke about acute effects including skin irritation and fatigue as well as much less common late effects including internal organ injury or irritation. We spoke about the latest technology that is used to minimize the risk of late effects for patients undergoing radiotherapy to the breast or chest wall. No guarantees of treatment were given. The patient is enthusiastic about proceeding with treatment. I look forward to participating in the patient's care.  I will await her referral back to me for postoperative follow-up and eventual CT simulation/treatment planning.    __________________________________________   [name], MD   This document  serves as a record of services personally performed by [name], MD. It was created on his behalf by Wilburn Mylar, a trained medical scribe. The creation of this record is based on the scribe's personal observations and the provider's statements to them. This document has been checked and approved by the attending provider.

## 2018-05-19 ENCOUNTER — Ambulatory Visit
Admission: RE | Admit: 2018-05-19 | Discharge: 2018-05-19 | Disposition: A | Discharge status: Home / Self Care | Payer: Medicare Other | Source: Ambulatory Visit | Attending: General Surgery | Admitting: General Surgery

## 2018-05-19 DIAGNOSIS — N6311 Unspecified lump in the right breast, upper outer quadrant: Secondary | ICD-10-CM | POA: Diagnosis not present

## 2018-05-19 DIAGNOSIS — Z170 Estrogen receptor positive status [ER+]: Principal | ICD-10-CM

## 2018-05-19 DIAGNOSIS — C50411 Malignant neoplasm of upper-outer quadrant of right female breast: Secondary | ICD-10-CM

## 2018-05-19 DIAGNOSIS — N6312 Unspecified lump in the right breast, upper inner quadrant: Secondary | ICD-10-CM | POA: Diagnosis not present

## 2018-05-19 MED ORDER — GADOBUTROL 1 MMOL/ML IV SOLN
7.0000 mL | Freq: Once | INTRAVENOUS | Status: AC | PRN
  Administered 2018-05-19: 7 mL via INTRAVENOUS

## 2018-05-21 ENCOUNTER — Ambulatory Visit (HOSPITAL_COMMUNITY)
Admission: RE | Admit: 2018-05-21 | Discharge: 2018-05-21 | Disposition: A | Discharge status: Home / Self Care | Payer: Medicare Other | Source: Ambulatory Visit | Attending: Hematology and Oncology | Admitting: Hematology and Oncology

## 2018-05-21 ENCOUNTER — Encounter (HOSPITAL_COMMUNITY): Payer: Self-pay

## 2018-05-21 ENCOUNTER — Telehealth: Payer: Self-pay | Admitting: *Deleted

## 2018-05-21 ENCOUNTER — Encounter (HOSPITAL_COMMUNITY)
Admission: RE | Admit: 2018-05-21 | Discharge: 2018-05-21 | Disposition: A | Discharge status: Home / Self Care | Payer: Medicare Other | Source: Ambulatory Visit | Attending: Hematology and Oncology | Admitting: Hematology and Oncology

## 2018-05-21 DIAGNOSIS — C50411 Malignant neoplasm of upper-outer quadrant of right female breast: Secondary | ICD-10-CM | POA: Insufficient documentation

## 2018-05-21 DIAGNOSIS — Z170 Estrogen receptor positive status [ER+]: Secondary | ICD-10-CM | POA: Insufficient documentation

## 2018-05-21 DIAGNOSIS — C50919 Malignant neoplasm of unspecified site of unspecified female breast: Secondary | ICD-10-CM | POA: Diagnosis not present

## 2018-05-21 DIAGNOSIS — C7800 Secondary malignant neoplasm of unspecified lung: Secondary | ICD-10-CM | POA: Diagnosis not present

## 2018-05-21 DIAGNOSIS — C50911 Malignant neoplasm of unspecified site of right female breast: Secondary | ICD-10-CM | POA: Diagnosis not present

## 2018-05-21 HISTORY — DX: Malignant (primary) neoplasm, unspecified: C80.1

## 2018-05-21 MED ORDER — IOHEXOL 300 MG/ML  SOLN
100.0000 mL | Freq: Once | INTRAMUSCULAR | Status: AC | PRN
  Administered 2018-05-21: 100 mL via INTRAVENOUS

## 2018-05-21 MED ORDER — TECHNETIUM TC 99M MEDRONATE IV KIT
20.6000 | PACK | Freq: Once | INTRAVENOUS | Status: AC | PRN
  Administered 2018-05-21: 20.6 via INTRAVENOUS

## 2018-05-21 MED ORDER — SODIUM CHLORIDE (PF) 0.9 % IJ SOLN
INTRAMUSCULAR | Status: AC
  Filled 2018-05-21: qty 50

## 2018-05-21 NOTE — Telephone Encounter (Signed)
Received oncotype score of 24/10%. Physician team notified. Left vm for pt to return call to discuss results.

## 2018-05-22 ENCOUNTER — Telehealth: Payer: Self-pay | Admitting: Genetic Counselor

## 2018-05-22 ENCOUNTER — Telehealth: Payer: Self-pay | Admitting: *Deleted

## 2018-05-22 ENCOUNTER — Telehealth: Payer: Self-pay | Admitting: Hematology and Oncology

## 2018-05-22 ENCOUNTER — Encounter: Payer: Self-pay | Admitting: Genetic Counselor

## 2018-05-22 DIAGNOSIS — Z719 Counseling, unspecified: Secondary | ICD-10-CM | POA: Insufficient documentation

## 2018-05-22 DIAGNOSIS — Z1379 Encounter for other screening for genetic and chromosomal anomalies: Secondary | ICD-10-CM | POA: Insufficient documentation

## 2018-05-22 DIAGNOSIS — Z0100 Encounter for examination of eyes and vision without abnormal findings: Secondary | ICD-10-CM | POA: Insufficient documentation

## 2018-05-22 NOTE — Telephone Encounter (Signed)
I informed the patient that the CT chest abdomen pelvis does not show any evidence of metastatic disease other than arthritis.  There was a lesion in the right kidney which could be a cyst and the radiologist is recommending renal protocol contrast MRI. We will order this test to be done in the new year.

## 2018-05-22 NOTE — Telephone Encounter (Signed)
Revealed negative genetic testing.  Discussed that we do not know why she has breast cancer or why there is cancer in the family. It could be due to a different gene that we are not testing, or maybe our current technology may not be able to pick something up.  It will be important for her to keep in contact with genetics to keep up with whether additional testing may be needed.  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  This will not change medical management

## 2018-05-22 NOTE — Telephone Encounter (Signed)
Discussed oncotype score results with pt. Informed based on the current findings she does not need chemotherapy. Denies questions or needs at this time.

## 2018-05-23 ENCOUNTER — Ambulatory Visit: Payer: Self-pay | Admitting: Genetic Counselor

## 2018-05-23 ENCOUNTER — Other Ambulatory Visit: Payer: Self-pay

## 2018-05-23 DIAGNOSIS — Z1379 Encounter for other screening for genetic and chromosomal anomalies: Secondary | ICD-10-CM

## 2018-05-23 DIAGNOSIS — N2889 Other specified disorders of kidney and ureter: Secondary | ICD-10-CM

## 2018-05-23 NOTE — Progress Notes (Signed)
HPI:  Samantha Cross was previously seen in the [facility] due to a personal and family history of cancer and concerns regarding a hereditary predisposition to cancer. Please refer to our prior cancer genetics clinic note for more information regarding Samantha Cross's medical, social and family histories, and our assessment and recommendations, at the time. Samantha Cross recent genetic test results were disclosed to her, as were recommendations warranted by these results. These results and recommendations are discussed in more detail below.  CANCER HISTORY:    Malignant neoplasm of upper-outer quadrant of right breast in female, estrogen receptor positive ([place])   04/23/2018 Initial Diagnosis    Palpable lump in right breast UOQ, mammogram revealed 2 separate masses.  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  The Multi-Gene Panel offered by [facility] includes sequencing and/or deletion duplication testing of the following 85 genes: AIP, ALK, APC, ATM, AXIN2,BAP1,  BARD1, BLM, BMPR1A, BRCA1, BRCA2, BRIP1, CASR, CDC73, CDH1, CDK4, CDKN1B, CDKN1C, CDKN2A (p14ARF), CDKN2A (p16INK4a), CEBPA, CHEK2, CTNNA1, DICER1, DIS3L2, EGFR (c.2369C>T, p.Thr790Met variant only), EPCAM (Deletion/duplication testing only), FH, FLCN, GATA2, GPC3, GREM1 (Promoter region deletion/duplication testing only), HOXB13 (c.251G>A, p.Gly84Glu), HRAS, KIT, MAX, MEN1, MET, MITF (c.952G>A, p.Glu318Lys variant only), MLH1, MSH2, MSH3, MSH6, MUTYH, NBN, NF1, NF2, NTHL1, PALB2, PDGFRA,  PHOX2B, PMS2, POLD1, POLE, POT1, PRKAR1A, PTCH1, PTEN, RAD50, RAD51C, RAD51D, RB1, RECQL4, RET, RNF43, RUNX1, SDHAF2, SDHA (sequence changes only), SDHB, SDHC, SDHD, SMAD4, SMARCA4, SMARCB1, SMARCE1, STK11, SUFU, TERC, TERT, TMEM127, TP53, TSC1, TSC2, VHL, WRN and WT1.        FAMILY HISTORY:  We obtained a detailed, 4-generation family history.  Significant diagnoses are listed below: Family History  Problem Relation Age of Onset  . Ovarian cancer Mother        dx under 42; d. 43  . Colon cancer Mother 47  . Cancer Brother        brain tumor/gliobastoma, multiforme  . Melanoma Brother   . Kidney cancer Brother 74  . Cancer Brother        renal cell carcinoma, colon ca  . Heart attack Father 77       died/CVA  . Heart attack Brother 65  . Hyperlipidemia Other   . Breast cancer Sister 78  . Cancer Maternal Aunt        NOS  . Colon cancer Maternal Uncle        dx in his 34s  . Colon cancer Maternal Grandmother        dx over 56    The patient has a son and daughter who are cancer free.  She has one sister and three brothers.  Her sister was diagnosed with breast cancer at age 38.  One brother was diagnosed with colon cancer in his 62's, and another brother was diagnosed with melanoma, kidney and brain cancer and died at 10.  Both parents are deceased.  The patient's father died at 28 from heart disease. He had 12 siblings who are all cancer free.  The paternal grandparents are deceased from non cancer related issues.  The patient's mother was diagnosed with colon and ovarian cancer and died at 60.  She had a brother and sister.  Her brother died of colon cancer and his sister died of a cancer NOS.  The maternal grandparents are both deceased.  The grandmother had colon cancer in her 7's-60's.  Samantha Cross is unaware of previous family history of genetic testing for hereditary cancer risks. Patient's maternal ancestors are of European descent, and paternal ancestors are of  Caucasian descent. There is reported Ashkenazi Jewish ancestry.  The maternal grandparents moved to [place] during [place] and became [employer]. There is no known consanguinity.  GENETIC TEST RESULTS: Genetic testing reported out on May 18, 2018 through the multic-cancer panel found no deleterious mutations.  The Multi-Gene Panel offered by [facility] includes sequencing and/or deletion duplication testing of the following 85 genes: AIP, ALK, APC, ATM, AXIN2,BAP1,  BARD1, BLM, BMPR1A, BRCA1, BRCA2, BRIP1, CASR, CDC73, CDH1, CDK4, CDKN1B, CDKN1C, CDKN2A (p14ARF), CDKN2A (p16INK4a), CEBPA, CHEK2, CTNNA1, DICER1, DIS3L2, EGFR (c.2369C>T, p.Thr790Met variant only), EPCAM (Deletion/duplication testing only), FH, FLCN, GATA2, GPC3, GREM1 (Promoter region deletion/duplication testing only), HOXB13 (c.251G>A, p.Gly84Glu), HRAS, KIT, MAX, MEN1, MET, MITF (c.952G>A, p.Glu318Lys variant only), MLH1, MSH2, MSH3, MSH6, MUTYH, NBN, NF1, NF2, NTHL1, PALB2, PDGFRA, PHOX2B, PMS2, POLD1, POLE, POT1, PRKAR1A, PTCH1, PTEN, RAD50, RAD51C, RAD51D, RB1, RECQL4, RET, RNF43, RUNX1, SDHAF2, SDHA (sequence changes only), SDHB, SDHC, SDHD, SMAD4, SMARCA4, SMARCB1, SMARCE1, STK11, SUFU, TERC, TERT, TMEM127, TP53, TSC1, TSC2, VHL, WRN and WT1.  The test report has been scanned into EPIC and is located under the Molecular Pathology section of the Results Review tab.    We discussed with Samantha Cross that since the current genetic testing is not perfect, it is possible there may be a gene mutation in one of these genes that current testing cannot detect, but that chance is small.  We also discussed, that it is possible that another gene that has not yet been discovered, or that we have not yet tested, is responsible for the cancer diagnoses in the family, and it is, therefore, important to remain in touch with cancer genetics in the future so that we can continue to offer Samantha Cross the most up to date genetic testing.   Genetic testing  did detect a Variant of Unknown Significance in the SDHA gene called c.955A>C. At this time, it is unknown if this variant is associated with increased cancer risk or if this is a normal finding, but most variants such as this get reclassified to being inconsequential. It should not be used to make medical management decisions. With time, we suspect the lab will determine the significance of this variant, if any. If we do learn more about it, we will try to contact Samantha Cross to discuss it further. However, it is important to stay in touch with [place] periodically and keep the address and phone number up to date.   CANCER SCREENING RECOMMENDATIONS: This result is reassuring and indicates that Samantha Cross likely does not have an increased risk for a future cancer due to a mutation in one of these genes. This normal test also suggests that Samantha Cross's cancer was most likely not due to an inherited predisposition associated with one of these genes.  Most cancers happen by chance and this negative test suggests that her cancer falls into this category.  We, therefore, recommended she continue to follow the cancer management and screening guidelines provided by her  oncology and primary healthcare provider.   An individual's cancer risk and medical management are not determined by genetic test results alone. Overall cancer risk assessment incorporates additional factors, including personal medical history, family history, and any available genetic information that may result in a personalized plan for cancer prevention and surveillance.  RECOMMENDATIONS FOR FAMILY MEMBERS:  Women in this family might be at some increased risk of developing cancer, over the general population risk, simply due to the family history of cancer.  We recommended women in this family have a yearly mammogram beginning at age 38, or 69 years younger than the earliest onset of cancer, an annual clinical breast exam, and perform monthly  breast self-exams. Women in this family should also have a gynecological exam as recommended by their primary provider. All family members should have a colonoscopy by age 71.  FOLLOW-UP: Lastly, we discussed with Samantha Cross that cancer genetics is a rapidly advancing field and it is possible that new genetic tests will be appropriate for her and/or her family members in the future. We encouraged her to remain in contact with cancer genetics on an annual basis so we can update her personal and family histories and let her know of advances in cancer genetics that may benefit this family.   Our contact number was provided. Samantha Cross questions were answered to her satisfaction, and she knows she is welcome to call us at anytime with additional questions or concerns.   Samantha Kayser, MS, [facility] Certified Genetic Counselor Samantha [email or web address]

## 2018-05-23 NOTE — Progress Notes (Signed)
Contacted MRI to verify that patient needed MRI with renal protocol.  Recommendation for MRI abd w and without contrast to view kidney.

## 2018-05-26 ENCOUNTER — Telehealth: Payer: Self-pay | Admitting: Hematology and Oncology

## 2018-05-26 NOTE — Telephone Encounter (Signed)
Scheduled appt per 12/20 sch message - pt is aware of apt date and time

## 2018-05-27 ENCOUNTER — Encounter: Payer: Self-pay | Admitting: Hematology and Oncology

## 2018-06-06 ENCOUNTER — Ambulatory Visit (HOSPITAL_COMMUNITY)
Admission: RE | Admit: 2018-06-06 | Discharge: 2018-06-06 | Disposition: A | Discharge status: Home / Self Care | Payer: Medicare Other | Source: Ambulatory Visit | Attending: Hematology and Oncology | Admitting: Hematology and Oncology

## 2018-06-06 DIAGNOSIS — N289 Disorder of kidney and ureter, unspecified: Secondary | ICD-10-CM | POA: Diagnosis not present

## 2018-06-06 DIAGNOSIS — N2889 Other specified disorders of kidney and ureter: Secondary | ICD-10-CM | POA: Insufficient documentation

## 2018-06-06 MED ORDER — GADOBUTROL 1 MMOL/ML IV SOLN
7.0000 mL | Freq: Once | INTRAVENOUS | Status: AC | PRN
  Administered 2018-06-06: 7 mL via INTRAVENOUS

## 2018-06-09 ENCOUNTER — Telehealth: Payer: Self-pay | Admitting: Hematology and Oncology

## 2018-06-09 NOTE — Telephone Encounter (Signed)
MRI of the abdomen: Complex cystic lesion left kidney does not appear to show concerning findings a follow-up CT or MRI in 6 months was recommended.  I informed her of this result.

## 2018-06-12 DIAGNOSIS — Z170 Estrogen receptor positive status [ER+]: Secondary | ICD-10-CM | POA: Diagnosis not present

## 2018-06-12 DIAGNOSIS — C50411 Malignant neoplasm of upper-outer quadrant of right female breast: Secondary | ICD-10-CM | POA: Diagnosis not present

## 2018-07-14 ENCOUNTER — Inpatient Hospital Stay: Payer: Medicare Other | Attending: Hematology and Oncology | Admitting: Hematology and Oncology

## 2018-07-14 ENCOUNTER — Telehealth: Payer: Self-pay | Admitting: Hematology and Oncology

## 2018-07-14 DIAGNOSIS — Z923 Personal history of irradiation: Secondary | ICD-10-CM | POA: Insufficient documentation

## 2018-07-14 DIAGNOSIS — Z79899 Other long term (current) drug therapy: Secondary | ICD-10-CM | POA: Diagnosis not present

## 2018-07-14 DIAGNOSIS — Z170 Estrogen receptor positive status [ER+]: Secondary | ICD-10-CM | POA: Diagnosis not present

## 2018-07-14 DIAGNOSIS — C50411 Malignant neoplasm of upper-outer quadrant of right female breast: Secondary | ICD-10-CM | POA: Diagnosis not present

## 2018-07-14 DIAGNOSIS — Z79811 Long term (current) use of aromatase inhibitors: Secondary | ICD-10-CM | POA: Diagnosis not present

## 2018-07-14 MED ORDER — GABAPENTIN 300 MG PO CAPS: 300.0000 mg | ORAL_CAPSULE | Freq: Every day | ORAL | Status: DC

## 2018-07-14 NOTE — Progress Notes (Signed)
Patient Care Team: [name], MD as PCP - General (Family Medicine)  DIAGNOSIS:  Encounter Diagnosis  Name Primary?  . Malignant neoplasm of upper-outer quadrant of right breast in female, estrogen receptor positive ([place])     SUMMARY OF ONCOLOGIC HISTORY:   Malignant neoplasm of upper-outer quadrant of right breast in female, estrogen receptor positive ([place])   04/23/2018 Initial Diagnosis    Palpable lump in right breast UOQ, mammogram revealed 2 separate masses.  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  The Multi-Gene Panel offered by [facility] includes sequencing and/or deletion duplication testing of the following 85 genes: AIP, ALK, APC, ATM, AXIN2,BAP1,  BARD1, BLM, BMPR1A, BRCA1, BRCA2, BRIP1, CASR, CDC73, CDH1, CDK4, CDKN1B, CDKN1C, CDKN2A (p14ARF), CDKN2A (p16INK4a), CEBPA, CHEK2, CTNNA1, DICER1, DIS3L2, EGFR (c.2369C>T, p.Thr790Met variant only), EPCAM (Deletion/duplication testing only), FH, FLCN, GATA2, GPC3, GREM1 (Promoter region deletion/duplication testing only), HOXB13 (c.251G>A, p.Gly84Glu), HRAS, KIT, MAX, MEN1, MET, MITF (c.952G>A, p.Glu318Lys variant only), MLH1, MSH2, MSH3, MSH6, MUTYH, NBN, NF1, NF2, NTHL1, PALB2, PDGFRA, PHOX2B, PMS2, POLD1, POLE, POT1, PRKAR1A, PTCH1, PTEN, RAD50, RAD51C, RAD51D, RB1, RECQL4, RET, RNF43, RUNX1, SDHAF2, SDHA (sequence changes only), SDHB, SDHC, SDHD, SMAD4, SMARCA4, SMARCB1, SMARCE1, STK11, SUFU,  TERC, TERT, TMEM127, TP53, TSC1, TSC2, VHL, WRN and WT1.        CHIEF COMPLIANT: Follow-up on neoadjuvant therapy with letrozole  INTERVAL HISTORY: Samantha Cross is a 73 year old with above-mentioned history of right breast cancer currently neoadjuvant therapy with letrozole.  She is tolerating letrozole fairly well.  She does have occasional hot flashes and arthralgias myalgias.  Denies any other concerns.  REVIEW OF SYSTEMS:   Constitutional: Denies fevers, chills or abnormal weight loss Eyes: Denies blurriness of vision Ears, nose, mouth, throat, and face: Denies mucositis or sore throat Respiratory: Denies cough, dyspnea or wheezes Cardiovascular: Denies palpitation, chest discomfort Gastrointestinal:  Denies nausea, heartburn or change in bowel habits Skin: Denies abnormal skin rashes Lymphatics: Denies new lymphadenopathy or easy bruising Neurological:Denies numbness, tingling or new weaknesses Behavioral/Psych: Mood is stable, no new changes  Extremities: No lower extremity edema All other systems were reviewed with the patient and are negative.  I have reviewed the past medical history, past surgical history, social history and family history with the patient and they are unchanged from previous note.  ALLERGIES:  is allergic to lisinopril; alendronate [alendronate]; and hydroxyzine.  MEDICATIONS:  Current Outpatient Medications  Medication Sig Dispense Refill  . amLODipine (NORVASC) 10 MG tablet TAKE 1 TABLET(10 MG) BY MOUTH DAILY 90 tablet 1  . Clobetasol Prop Emollient Base (CLOBETASOL PROPIONATE E) 0.05 % emollient cream Apply 1 application topically 2 (two) times daily. (Patient not taking: Reported on 05/16/2018) 45 g 2  . gabapentin (NEURONTIN) 300 MG capsule Take 1 capsule (300 mg total) by mouth at bedtime.    Marland Kitchen letrozole (FEMARA) 2.5 MG tablet Take 1 tablet (2.5 mg total) by mouth  daily. 90 tablet 3  . RED YEAST RICE EXTRACT PO Take 1 tablet by mouth daily.      . simvastatin (ZOCOR) 20 MG tablet TAKE 1 TABLET BY MOUTH EVERY NIGHT AT BEDTIME 90 tablet 3   No current facility-administered medications for this visit.     PHYSICAL EXAMINATION: ECOG PERFORMANCE STATUS: 1 - Symptomatic but completely ambulatory  Vitals:   07/14/18 0945  BP: (!) 144/73  Pulse: 60  Resp: 17  Temp: 98.3 F (36.8 C)  SpO2: 100%   Filed Weights   07/14/18 0945  Weight: 154 lb 1.6 oz (69.9 kg)    GENERAL:alert, no distress and comfortable SKIN: skin color, texture, turgor are normal, no rashes or significant lesions EYES: normal, Conjunctiva are pink and non-injected, sclera clear OROPHARYNX:no exudate, no erythema and lips, buccal mucosa, and tongue normal  NECK: supple, thyroid normal size, non-tender, without nodularity LYMPH:  no palpable lymphadenopathy in the cervical, axillary or inguinal LUNGS: clear to auscultation and percussion with normal breathing effort HEART: regular rate & rhythm and no murmurs and no lower extremity edema ABDOMEN:abdomen soft, non-tender and normal bowel sounds MUSCULOSKELETAL:no cyanosis of digits and no clubbing  NEURO: alert & oriented x 3 with fluent speech, no focal motor/sensory deficits EXTREMITIES: No lower extremity edema   LABORATORY DATA:  I have reviewed the data as listed CMP Latest Ref Rng & Units 04/09/2018 10/09/2016 11/08/2015  Glucose 65 - 99 mg/dL 103(H) 95 97  BUN 7 - 25 mg/dL '13 12 11  ' Creatinine 0.50 - 0.99 mg/dL 0.78 0.72 0.69  Sodium 135 - 146 mmol/L 144 143 140  Potassium 3.5 - 5.3 mmol/L 4.1 4.2 4.3  Chloride 98 - 110 mmol/L 107 105 105  CO2 20 - 32 mmol/L '28 22 25  ' Calcium 8.6 - 10.4 mg/dL 9.8 9.8 9.4  Total Protein 6.1 - 8.1 g/dL 7.3 7.8 7.2  Total Bilirubin 0.2 - 1.2 mg/dL 0.9 0.8 0.8  Alkaline Phos 33 - 130 U/L - 80 81  AST 10 - 35 U/L '14 17 17  ' ALT 6 - 29 U/L '15 15 16    ' Lab Results  Component Value Date   WBC 5.8 04/15/2013   HGB 14.3 04/15/2013   HCT 42.0 04/15/2013   MCV 88.4  04/15/2013   PLT 302 04/15/2013   NEUTROABS 2.8 04/15/2013    ASSESSMENT & PLAN:  Malignant neoplasm of upper-outer quadrant of right breast in female, estrogen receptor positive ([place]) 04/23/2018:Palpable lump in right breast UOQ, mammogram revealed 2 separate masses.  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  Oncotype testing: 24 no benefit from chemotherapy. 2.  Neoadjuvant antiestrogen therapy  3.  Followed by breast conserving surgery versus mastectomy depending on response 4.  Adjuvant radiation therapy 5.  Followed by adjuvant antiestrogen therapy ----------------------------------------------------------------------------------------------------------------------------------------------- Current treatment: Neoadjuvant letrozole started 05/12/2018 Letrozole toxicities: Occasional hot flashes and occasional arthralgias. She has difficulty sleeping and instructed to take the medication at bedtime.  We will plan to obtain a mammogram and ultrasound next month.  Return to clinic after  the mammogram to discuss the results   Orders Placed This Encounter  Procedures  . US BREAST LTD UNI RIGHT INC AXILLA    Standing Status:   Future    Standing Expiration Date:   09/12/2019    Order Specific Question:   Reason for Exam (SYMPTOM  OR DIAGNOSIS REQUIRED)    Answer:   Neoadjuvant Anti estrogen therapy    Order Specific Question:   Preferred imaging location?    Answer:   [facility]  . MM DIAG BREAST TOMO UNI RIGHT    Standing Status:   Future     Standing Expiration Date:   07/15/2019    Order Specific Question:   Reason for Exam (SYMPTOM  OR DIAGNOSIS REQUIRED)    Answer:   Neo adj Anti estrogen Therapy    Order Specific Question:   Preferred imaging location?    Answer:   [facility]   The patient has a good understanding of the overall plan. she agrees with it. she will call with any problems that may develop before the next visit here.   Harriette Ohara, MD 07/14/18

## 2018-07-14 NOTE — Assessment & Plan Note (Signed)
04/23/2018:Palpable lump in right breast UOQ, mammogram revealed 2 separate masses.  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  Oncotype testing: 24 no benefit from chemotherapy. 2.  Neoadjuvant antiestrogen therapy  3.  Followed by breast conserving surgery versus mastectomy depending on response 4.  Adjuvant radiation therapy 5.  Followed by adjuvant antiestrogen therapy ----------------------------------------------------------------------------------------------------------------------------------------------- Current treatment: Neoadjuvant letrozole started 05/12/2018 Letrozole toxicities:  We will plan to obtain a mammogram and ultrasound next month.  

## 2018-07-14 NOTE — Telephone Encounter (Signed)
Gave avs and calendar ° °

## 2018-08-11 ENCOUNTER — Ambulatory Visit
Admission: RE | Admit: 2018-08-11 | Discharge: 2018-08-11 | Disposition: A | Discharge status: Home / Self Care | Payer: Medicare Other | Source: Ambulatory Visit | Attending: Hematology and Oncology | Admitting: Hematology and Oncology

## 2018-08-11 DIAGNOSIS — Z170 Estrogen receptor positive status [ER+]: Principal | ICD-10-CM

## 2018-08-11 DIAGNOSIS — R922 Inconclusive mammogram: Secondary | ICD-10-CM | POA: Diagnosis not present

## 2018-08-11 DIAGNOSIS — C50411 Malignant neoplasm of upper-outer quadrant of right female breast: Secondary | ICD-10-CM

## 2018-08-12 NOTE — Progress Notes (Signed)
Patient Care Team: [name], MD as PCP - General (Family Medicine)  DIAGNOSIS:    ICD-10-CM   1. Malignant neoplasm of upper-outer quadrant of right breast in female, estrogen receptor positive ([place]) C50.411 MR BREAST BILATERAL W [place] CAD   Z17.0     SUMMARY OF ONCOLOGIC HISTORY:   Malignant neoplasm of upper-outer quadrant of right breast in female, estrogen receptor positive ([place])   04/23/2018 Initial Diagnosis    Palpable lump in right breast UOQ, mammogram revealed 2 separate masses.  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  The Multi-Gene Panel offered by [facility] includes sequencing and/or deletion duplication testing of the following 85 genes: AIP, ALK, APC, ATM, AXIN2,BAP1,  BARD1, BLM, BMPR1A, BRCA1, BRCA2, BRIP1, CASR, CDC73, CDH1, CDK4, CDKN1B, CDKN1C, CDKN2A (p14ARF), CDKN2A (p16INK4a), CEBPA, CHEK2, CTNNA1, DICER1, DIS3L2, EGFR (c.2369C>T, p.Thr790Met variant only), EPCAM (Deletion/duplication testing only), FH, FLCN, GATA2, GPC3, GREM1 (Promoter region deletion/duplication testing only), HOXB13 (c.251G>A, p.Gly84Glu), HRAS, KIT, MAX, MEN1, MET, MITF (c.952G>A, p.Glu318Lys variant only), MLH1, MSH2, MSH3, MSH6, MUTYH, NBN, NF1, NF2, NTHL1, PALB2, PDGFRA, PHOX2B, PMS2, POLD1, POLE, POT1, PRKAR1A, PTCH1, PTEN, RAD50, RAD51C, RAD51D, RB1, RECQL4, RET, RNF43, RUNX1, SDHAF2, SDHA (sequence changes only), SDHB, SDHC, SDHD, SMAD4,  SMARCA4, SMARCB1, SMARCE1, STK11, SUFU, TERC, TERT, TMEM127, TP53, TSC1, TSC2, VHL, WRN and WT1.        CHIEF COMPLIANT: Follow-up on neoadjuvant therapy with letrozole  INTERVAL HISTORY: Samantha Cross is a 73 y.o. with above-mentioned history of right breast cancer currently on neoadjuvant therapy with letrozole. A mammogram and [place] from 08/11/18 showed a decrease in the right breast mass from 5.3cm to 3.8cm and no abnormalities in the right axilla. She presents to the clinic today with her husband. She notes pain in her hip, and has a history of bursitis and arthritis, that has caused difficulty sleeping and moderate hot flashes.   REVIEW OF SYSTEMS:   Constitutional: Denies fevers, chills or abnormal weight loss (+) hot flashes Eyes: Denies blurriness of vision Ears, nose, mouth, throat, and face: Denies mucositis or sore throat Respiratory: Denies cough, dyspnea or wheezes Cardiovascular: Denies palpitation, chest discomfort Gastrointestinal: Denies nausea, heartburn or change in bowel habits Skin: Denies abnormal skin rashes MSK: (+) hip pain Lymphatics: Denies new lymphadenopathy or easy bruising Neurological: Denies numbness, tingling or new weaknesses Behavioral/Psych: Mood is stable, no new changes (+) difficulty sleeping Extremities: No lower extremity edema Breast: denies any pain or lumps or nodules in either breasts All other systems were reviewed with the patient and are negative.  I have reviewed the past medical history, past surgical history, social history and family history with the patient and they are unchanged from previous note.  ALLERGIES:  is allergic to lisinopril; alendronate [alendronate]; and hydroxyzine.  MEDICATIONS:  Current Outpatient Medications  Medication Sig Dispense Refill   amLODipine (NORVASC) 10 MG tablet TAKE 1 TABLET(10 MG) BY  MOUTH DAILY 90 tablet 1   Clobetasol Prop Emollient Base (CLOBETASOL PROPIONATE E) 0.05 % emollient cream Apply 1  application topically 2 (two) times daily. (Patient not taking: Reported on 05/16/2018) 45 g 2   gabapentin (NEURONTIN) 300 MG capsule Take 1 capsule (300 mg total) by mouth at bedtime.     letrozole (FEMARA) 2.5 MG tablet Take 1 tablet (2.5 mg total) by mouth daily. 90 tablet 3   RED YEAST RICE EXTRACT PO Take 1 tablet by mouth daily.     simvastatin (ZOCOR) 20 MG tablet TAKE 1 TABLET BY MOUTH EVERY NIGHT AT BEDTIME 90 tablet 3   No current facility-administered medications for this visit.     PHYSICAL EXAMINATION: ECOG PERFORMANCE STATUS: 1 - Symptomatic but completely ambulatory  Vitals:   08/14/18 1009  BP: 127/72  Pulse: 60  Resp: 17  Temp: 97.8 F (36.6 C)  SpO2: 100%   Filed Weights   08/14/18 1009  Weight: 153 lb 4.8 oz (69.5 kg)    GENERAL: alert, no distress and comfortable SKIN: skin color, texture, turgor are normal, no rashes or significant lesions EYES: normal, Conjunctiva are pink and non-injected, sclera clear OROPHARYNX: no exudate, no erythema and lips, buccal mucosa, and tongue normal  NECK: supple, thyroid normal size, non-tender, without nodularity LYMPH: no palpable lymphadenopathy in the cervical, axillary or inguinal LUNGS: clear to auscultation and percussion with normal breathing effort HEART: regular rate & rhythm and no murmurs and no lower extremity edema ABDOMEN: abdomen soft, non-tender and normal bowel sounds MUSCULOSKELETAL: no cyanosis of digits and no clubbing  NEURO: alert & oriented x 3 with fluent speech, no focal motor/sensory deficits EXTREMITIES: No lower extremity edema  LABORATORY DATA:  I have reviewed the data as listed CMP Latest Ref Rng & Units 04/09/2018 10/09/2016 11/08/2015  Glucose 65 - 99 mg/dL 103(H) 95 97  BUN 7 - 25 mg/dL '13 12 11  ' Creatinine 0.50 - 0.99 mg/dL 0.78 0.72 0.69  Sodium 135 - 146 mmol/L 144 143 140  Potassium 3.5 - 5.3 mmol/L 4.1 4.2 4.3  Chloride 98 - 110 mmol/L 107 105 105  CO2 20 - 32 mmol/L '28 22 25   ' Calcium 8.6 - 10.4 mg/dL 9.8 9.8 9.4  Total Protein 6.1 - 8.1 g/dL 7.3 7.8 7.2  Total Bilirubin 0.2 - 1.2 mg/dL 0.9 0.8 0.8  Alkaline Phos 33 - 130 U/L - 80 81  AST 10 - 35 U/L '14 17 17  ' ALT 6 - 29 U/L '15 15 16    ' Lab Results  Component Value Date   WBC 5.8 04/15/2013   HGB 14.3 04/15/2013   HCT 42.0 04/15/2013   MCV 88.4 04/15/2013   PLT 302 04/15/2013   NEUTROABS 2.8 04/15/2013    ASSESSMENT & PLAN:  Malignant neoplasm of upper-outer quadrant of right breast in female, estrogen receptor positive ([place]) 04/23/2018:Palpable lump in right breast UOQ, mammogram revealed 2 separate masses. 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 2.Neoadjuvant antiestrogen therapy  3.Followed by breast conserving  surgery versus mastectomy depending on response 4.Adjuvant radiation therapy 5.Followed by adjuvant antiestrogen therapy ----------------------------------------------------------------------------------------------------------------------------------------------- Current treatment: Neoadjuvant letrozole started 05/12/2018 Letrozole toxicities: Occasional hot flashes and occasional arthralgias. She has difficulty sleeping and instructed to take the medication at bedtime.   mammogram and ultrasound 08/11/18: Interval decrease in the size of the mass from 5.3 cm to 3.8 cm  Return to clinic in 3 months with breast MRI and follow-up    Orders Placed This Encounter  Procedures   MR BREAST  BILATERAL [place] CAD    Standing Status:   Future    Standing Expiration Date:   10/14/2019    Order Specific Question:   ** REASON FOR EXAM (FREE TEXT)    Answer:   Post neo adj anti estrogen therapy    Order Specific Question:   If indicated for the ordered procedure, I authorize the administration of contrast media per Radiology protocol    Answer:   Yes    Order Specific Question:   What is the patient's sedation requirement?    Answer:   No Sedation    Order Specific Question:   Does the patient have a pacemaker or implanted devices?    Answer:   No    Order Specific Question:   Radiology Contrast Protocol - do NOT remove file path    Answer:   [email or web address]    Order Specific Question:   Preferred imaging location?    Answer:   [facility] (table limit-550lbs)   The patient has a good understanding of the overall plan. she agrees with it. she will call with any problems that may develop before the next visit here.  [name], MD 08/14/2018  Samantha Cross am acting as scribe for Dr. [name].  I have reviewed the above documentation for accuracy and completeness, and I agree with the above.

## 2018-08-14 ENCOUNTER — Inpatient Hospital Stay: Payer: Medicare Other | Attending: Hematology and Oncology | Admitting: Hematology and Oncology

## 2018-08-14 ENCOUNTER — Telehealth: Payer: Self-pay | Admitting: Hematology and Oncology

## 2018-08-14 DIAGNOSIS — Z79811 Long term (current) use of aromatase inhibitors: Secondary | ICD-10-CM | POA: Diagnosis not present

## 2018-08-14 DIAGNOSIS — Z170 Estrogen receptor positive status [ER+]: Secondary | ICD-10-CM | POA: Diagnosis not present

## 2018-08-14 DIAGNOSIS — C50411 Malignant neoplasm of upper-outer quadrant of right female breast: Secondary | ICD-10-CM | POA: Diagnosis not present

## 2018-08-14 DIAGNOSIS — Z79899 Other long term (current) drug therapy: Secondary | ICD-10-CM | POA: Diagnosis not present

## 2018-08-14 NOTE — Telephone Encounter (Signed)
Gave avs and calendar ° °

## 2018-08-14 NOTE — Assessment & Plan Note (Addendum)
04/23/2018:Palpable lump in right breast UOQ, mammogram revealed 2 separate masses. 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 2.Neoadjuvant antiestrogen therapy  3.Followed by breast conserving surgery versus mastectomy depending on response 4.Adjuvant radiation therapy 5.Followed by adjuvant antiestrogen therapy ----------------------------------------------------------------------------------------------------------------------------------------------- Current treatment: Neoadjuvant letrozole started 05/12/2018 Letrozole toxicities: Occasional hot flashes and occasional arthralgias. She has difficulty sleeping and instructed to take the medication at bedtime.   mammogram and ultrasound 08/11/18: Interval decrease in the size of the mass from 5.3 cm to 3.8 cm  Return to clinic in 3 months with breast MRI and follow-up

## 2018-08-28 ENCOUNTER — Telehealth: Payer: Self-pay | Admitting: Hematology and Oncology

## 2018-08-28 ENCOUNTER — Other Ambulatory Visit: Payer: Self-pay

## 2018-08-28 ENCOUNTER — Ambulatory Visit
Admission: RE | Admit: 2018-08-28 | Discharge: 2018-08-28 | Disposition: A | Discharge status: Home / Self Care | Payer: Medicare Other | Source: Ambulatory Visit | Attending: Hematology and Oncology | Admitting: Hematology and Oncology

## 2018-08-28 DIAGNOSIS — Z170 Estrogen receptor positive status [ER+]: Principal | ICD-10-CM

## 2018-08-28 DIAGNOSIS — C50411 Malignant neoplasm of upper-outer quadrant of right female breast: Secondary | ICD-10-CM | POA: Diagnosis not present

## 2018-08-28 MED ORDER — GADOBUTROL 1 MMOL/ML IV SOLN
7.0000 mL | Freq: Once | INTRAVENOUS | Status: AC | PRN
  Administered 2018-08-28: 7 mL via INTRAVENOUS

## 2018-08-28 NOTE — Telephone Encounter (Signed)
I informed the patient that the MRI of the breast shows continued shrinkage of the tumor.  It appears that her surgery appointments have been canceled and that she is going to see Dr. [name] in May. I discussed with her that because of the excellent results with antiestrogen therapy it would be safe for her to continue on with the same treatment until the current pandemic episode passes so that she can have her surgery at that time. Patient is very happy to have received a phone call.  She is tolerating the antiestrogen therapy extremely well.  I will be seeing her after surgery is complete.

## 2018-09-01 ENCOUNTER — Other Ambulatory Visit: Payer: Medicare Other

## 2018-10-08 ENCOUNTER — Ambulatory Visit: Payer: Medicare Other | Admitting: Family Medicine

## 2018-10-08 ENCOUNTER — Ambulatory Visit: Payer: Self-pay | Admitting: General Surgery

## 2018-10-08 DIAGNOSIS — C50411 Malignant neoplasm of upper-outer quadrant of right female breast: Secondary | ICD-10-CM

## 2018-10-08 DIAGNOSIS — Z170 Estrogen receptor positive status [ER+]: Principal | ICD-10-CM

## 2018-10-09 ENCOUNTER — Other Ambulatory Visit: Payer: Self-pay | Admitting: Family Medicine

## 2018-10-09 NOTE — Telephone Encounter (Signed)
Spoke to pt about doing a virtual visit for f/u on her BP. (noticed that she had cancelled her appt on yesterday). She said that she is going thru so much right now and the reason that she cancelled her appointment yesterday was because she had to see the oncologist and it was just to much for her.   I told her that I understood and that I noticed that her next appt w/ONC wasn't until 6/15 and that we could actually make an appt for her today and she possibly wouldn't need to f/u with Dr. Madilyn Fireman for 6 months or sooner if her oncologist felt that she should be seen sooner.  She still declined but appreciated the offer.  I told her that I will inform Dr. Madilyn Fireman of her wishes at this time and if she should need anything to please call us. She thanked me.  Sending refill to pharmacy.  Maryruth Eve, [place] Crocker, CMA

## 2018-10-09 NOTE — Telephone Encounter (Signed)
Agree with above 

## 2018-10-21 ENCOUNTER — Other Ambulatory Visit: Payer: Self-pay | Admitting: General Surgery

## 2018-10-21 ENCOUNTER — Other Ambulatory Visit: Payer: Self-pay | Admitting: *Deleted

## 2018-10-21 DIAGNOSIS — C50411 Malignant neoplasm of upper-outer quadrant of right female breast: Secondary | ICD-10-CM

## 2018-10-21 DIAGNOSIS — Z170 Estrogen receptor positive status [ER+]: Secondary | ICD-10-CM

## 2018-10-23 ENCOUNTER — Telehealth: Payer: Self-pay | Admitting: Hematology and Oncology

## 2018-10-23 NOTE — Telephone Encounter (Signed)
Moved 6/15 appt to 7/31 per sch msg. Called and spoke with patient. Confirmed date and time

## 2018-11-17 ENCOUNTER — Ambulatory Visit: Payer: Medicare Other | Admitting: Hematology and Oncology

## 2018-12-17 ENCOUNTER — Other Ambulatory Visit: Payer: Self-pay

## 2018-12-17 ENCOUNTER — Encounter (HOSPITAL_BASED_OUTPATIENT_CLINIC_OR_DEPARTMENT_OTHER): Payer: Self-pay | Admitting: *Deleted

## 2018-12-18 ENCOUNTER — Encounter (HOSPITAL_BASED_OUTPATIENT_CLINIC_OR_DEPARTMENT_OTHER)
Admission: RE | Admit: 2018-12-18 | Discharge: 2018-12-18 | Disposition: A | Discharge status: Home / Self Care | Payer: Medicare Other | Source: Ambulatory Visit | Attending: General Surgery | Admitting: General Surgery

## 2018-12-18 DIAGNOSIS — Z01810 Encounter for preprocedural cardiovascular examination: Secondary | ICD-10-CM | POA: Insufficient documentation

## 2018-12-18 DIAGNOSIS — Z1159 Encounter for screening for other viral diseases: Secondary | ICD-10-CM | POA: Insufficient documentation

## 2018-12-18 NOTE — Progress Notes (Signed)
Ensure pre surgery drink and surgical soap given with instructions, pt verbalized understanding. 

## 2018-12-20 ENCOUNTER — Other Ambulatory Visit (HOSPITAL_COMMUNITY)
Admission: RE | Admit: 2018-12-20 | Discharge: 2018-12-20 | Disposition: A | Discharge status: Home / Self Care | Payer: Medicare Other | Source: Ambulatory Visit | Attending: General Surgery | Admitting: General Surgery

## 2018-12-20 DIAGNOSIS — Z1159 Encounter for screening for other viral diseases: Secondary | ICD-10-CM | POA: Diagnosis not present

## 2018-12-20 DIAGNOSIS — Z01810 Encounter for preprocedural cardiovascular examination: Secondary | ICD-10-CM | POA: Diagnosis not present

## 2018-12-20 LAB — SARS CORONAVIRUS 2 (TAT 6-24 HRS): SARS Coronavirus 2: NEGATIVE

## 2018-12-23 ENCOUNTER — Ambulatory Visit
Admission: RE | Admit: 2018-12-23 | Discharge: 2018-12-23 | Disposition: A | Discharge status: Home / Self Care | Payer: Medicare Other | Source: Ambulatory Visit | Attending: General Surgery | Admitting: General Surgery

## 2018-12-23 ENCOUNTER — Other Ambulatory Visit: Payer: Self-pay | Admitting: General Surgery

## 2018-12-23 ENCOUNTER — Other Ambulatory Visit: Payer: Self-pay

## 2018-12-23 DIAGNOSIS — C50411 Malignant neoplasm of upper-outer quadrant of right female breast: Secondary | ICD-10-CM

## 2018-12-23 DIAGNOSIS — C50911 Malignant neoplasm of unspecified site of right female breast: Secondary | ICD-10-CM | POA: Diagnosis not present

## 2018-12-23 DIAGNOSIS — Z170 Estrogen receptor positive status [ER+]: Secondary | ICD-10-CM

## 2018-12-24 ENCOUNTER — Encounter (HOSPITAL_BASED_OUTPATIENT_CLINIC_OR_DEPARTMENT_OTHER)
Admission: RE | Disposition: A | Discharge status: Home / Self Care | Payer: Self-pay | Source: Home / Self Care | Attending: General Surgery

## 2018-12-24 ENCOUNTER — Ambulatory Visit (HOSPITAL_BASED_OUTPATIENT_CLINIC_OR_DEPARTMENT_OTHER)
Admission: RE | Admit: 2018-12-24 | Discharge: 2018-12-24 | Disposition: A | Discharge status: Home / Self Care | Payer: Medicare Other | Attending: General Surgery | Admitting: General Surgery

## 2018-12-24 ENCOUNTER — Other Ambulatory Visit: Payer: Self-pay

## 2018-12-24 ENCOUNTER — Ambulatory Visit (HOSPITAL_COMMUNITY)
Admission: RE | Admit: 2018-12-24 | Discharge: 2018-12-24 | Disposition: A | Discharge status: Home / Self Care | Payer: Medicare Other | Source: Ambulatory Visit | Attending: General Surgery | Admitting: General Surgery

## 2018-12-24 ENCOUNTER — Encounter (HOSPITAL_BASED_OUTPATIENT_CLINIC_OR_DEPARTMENT_OTHER): Payer: Self-pay | Admitting: Certified Registered"

## 2018-12-24 ENCOUNTER — Ambulatory Visit
Admission: RE | Admit: 2018-12-24 | Discharge: 2018-12-24 | Disposition: A | Discharge status: Home / Self Care | Payer: Medicare Other | Source: Ambulatory Visit | Attending: General Surgery | Admitting: General Surgery

## 2018-12-24 ENCOUNTER — Ambulatory Visit (HOSPITAL_BASED_OUTPATIENT_CLINIC_OR_DEPARTMENT_OTHER): Payer: Medicare Other | Admitting: Certified Registered"

## 2018-12-24 DIAGNOSIS — Z170 Estrogen receptor positive status [ER+]: Secondary | ICD-10-CM | POA: Diagnosis not present

## 2018-12-24 DIAGNOSIS — Z888 Allergy status to other drugs, medicaments and biological substances status: Secondary | ICD-10-CM | POA: Diagnosis not present

## 2018-12-24 DIAGNOSIS — C773 Secondary and unspecified malignant neoplasm of axilla and upper limb lymph nodes: Secondary | ICD-10-CM | POA: Diagnosis not present

## 2018-12-24 DIAGNOSIS — C50411 Malignant neoplasm of upper-outer quadrant of right female breast: Secondary | ICD-10-CM | POA: Diagnosis not present

## 2018-12-24 DIAGNOSIS — Z8249 Family history of ischemic heart disease and other diseases of the circulatory system: Secondary | ICD-10-CM | POA: Diagnosis not present

## 2018-12-24 DIAGNOSIS — Z87891 Personal history of nicotine dependence: Secondary | ICD-10-CM | POA: Diagnosis not present

## 2018-12-24 DIAGNOSIS — E785 Hyperlipidemia, unspecified: Secondary | ICD-10-CM | POA: Diagnosis not present

## 2018-12-24 DIAGNOSIS — Z803 Family history of malignant neoplasm of breast: Secondary | ICD-10-CM | POA: Diagnosis not present

## 2018-12-24 DIAGNOSIS — I10 Essential (primary) hypertension: Secondary | ICD-10-CM | POA: Insufficient documentation

## 2018-12-24 DIAGNOSIS — Z79899 Other long term (current) drug therapy: Secondary | ICD-10-CM | POA: Diagnosis not present

## 2018-12-24 DIAGNOSIS — G8918 Other acute postprocedural pain: Secondary | ICD-10-CM | POA: Diagnosis not present

## 2018-12-24 DIAGNOSIS — C50911 Malignant neoplasm of unspecified site of right female breast: Secondary | ICD-10-CM | POA: Diagnosis not present

## 2018-12-24 HISTORY — PX: BREAST LUMPECTOMY WITH RADIOACTIVE SEED AND SENTINEL LYMPH NODE BIOPSY: SHX6550

## 2018-12-24 HISTORY — DX: Unspecified osteoarthritis, unspecified site: M19.90

## 2018-12-24 SURGERY — BREAST LUMPECTOMY WITH RADIOACTIVE SEED AND SENTINEL LYMPH NODE BIOPSY
Anesthesia: General | Site: Breast | Laterality: Right

## 2018-12-24 MED ORDER — ONDANSETRON HCL 4 MG/2ML IJ SOLN
INTRAMUSCULAR | Status: AC
  Filled 2018-12-24: qty 12

## 2018-12-24 MED ORDER — MIDAZOLAM HCL 2 MG/2ML IJ SOLN
1.0000 mg | INTRAMUSCULAR | Status: DC | PRN
  Administered 2018-12-24: 1 mg via INTRAVENOUS

## 2018-12-24 MED ORDER — DEXAMETHASONE SODIUM PHOSPHATE 4 MG/ML IJ SOLN
INTRAMUSCULAR | Status: DC | PRN
  Administered 2018-12-24: 10 mg via INTRAVENOUS

## 2018-12-24 MED ORDER — DEXAMETHASONE SODIUM PHOSPHATE 10 MG/ML IJ SOLN
INTRAMUSCULAR | Status: AC
  Filled 2018-12-24: qty 2

## 2018-12-24 MED ORDER — SCOPOLAMINE 1 MG/3DAYS TD PT72: 1.0000 | MEDICATED_PATCH | Freq: Once | TRANSDERMAL | Status: DC

## 2018-12-24 MED ORDER — CHLORHEXIDINE GLUCONATE CLOTH 2 % EX PADS: 6.0000 | MEDICATED_PAD | Freq: Once | CUTANEOUS | Status: DC

## 2018-12-24 MED ORDER — ACETAMINOPHEN 500 MG PO TABS
1000.0000 mg | ORAL_TABLET | ORAL | Status: AC
  Administered 2018-12-24: 1000 mg via ORAL

## 2018-12-24 MED ORDER — MIDAZOLAM HCL 2 MG/2ML IJ SOLN
INTRAMUSCULAR | Status: AC
  Filled 2018-12-24: qty 2

## 2018-12-24 MED ORDER — PROPOFOL 500 MG/50ML IV EMUL
INTRAVENOUS | Status: AC
  Filled 2018-12-24: qty 150

## 2018-12-24 MED ORDER — CEFAZOLIN SODIUM-DEXTROSE 2-4 GM/100ML-% IV SOLN
2.0000 g | INTRAVENOUS | Status: AC
  Administered 2018-12-24: 2 g via INTRAVENOUS

## 2018-12-24 MED ORDER — PROPOFOL 500 MG/50ML IV EMUL
INTRAVENOUS | Status: DC | PRN
  Administered 2018-12-24: 25 ug/kg/min via INTRAVENOUS

## 2018-12-24 MED ORDER — FENTANYL CITRATE (PF) 100 MCG/2ML IJ SOLN
INTRAMUSCULAR | Status: AC
  Filled 2018-12-24: qty 2

## 2018-12-24 MED ORDER — ONDANSETRON HCL 4 MG/2ML IJ SOLN
INTRAMUSCULAR | Status: DC | PRN
  Administered 2018-12-24: 4 mg via INTRAVENOUS

## 2018-12-24 MED ORDER — LACTATED RINGERS IV SOLN
INTRAVENOUS | Status: DC
  Administered 2018-12-24 (×2): via INTRAVENOUS

## 2018-12-24 MED ORDER — CELECOXIB 200 MG PO CAPS: 200.0000 mg | ORAL_CAPSULE | ORAL | Status: DC

## 2018-12-24 MED ORDER — BUPIVACAINE LIPOSOME 1.3 % IJ SUSP
INTRAMUSCULAR | Status: DC | PRN
  Administered 2018-12-24: 10 mL

## 2018-12-24 MED ORDER — TECHNETIUM TC 99M SULFUR COLLOID FILTERED
1.0000 | Freq: Once | INTRAVENOUS | Status: AC | PRN
  Administered 2018-12-24: 1 via INTRADERMAL

## 2018-12-24 MED ORDER — CELECOXIB 200 MG PO CAPS
ORAL_CAPSULE | ORAL | Status: AC
  Filled 2018-12-24: qty 1

## 2018-12-24 MED ORDER — CEFAZOLIN SODIUM-DEXTROSE 2-4 GM/100ML-% IV SOLN
INTRAVENOUS | Status: AC
  Filled 2018-12-24: qty 100

## 2018-12-24 MED ORDER — BUPIVACAINE HCL (PF) 0.25 % IJ SOLN
INTRAMUSCULAR | Status: DC | PRN
  Administered 2018-12-24: 5 mL
  Administered 2018-12-24: 10 mL

## 2018-12-24 MED ORDER — GABAPENTIN 300 MG PO CAPS: 300.0000 mg | ORAL_CAPSULE | ORAL | Status: DC

## 2018-12-24 MED ORDER — FENTANYL CITRATE (PF) 100 MCG/2ML IJ SOLN
50.0000 ug | INTRAMUSCULAR | Status: AC | PRN
  Administered 2018-12-24 (×2): 50 ug via INTRAVENOUS
  Administered 2018-12-24: 100 ug via INTRAVENOUS

## 2018-12-24 MED ORDER — GABAPENTIN 300 MG PO CAPS
ORAL_CAPSULE | ORAL | Status: AC
  Filled 2018-12-24: qty 1

## 2018-12-24 MED ORDER — LIDOCAINE HCL (CARDIAC) PF 100 MG/5ML IV SOSY
PREFILLED_SYRINGE | INTRAVENOUS | Status: DC | PRN
  Administered 2018-12-24: 30 mg via INTRAVENOUS

## 2018-12-24 MED ORDER — BUPIVACAINE HCL (PF) 0.5 % IJ SOLN
INTRAMUSCULAR | Status: DC | PRN
  Administered 2018-12-24: 15 mL

## 2018-12-24 MED ORDER — PROPOFOL 10 MG/ML IV BOLUS
INTRAVENOUS | Status: DC | PRN
  Administered 2018-12-24: 120 mg via INTRAVENOUS

## 2018-12-24 MED ORDER — HYDROCODONE-ACETAMINOPHEN 5-325 MG PO TABS: 1.0000 | ORAL_TABLET | Freq: Four times a day (QID) | ORAL | 0 refills | Status: DC | PRN

## 2018-12-24 MED ORDER — ACETAMINOPHEN 500 MG PO TABS
ORAL_TABLET | ORAL | Status: AC
  Filled 2018-12-24: qty 2

## 2018-12-24 SURGICAL SUPPLY — 44 items
APPLIER CLIP 9.375 MED OPEN (MISCELLANEOUS) ×2
BLADE SURG 15 STRL LF DISP TIS (BLADE) ×1 IMPLANT
BLADE SURG 15 STRL SS (BLADE) ×1
CANISTER SUC SOCK COL 7IN (MISCELLANEOUS) IMPLANT
CANISTER SUCT 1200ML W/VALVE (MISCELLANEOUS) ×1 IMPLANT
CHLORAPREP W/TINT 26 (MISCELLANEOUS) ×2 IMPLANT
CLIP APPLIE 9.375 MED OPEN (MISCELLANEOUS) ×1 IMPLANT
COVER BACK TABLE REUSABLE LG (DRAPES) ×2 IMPLANT
COVER MAYO STAND REUSABLE (DRAPES) ×2 IMPLANT
COVER PROBE W GEL 5X96 (DRAPES) ×2 IMPLANT
COVER WAND RF STERILE (DRAPES) IMPLANT
DECANTER SPIKE VIAL GLASS SM (MISCELLANEOUS) ×1 IMPLANT
DERMABOND ADVANCED (GAUZE/BANDAGES/DRESSINGS) ×1
DERMABOND ADVANCED .7 DNX12 (GAUZE/BANDAGES/DRESSINGS) ×1 IMPLANT
DRAPE LAPAROSCOPIC ABDOMINAL (DRAPES) ×2 IMPLANT
DRAPE UTILITY XL STRL (DRAPES) ×2 IMPLANT
ELECT COATED BLADE 2.86 ST (ELECTRODE) ×2 IMPLANT
ELECT REM PT RETURN 9FT ADLT (ELECTROSURGICAL) ×2
ELECTRODE REM PT RTRN 9FT ADLT (ELECTROSURGICAL) ×1 IMPLANT
GLOVE BIO SURGEON STRL SZ7.5 (GLOVE) ×3 IMPLANT
GLOVE BIOGEL PI IND STRL 7.0 (GLOVE) IMPLANT
GLOVE BIOGEL PI INDICATOR 7.0 (GLOVE) ×2
GOWN STRL REUS W/ TWL LRG LVL3 (GOWN DISPOSABLE) ×2 IMPLANT
GOWN STRL REUS W/TWL LRG LVL3 (GOWN DISPOSABLE) ×2
ILLUMINATOR WAVEGUIDE N/F (MISCELLANEOUS) IMPLANT
KIT MARKER MARGIN INK (KITS) ×2 IMPLANT
LIGHT WAVEGUIDE WIDE FLAT (MISCELLANEOUS) IMPLANT
NDL HYPO 25X1 1.5 SAFETY (NEEDLE) ×1 IMPLANT
NDL SAFETY ECLIPSE 18X1.5 (NEEDLE) IMPLANT
NEEDLE HYPO 18GX1.5 SHARP (NEEDLE)
NEEDLE HYPO 25X1 1.5 SAFETY (NEEDLE) ×2 IMPLANT
NS IRRIG 1000ML POUR BTL (IV SOLUTION) IMPLANT
PACK BASIN DAY SURGERY FS (CUSTOM PROCEDURE TRAY) ×2 IMPLANT
PENCIL BUTTON HOLSTER BLD 10FT (ELECTRODE) ×2 IMPLANT
SLEEVE SCD COMPRESS KNEE MED (MISCELLANEOUS) ×2 IMPLANT
SPONGE LAP 18X18 RF (DISPOSABLE) ×2 IMPLANT
SUT MON AB 4-0 PC3 18 (SUTURE) ×4 IMPLANT
SUT SILK 2 0 SH (SUTURE) IMPLANT
SUT VICRYL 3-0 CR8 SH (SUTURE) ×2 IMPLANT
SYR CONTROL 10ML LL (SYRINGE) ×2 IMPLANT
TOWEL GREEN STERILE FF (TOWEL DISPOSABLE) ×2 IMPLANT
TRAY FAXITRON CT DISP (TRAY / TRAY PROCEDURE) ×2 IMPLANT
TUBE CONNECTING 20X1/4 (TUBING) ×1 IMPLANT
YANKAUER SUCT BULB TIP NO VENT (SUCTIONS) ×1 IMPLANT

## 2018-12-24 NOTE — Anesthesia Procedure Notes (Signed)
Anesthesia Regional Block: Pectoralis block   Pre-Anesthetic Checklist: ,, timeout performed, Correct Patient, Correct Site, Correct Laterality, Correct Procedure, Correct Position, site marked, Risks and benefits discussed,  Surgical consent,  Pre-op evaluation,  At surgeon's request and post-op pain management  Laterality: Right  Prep: Maximum Sterile Barrier Precautions used, chloraprep       Needles:  Injection technique: Single-shot  Needle Type: Echogenic Stimulator Needle     Needle Length: 10cm      Additional Needles:   Procedures:,,,, ultrasound used (permanent image in chart),,,,  Narrative:  Start time: 12/24/2018 7:59 AM End time: 12/24/2018 8:09 AM Injection made incrementally with aspirations every 5 mL.  Performed by: Personally  Anesthesiologist: Montez Hageman, MD  Additional Notes: Risks, benefits and alternative to block explained extensively.  Patient tolerated procedure well, without complications.

## 2018-12-24 NOTE — Transfer of Care (Signed)
Immediate Anesthesia Transfer of Care Note  Patient: Samantha Cross [facility]  Procedure(s) Performed: RIGHT BREAST LUMPECTOMY WITH RADIOACTIVE SEED AND SENTINEL LYMPH NODE BIOPSY (Right Breast)  Patient Location: PACU  Anesthesia Type:GA combined with regional for post-op pain  Level of Consciousness: drowsy and patient cooperative  Airway & Oxygen Therapy: Patient Spontanous Breathing and Patient connected to nasal cannula oxygen  Post-op Assessment: Report given to RN and Post -op Vital signs reviewed and stable  Post vital signs: Reviewed and stable  Last Vitals:  Vitals Value Taken Time  BP    Temp    Pulse 77 12/24/18 1002  Resp    SpO2 95 % 12/24/18 1002  Vitals shown include unvalidated device data.  Last Pain:  Vitals:   12/24/18 0657  TempSrc: Oral  PainSc:          Complications: No apparent anesthesia complications

## 2018-12-24 NOTE — Op Note (Addendum)
12/24/2018  9:56 AM  PATIENT:  Samantha Cross  73 y.o. female  PRE-OPERATIVE DIAGNOSIS:  RIGHT BREAST CANCER  POST-OPERATIVE DIAGNOSIS:  RIGHT BREAST CANCER  PROCEDURE:  Procedure(s): RIGHT BREAST LUMPECTOMY WITH RADIOACTIVE SEED LOCALIZATION AND DEEP RIGHT AXILLARY SENTINEL LYMPH NODE BIOPSY (Right)  SURGEON:  Surgeon(s) and Role:    * Jovita Kussmaul, MD - Primary  PHYSICIAN ASSISTANT:   ASSISTANTS: none   ANESTHESIA:   local and general  EBL:  minimal   BLOOD ADMINISTERED:none  DRAINS: none   LOCAL MEDICATIONS USED:  MARCAINE     SPECIMEN:  Source of Specimen:  right breast tissue with sentinel nodes X 2  DISPOSITION OF SPECIMEN:  PATHOLOGY  COUNTS:  YES  TOURNIQUET:  * No tourniquets in log *  DICTATION: .Dragon Dictation   After informed consent was obtained the patient was brought to the operating room and placed in the supine position on the operating table.  After adequate induction of general anesthesia the patient's right chest, breast, and axillary area were prepped with ChloraPrep, allowed to dry, and draped in usual sterile manner.  An appropriate timeout was performed.  Previously to I-125 seeds were placed in the upper outer quadrant of the right breast to bracket an area of invasive breast cancer that was treated with neoadjuvant hormonal therapy.  Also earlier in the day the patient underwent injection 1 mCi technetium sulfur colloid in the subareolar position on the right.  The neoprobe was initially set to technetium in an area of radioactivity was readily identified in the right axilla.  This area was infiltrated with quarter percent Marc[place].  A small transversely oriented incision was made with a 15 blade knife overlying the area of radioactivity.  The incision was carried through the skin and subcutaneous tissue sharply with the electrocautery until the deep right axillary space was entered.  The neoprobe was used to direct blunt hemostat  dissection.  I was able to identify 2 hot lymph nodes and one palpable lymph node that was adjacent to the first hot lymph node.  These nodes were excised sharply with the electrocautery and the lymphatics and small vessels around them were controlled with clips.  The palpable node was sent in the same specimen as sentinel lymph node #1.  Ex vivo counts on the 2 sentinel nodes were approximately 200.  No other hot or palpable lymph nodes were identified in the right axilla.  Hemostasis was achieved using the Bovie electrocautery.  The deep layer of the wound was then closed with interrupted 3-0 Vicryl stitches.  The skin was closed with a running 4-0 Monocryl subcuticular stitch.  Attention was then turned to the right breast.  Because of the size of the area that needed to be excised with the bracketed seeds I elected to make a transversely oriented curvilinear incision overlying the 2 seeds with a 15 blade knife.  The incision was carried through the skin and subcutaneous tissue sharply with the electrocautery.  Dissection was then carried towards the 2 radioactive seeds under the direction of the neoprobe.  Once I more closely approached each of the seeds then I created a nice round lumpectomy around the 2 radioactive seeds while checking the area of radioactivity frequently.  The dissection was carried all the way to the chest wall.  Once this was accomplished the lumpectomy specimen was removed from the patient.  It was oriented with the appropriate paint colors.  A specimen radiograph was obtained that showed the clips  and seeds to be within the specimen.  The specimen was then sent to pathology for further evaluation.  Hemostasis was achieved using the Bovie electrocautery.  The wound was infiltrated with quarter percent Marcaine and irrigated with saline.  The cavity was marked with clips.  Next the deep layer of the wound was closed with interrupted 3-0 Vicryl stitches.  The skin was closed with a running  4-0 Monocryl subcuticular stitch.  Dermabond dressings were applied.  The patient tolerated the procedure well.  At the end of the case all needle sponge and instrument counts were correct.  The patient was then awakened and taken to recovery in stable condition.  PLAN OF CARE: Discharge to home after PACU  PATIENT DISPOSITION:  PACU - hemodynamically stable.   Delay start of Pharmacological VTE agent (>24hrs) due to surgical blood loss or risk of bleeding: not applicable

## 2018-12-24 NOTE — Anesthesia Preprocedure Evaluation (Addendum)
Anesthesia Evaluation  Patient identified by MRN, date of birth, ID band Patient awake    Reviewed: Allergy & Precautions, NPO status , Patient's Chart, lab work & pertinent test results  Airway Mallampati: II  TM Distance: >3 FB Neck ROM: Full    Dental no notable dental hx.    Pulmonary neg pulmonary ROS, former smoker,    Pulmonary exam normal breath sounds clear to auscultation       Cardiovascular hypertension, Pt. on medications negative cardio ROS Normal cardiovascular exam Rhythm:Regular Rate:Normal     Neuro/Psych negative neurological ROS  negative psych ROS   GI/Hepatic negative GI ROS, Neg liver ROS,   Endo/Other    Renal/GU negative Renal ROS  negative genitourinary   Musculoskeletal negative musculoskeletal ROS (+)   Abdominal   Peds negative pediatric ROS (+)  Hematology negative hematology ROS (+)   Anesthesia Other Findings   Reproductive/Obstetrics negative OB ROS                            Anesthesia Physical Anesthesia Plan  ASA: II  Anesthesia Plan: General   Post-op Pain Management:  Regional for Post-op pain   Induction: Intravenous  PONV Risk Score and Plan: 3 and Ondansetron, Dexamethasone and Treatment may vary due to age or medical condition  Airway Management Planned: LMA  Additional Equipment:   Intra-op Plan:   Post-operative Plan:   Informed Consent: I have reviewed the patients History and Physical, chart, labs and discussed the procedure including the risks, benefits and alternatives for the proposed anesthesia with the patient or authorized representative who has indicated his/her understanding and acceptance.     Dental advisory given  Plan Discussed with: CRNA  Anesthesia Plan Comments:         Anesthesia Quick Evaluation

## 2018-12-24 NOTE — Interval H&P Note (Signed)
History and Physical Interval Note:  12/24/2018 7:59 AM  Samantha Cross  has presented today for surgery, with the diagnosis of RIGHT BREAST CANCER.  The various methods of treatment have been discussed with the patient and family. After consideration of risks, benefits and other options for treatment, the patient has consented to  Procedure(s): RIGHT BREAST LUMPECTOMY WITH RADIOACTIVE SEED AND SENTINEL LYMPH NODE BIOPSY (Right) as a surgical intervention.  The patient's history has been reviewed, patient examined, no change in status, stable for surgery.  I have reviewed the patient's chart and labs.  Questions were answered to the patient's satisfaction.     [name]

## 2018-12-24 NOTE — Progress Notes (Signed)
Assisted Dr. Marcell Barlow with right, ultrasound guided, pectoralis block. And nuc med injection Side rails up, monitors on throughout procedure. See vital signs in flow sheet. Tolerated Procedure well.

## 2018-12-24 NOTE — Discharge Instructions (Signed)
No Tylenol or ibuprofen until 1:00pm    Post Anesthesia Home Care Instructions  Activity: Get plenty of rest for the remainder of the day. A responsible individual must stay with you for 24 hours following the procedure.  For the next 24 hours, DO NOT: -Drive a car -Paediatric nurse -Drink alcoholic beverages -Take any medication unless instructed by your physician -Make any legal decisions or sign important papers.  Meals: Start with liquid foods such as gelatin or soup. Progress to regular foods as tolerated. Avoid greasy, spicy, heavy foods. If nausea and/or vomiting occur, drink only clear liquids until the nausea and/or vomiting subsides. Call your physician if vomiting continues.  Special Instructions/Symptoms: Your throat may feel dry or sore from the anesthesia or the breathing tube placed in your throat during surgery. If this causes discomfort, gargle with warm salt water. The discomfort should disappear within 24 hours.  If you had a scopolamine patch placed behind your ear for the management of post- operative nausea and/or vomiting:  1. The medication in the patch is effective for 72 hours, after which it should be removed.  Wrap patch in a tissue and discard in the trash. Wash hands thoroughly with soap and water. 2. You may remove the patch earlier than 72 hours if you experience unpleasant side effects which may include dry mouth, dizziness or visual disturbances. 3. Avoid touching the patch. Wash your hands with soap and water after contact with the patch.     Information for Discharge Teaching: EXPAREL (bupivacaine liposome injectable suspension)   Your surgeon or anesthesiologist gave you EXPAREL(bupivacaine) to help control your pain after surgery.   EXPAREL is a local anesthetic that provides pain relief by numbing the tissue around the surgical site.  EXPAREL is designed to release pain medication over time and can control pain for up to 72  hours.  Depending on how you respond to EXPAREL, you may require less pain medication during your recovery.  Possible side effects:  Temporary loss of sensation or ability to move in the area where bupivacaine was injected.  Nausea, vomiting, constipation  Rarely, numbness and tingling in your mouth or lips, lightheadedness, or anxiety may occur.  Call your doctor right away if you think you may be experiencing any of these sensations, or if you have other questions regarding possible side effects.  Follow all other discharge instructions given to you by your surgeon or nurse. Eat a healthy diet and drink plenty of water or other fluids.  If you return to the hospital for any reason within 96 hours following the administration of EXPAREL, it is important for health care providers to know that you have received this anesthetic. A teal colored band has been placed on your arm with the date, time and amount of EXPAREL you have received in order to alert and inform your health care providers. Please leave this armband in place for the full 96 hours following administration, and then you may remove the band.

## 2018-12-24 NOTE — Anesthesia Postprocedure Evaluation (Signed)
Anesthesia Post Note  Patient: Samantha Cross  Procedure(s) Performed: RIGHT BREAST LUMPECTOMY WITH RADIOACTIVE SEED AND SENTINEL LYMPH NODE BIOPSY (Right Breast)     Patient location during evaluation: PACU Anesthesia Type: General Level of consciousness: awake and alert Pain management: pain level controlled Vital Signs Assessment: post-procedure vital signs reviewed and stable Respiratory status: spontaneous breathing, nonlabored ventilation, respiratory function stable and patient connected to nasal cannula oxygen Cardiovascular status: blood pressure returned to baseline and stable Postop Assessment: no apparent nausea or vomiting Anesthetic complications: no    Last Vitals:  Vitals:   12/24/18 1030 12/24/18 1115  BP: 135/87 (!) 145/84  Pulse: 92 94  Resp: 15 16  Temp: 36.4 C 36.5 C  SpO2: 93% 93%    Last Pain:  Vitals:   12/24/18 1115  TempSrc:   PainSc: 0-No pain                 Montez Hageman

## 2018-12-24 NOTE — H&P (Signed)
Samantha Cross  Location: [facility] Patient #: [id number] DOB: [birth year] Married / Language: English / Race: White Female   History of Present Illness  The patient is a 73 year old female who presents for a follow-up for Breast cancer. The patient is a 73 year old white female who was diagnosed with a 5.3 cm cancer in the upper outer quadrant of the right breast that was ER positive and PR negative and HER-2 negative with a Ki-67 of 10%. She was treated with neoadjuvant hormone therapy and her cancer has shrunk from 5.7 cm by MRI to 3.7 cm. The middle of general marker six-month time point and she will be ready to schedule surgery at that point. She is favoring breast conservation.   Problem List/Past Medical MALIGNANT NEOPLASM OF UPPER-OUTER QUADRANT OF RIGHT BREAST IN FEMALE, ESTROGEN RECEPTOR POSITIVE (C50.411)   Past Surgical History Breast Biopsy  Right. Cesarean Section - 1  Colon Polyp Removal - Colonoscopy  Hysterectomy (not due to cancer) - Complete   Diagnostic Studies History  Mammogram  within last year  Allergies Lisinopril *ANTIHYPERTENSIVES*  Alendronate Sodium *ENDOCRINE AND METABOLIC AGENTS - MISC.*  hydrOXYzine HCl *ANTIANXIETY AGENTS*   Medication History amLODIPine Besylate (10MG Tablet, Oral) Active. Clobetasol Propionate E (0.05% Cream, External) Active. Simvastatin (20MG Tablet, Oral) Active. Red Yeast Rice (Oral) Specific strength unknown - Active. Medications Reconciled  Social History Alcohol use  Occasional alcohol use. Caffeine use  Coffee.  Family History  Breast Cancer  Sister. Cerebrovascular Accident  Father, Mother. Cervical Cancer  Mother. Colon Cancer  Brother, Mother. Colon Polyps  Mother. Heart Disease  Father. Hypertension  Father. Melanoma  Brother. Ovarian Cancer  Mother.  Pregnancy / Birth History Age at menarche  27 years. Gravida  2 Para  2  Other Problems  Breast  Cancer     Review of Systems  General Not Present- Appetite Loss, Chills, Fatigue, Fever, Night Sweats, Weight Gain and Weight Loss. Skin Not Present- Change in Wart/Mole, Dryness, Hives, Jaundice, New Lesions, Non-Healing Wounds, Rash and Ulcer. HEENT Not Present- Earache, Hearing Loss, Hoarseness, Nose Bleed, Oral Ulcers, Ringing in the Ears, Seasonal Allergies, Sinus Pain, Sore Throat, Visual Disturbances, Wears glasses/contact lenses and Yellow Eyes. Cardiovascular Not Present- Chest Pain, Difficulty Breathing Lying Down, Leg Cramps, Palpitations, Rapid Heart Rate, Shortness of Breath and Swelling of Extremities. Gastrointestinal Not Present- Abdominal Pain, Bloating, Bloody Stool, Change in Bowel Habits, Chronic diarrhea, Constipation, Difficulty Swallowing, Excessive gas, Gets full quickly at meals, Hemorrhoids, Indigestion, Nausea, Rectal Pain and Vomiting. Female Genitourinary Not Present- Frequency, Nocturia, Painful Urination, Pelvic Pain and Urgency. Musculoskeletal Not Present- Back Pain, Joint Pain, Joint Stiffness, Muscle Pain, Muscle Weakness and Swelling of Extremities. Neurological Not Present- Decreased Memory, Fainting, Headaches, Numbness, Seizures, Tingling, Tremor, Trouble walking and Weakness. Psychiatric Not Present- Anxiety, Bipolar, Change in Sleep Pattern, Depression, Fearful and Frequent crying. Endocrine Not Present- Cold Intolerance, Excessive Hunger, Hair Changes, Heat Intolerance, Hot flashes and New Diabetes. Hematology Not Present- Blood Thinners, Easy Bruising, Excessive bleeding, Gland problems, HIV and Persistent Infections.  Vitals Weight: 151.25 lb Height: 64in Body Surface Area: 1.74 m Body Mass Index: 25.96 kg/m  Pulse: 64 (Regular)  BP: 120/82(Sitting, Left Arm, Standard)       Physical Exam General Mental Status-Alert. General Appearance-Consistent with stated age. Hydration-Well hydrated. Voice-Normal.  Head and  Neck Head-normocephalic, atraumatic with no lesions or palpable masses. Trachea-midline. Thyroid Gland Characteristics - normal size and consistency.  Eye Eyeball - Bilateral-Extraocular movements intact. Sclera/Conjunctiva - Bilateral-No  scleral icterus.  Chest and Lung Exam Chest and lung exam reveals -quiet, even and easy respiratory effort with no use of accessory muscles and on auscultation, normal breath sounds, no adventitious sounds and normal vocal resonance. Inspection Chest Wall - Normal. Back - normal.  Breast Note: There is a very vague ill-defined fullness to the upper portion of the right breast but no distinct mass can be felt. There is no palpable mass of the left breast. There is no palpable axillary, supraclavicular, or cervical lymphadenopathy.   Cardiovascular Cardiovascular examination reveals -normal heart sounds, regular rate and rhythm with no murmurs and normal pedal pulses bilaterally.  Abdomen Inspection Inspection of the abdomen reveals - No Hernias. Skin - Scar - no surgical scars. Palpation/Percussion Palpation and Percussion of the abdomen reveal - Soft, Non Tender, No Rebound tenderness, No Rigidity (guarding) and No hepatosplenomegaly. Auscultation Auscultation of the abdomen reveals - Bowel sounds normal.  Neurologic Neurologic evaluation reveals -alert and oriented x 3 with no impairment of recent or remote memory. Mental Status-Normal.  Musculoskeletal Normal Exam - Left-Upper Extremity Strength Normal and Lower Extremity Strength Normal. Normal Exam - Right-Upper Extremity Strength Normal and Lower Extremity Strength Normal.  Lymphatic Head & Neck  General Head & Neck Lymphatics: Bilateral - Description - Normal. Axillary  General Axillary Region: Bilateral - Description - Normal. Tenderness - Non Tender. Femoral & Inguinal  Generalized Femoral & Inguinal Lymphatics: Bilateral - Description - Normal. Tenderness  - Non Tender.    Assessment & Plan  MALIGNANT NEOPLASM OF UPPER-OUTER QUADRANT OF RIGHT BREAST IN FEMALE, ESTROGEN RECEPTOR POSITIVE (C50.411) Impression: The patient originally had a 5.3 cm cancer in the upper outer quadrant of the right breast. She was treated with neoadjuvant hormones and has had significant shrinkage of the tumor. Her lymph nodes were normal looking. I have talked her today about the different options for treatment and at this point she favors breast conservation. I think this is a reasonable option for her given the shrinkage of the tumor. She will require a right breast radioactive seed localized lumpectomy and sentinel node biopsy. I have discussed with her in detail the risks and benefits of the operation as well as some of the technical aspects and she understands and wishes to proceed

## 2018-12-24 NOTE — Anesthesia Procedure Notes (Signed)
Procedure Name: LMA Insertion Date/Time: 12/24/2018 8:32 AM Performed by: Signe Colt, CRNA Pre-anesthesia Checklist: Patient identified, Emergency Drugs available, Suction available and Patient being monitored Patient Re-evaluated:Patient Re-evaluated prior to induction Oxygen Delivery Method: Circle system utilized Preoxygenation: Pre-oxygenation with 100% oxygen Induction Type: IV induction Ventilation: Mask ventilation without difficulty LMA: LMA inserted LMA Size: 4.0 Number of attempts: 1 Airway Equipment and Method: Bite block Placement Confirmation: positive ETCO2 Tube secured with: Tape Dental Injury: Teeth and Oropharynx as per pre-operative assessment

## 2018-12-25 ENCOUNTER — Encounter (HOSPITAL_BASED_OUTPATIENT_CLINIC_OR_DEPARTMENT_OTHER): Payer: Self-pay | Admitting: General Surgery

## 2018-12-31 ENCOUNTER — Telehealth: Payer: Self-pay | Admitting: Family Medicine

## 2018-12-31 ENCOUNTER — Other Ambulatory Visit: Payer: Self-pay | Admitting: Family Medicine

## 2018-12-31 MED ORDER — AMLODIPINE BESYLATE 10 MG PO TABS: ORAL_TABLET | ORAL | 1 refills | Status: DC

## 2018-12-31 NOTE — Telephone Encounter (Signed)
Called patient just to check on her with her new diagnosis of breast cancer had not seen her since she had come in for her initial exam and had showed me the little indentation in her breast and we referred her for work-up.  Overall she is actually doing pretty well she is in good spirits and is positive about everything.  She just had the lumpectomy and lymph node biopsy about a week ago and says that she is just a little bit sore but seems to work at recovering pretty quickly.  She has a follow-up in a couple weeks with oncology.

## 2019-01-01 ENCOUNTER — Other Ambulatory Visit: Payer: Self-pay | Admitting: Family Medicine

## 2019-01-01 DIAGNOSIS — R21 Rash and other nonspecific skin eruption: Secondary | ICD-10-CM

## 2019-01-01 NOTE — Progress Notes (Signed)
Patient Care Team: [name], MD as PCP - General (Family Medicine)  DIAGNOSIS:    ICD-10-CM   1. Malignant neoplasm of upper-outer quadrant of right breast in female, estrogen receptor positive ([place])  C50.411    Z17.0     SUMMARY OF ONCOLOGIC HISTORY: Oncology History  Malignant neoplasm of upper-outer quadrant of right breast in female, estrogen receptor positive ([place])  04/23/2018 Initial Diagnosis   Palpable lump in right breast UOQ, mammogram revealed 2 separate masses.  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  The Multi-Gene Panel offered by [facility] includes sequencing and/or deletion duplication testing of the following 85 genes: AIP, ALK, APC, ATM, AXIN2,BAP1,  BARD1, BLM, BMPR1A, BRCA1, BRCA2, BRIP1, CASR, CDC73, CDH1, CDK4, CDKN1B, CDKN1C, CDKN2A (p14ARF), CDKN2A (p16INK4a), CEBPA, CHEK2, CTNNA1, DICER1, DIS3L2, EGFR (c.2369C>T, p.Thr790Met variant only), EPCAM (Deletion/duplication testing only), FH, FLCN, GATA2, GPC3, GREM1 (Promoter region deletion/duplication testing only), HOXB13 (c.251G>A, p.Gly84Glu), HRAS, KIT, MAX, MEN1, MET, MITF (c.952G>A, p.Glu318Lys variant only), MLH1, MSH2, MSH3, MSH6, MUTYH, NBN, NF1, NF2, NTHL1, PALB2, PDGFRA, PHOX2B, PMS2, POLD1, POLE, POT1, PRKAR1A, PTCH1, PTEN, RAD50, RAD51C, RAD51D, RB1, RECQL4, RET, RNF43, RUNX1, SDHAF2, SDHA (sequence changes only), SDHB, SDHC, SDHD, SMAD4, SMARCA4, SMARCB1, SMARCE1, STK11,  SUFU, TERC, TERT, TMEM127, TP53, TSC1, TSC2, VHL, WRN and WT1.      12/24/2018 Surgery   Right lumpectomy [name]): residual invasive carcinoma, clear margins, and 2/3 lymph nodes positive for carcinoma.      CHIEF COMPLIANT: Follow-up s/p lumpectomy to review pathology   INTERVAL HISTORY: Samantha Cross is a 73 y.o. with above-mentioned history of right breast cancer who was on neoadjuvant anti-estrogen therapy with letrozole. She underwent a right lumpectomy with Dr. [name] on 12/24/18 for which pathology confirmed residual invasive carcinoma, clear margins, and 2/3 lymph nodes positive for carcinoma. She presents to the clinic today to discuss the pathology report and further treatment.   REVIEW OF SYSTEMS:   Constitutional: Denies fevers, chills or abnormal weight loss Eyes: Denies blurriness of vision Ears, nose, mouth, throat, and face: Denies mucositis or sore throat Respiratory: Denies cough, dyspnea or wheezes Cardiovascular: Denies palpitation, chest discomfort Gastrointestinal: Denies nausea, heartburn or change in bowel habits Skin: Denies abnormal skin rashes Lymphatics: Denies new lymphadenopathy or easy bruising Neurological: Denies numbness, tingling or new weaknesses Behavioral/Psych: Mood is stable, no new changes  Extremities: No lower extremity edema Breast: denies any pain or lumps or nodules in either breasts All other systems were reviewed with the patient and are negative.  I have reviewed the past medical history, past surgical history, social history and family history with the patient and they are unchanged from previous note.  ALLERGIES:  is allergic to lisinopril; alendronate [alendronate]; and hydroxyzine.  MEDICATIONS:  Current Outpatient Medications  Medication Sig Dispense Refill  . amLODipine (NORVASC) 10 MG tablet TAKE 1 TABLET(10 MG) BY MOUTH DAILY 90 tablet 1  . CLOBETASOL PROPIONATE E 0.05 % emollient cream  APPLY EXTERNALLY TO THE AFFECTED  AREA TWICE DAILY 45 g prn  . letrozole (FEMARA) 2.5 MG tablet Take 1 tablet (2.5 mg total) by mouth daily. 90 tablet 3  . RED YEAST RICE EXTRACT PO Take 1 tablet by mouth daily.    . simvastatin (ZOCOR) 20 MG tablet TAKE 1 TABLET BY MOUTH EVERY NIGHT AT BEDTIME 90 tablet 3   No current facility-administered medications for this visit.     PHYSICAL EXAMINATION: ECOG PERFORMANCE STATUS: 1 - Symptomatic but completely ambulatory  Vitals:   01/02/19 0952  BP: 136/75  Pulse: 66  Resp: 19  Temp: 98.5 F (36.9 C)  SpO2: 100%   Filed Weights   01/02/19 0952  Weight: 149 lb 8 oz (67.8 kg)    GENERAL: alert, no distress and comfortable SKIN: skin color, texture, turgor are normal, no rashes or significant lesions EYES: normal, Conjunctiva are pink and non-injected, sclera clear OROPHARYNX: no exudate, no erythema and lips, buccal mucosa, and tongue normal  NECK: supple, thyroid normal size, non-tender, without nodularity LYMPH: no palpable lymphadenopathy in the cervical, axillary or inguinal LUNGS: clear to auscultation and percussion with normal breathing effort HEART: regular rate & rhythm and no murmurs and no lower extremity edema ABDOMEN: abdomen soft, non-tender and normal bowel sounds MUSCULOSKELETAL: no cyanosis of digits and no clubbing  NEURO: alert & oriented x 3 with fluent speech, no focal motor/sensory deficits EXTREMITIES: No lower extremity edema  LABORATORY DATA:  I have reviewed the data as listed CMP Latest Ref Rng & Units 04/09/2018 10/09/2016 11/08/2015  Glucose 65 - 99 mg/dL 103(H) 95 97  BUN 7 - 25 mg/dL _0 Creatinine 0.50 - 0.99 mg/dL 0.78 0.72 0.69  Sodium 135 - 146 mmol/L 144 143 140  Potassium 3.5 - 5.3 mmol/L 4.1 4.2 4.3  Chloride 98 - 110 mmol/L 107 105 105  CO2 20 - 32 mmol/L _1 Calcium 8.6 - 10.4 mg/dL 9.8 9.8 9.4  Total Protein 6.1 - 8.1 g/dL 7.3 7.8 7.2  Total Bilirubin 0.2 - 1.2 mg/dL 0.9 0.8 0.8  Alkaline Phos 33 - 130 U/L - 80 81   AST 10 - 35 U/L _2 ALT 6 - 29 U/L _3 Lab Results  Component Value Date   WBC 5.8 04/15/2013   HGB 14.3 04/15/2013   HCT 42.0 04/15/2013   MCV 88.4 04/15/2013   PLT 302 04/15/2013   NEUTROABS 2.8 04/15/2013    ASSESSMENT & PLAN:  Malignant neoplasm of upper-outer quadrant of right breast in female, estrogen receptor positive ([place]) 04/23/2018:Palpable lump in right breast UOQ, mammogram revealed 2 separate masses. 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 2.Neoadjuvant antiestrogen therapy 05/12/2018-July 2020 3.Followed by breast conserving surgery with sentinel lymph node surgery 12/24/2018 4.Adjuvant radiation therapy 5.Followed by adjuvant antiestrogen therapy ----------------------------------------------------------------------------------------------------------------------------------------------- Current treatment: Neoadjuvant letrozole started 05/12/2018 Letrozole toxicities:Occasional hot flashes and occasional arthralgias.   Mammogram and ultrasound 08/11/18: Interval decrease in the size of the  mass from 5.3 cm to 3.8 cm Right lumpectomy: Residual invasive ductal carcinoma 3.8 cm, margins negative, 2/3 lymph nodes positive, ER 95%, PR 0%, HER-2 negative, Ki-67 10%  T2N1A stage Ib  Pathology counseling: I discussed the final pathology report of the patient provided  a copy of this report. I discussed the margins as well as lymph node surgeries. We also discussed the  final staging along with previously performed ER/PR and HER-2/neu testing.  Recommendation: Axillary lymph node dissection followed by adjuvant radiation followed by adjuvant antiestrogen therapy. MammaPrint testing to determine if she would need chemotherapy.  We will do a [facility] virtual visit after I receive the MammaPrint test results. We will be presenting her case in the tumor board to discuss the role of axillary lymph node dissection.  No orders of the defined types were placed in this encounter.  The patient has a good understanding of the overall plan. she agrees with it. she will call with any problems that may develop before the next visit here.  [name], MD 01/02/2019  Julious Oka Dorshimer am acting as scribe for Dr. [name].  I have reviewed the above documentation for accuracy and completeness, and I agree with the above.

## 2019-01-02 ENCOUNTER — Inpatient Hospital Stay: Payer: Medicare Other | Attending: Hematology and Oncology | Admitting: Hematology and Oncology

## 2019-01-02 ENCOUNTER — Other Ambulatory Visit: Payer: Self-pay

## 2019-01-02 ENCOUNTER — Telehealth: Payer: Self-pay | Admitting: *Deleted

## 2019-01-02 DIAGNOSIS — Z79811 Long term (current) use of aromatase inhibitors: Secondary | ICD-10-CM

## 2019-01-02 DIAGNOSIS — C50411 Malignant neoplasm of upper-outer quadrant of right female breast: Secondary | ICD-10-CM

## 2019-01-02 DIAGNOSIS — Z170 Estrogen receptor positive status [ER+]: Secondary | ICD-10-CM | POA: Diagnosis not present

## 2019-01-02 NOTE — Telephone Encounter (Signed)
Ordered mammaprint per Dr. Gudena.  Faxed requisition to pathology and [facility] 

## 2019-01-02 NOTE — Assessment & Plan Note (Signed)
04/23/2018:Palpable lump in right breast UOQ, mammogram revealed 2 separate masses. 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 2.Neoadjuvant antiestrogen therapy 05/12/2018-July 2020 3.Followed by breast conserving surgery versus mastectomy depending on response 12/24/2018 4.Adjuvant radiation therapy 5.Followed by adjuvant antiestrogen therapy ----------------------------------------------------------------------------------------------------------------------------------------------- Current treatment: Neoadjuvant letrozole started 05/12/2018 Letrozole toxicities:Occasional hot flashes and occasional arthralgias. She has difficulty sleeping and instructed to take the medication at bedtime.  Mammogram and ultrasound 08/11/18: Interval decrease in the size of the mass from 5.3 cm to 3.8 cm Right lumpectomy: Residual invasive ductal carcinoma 3.8 cm, margins negative, 2/3 lymph nodes positive, ER 95%, PR 0%, HER-2 negative, Ki-67 10%  T2N1A stage Ib  Pathology counseling: I discussed the final pathology report of the patient provided  a copy of this report. I discussed the margins as well as lymph node surgeries. We also discussed the final staging along with previously performed ER/PR and HER-2/neu testing.  Recommendation: Axillary lymph node dissection followed by adjuvant radiation followed by adjuvant antiestrogen therapy.

## 2019-01-05 NOTE — Progress Notes (Signed)
Location of Breast Cancer: Right Breast  Histology per Pathology Report:  04/23/18 Diagnosis 1. Breast, right, needle core biopsy, UOQ; 12 o'clock 3cm from nipple - INVASIVE MAMMARY CARCINOMA, GRADE II-III. SEE NOTE. - MAMMARY CARCINOMA IN SITU, INTERMEDIATE NUCLEAR GRADE 2. Breast, right, needle core biopsy, 10 o'clock 6 cm from nipple - INVASIVE MAMMARY CARCINOMA, GRADE II-III. SEE NOTE. - MAMMARY CARCINOMA IN SITU, INTERMEDIATE NUCLEAR GRADE  Receptor Status: ER(95%), PR (NEG), Her2-neu (NEG), Ki-(10%)  12/24/18 Diagnosis 1. Lymph node, sentinel, biopsy, right #1 - METASTATIC CARCINOMA IN ONE LYMPH NODE (1/1). 2. Lymph node, sentinel, biopsy, right - METASTATIC CARCINOMA IN ONE LYMPH NODE (1/1). 3. Lymph node, sentinel, biopsy, right #2 - ONE LYMPH NODE, NEGATIVE FOR CARCINOMA (0/1). 4. Breast, lumpectomy, right with 2 radioactive seeds - RESIDUAL INVASIVE CARCINOMA STATUS POST NEOADJUVANT TREATMENT. - MARGINS OF RESECTION ARE NOT INVOLVED (CLOSEST MARGINS: LESS THAN 1 MM; ANTERIOR, POSTERIOR, SUPERIOR, INFERIOR AND MEDIAL). - BIOPSY SITE CHANGES. - SEE ONCOLOGY TABLE  Did patient present with symptoms or was this found on screening mammography?: She first felt the mass about a year ago in her right breast. It gradually increased in size and she presented to her physician.   Past/Anticipated interventions by surgeon, if any: 12/24/18 PROCEDURE:  Procedure(s): RIGHT BREAST LUMPECTOMY WITH RADIOACTIVE SEED LOCALIZATION AND DEEP RIGHT AXILLARY SENTINEL LYMPH NODE BIOPSY (Right) SURGEON:  Surgeon(s) and Role:    Jovita Kussmaul, MD - Primary   Past/Anticipated interventions by medical oncology, if any: 01/02/19 Dr. Lindi Adie Treatment plan: 1.Oncotype testing: 24 no benefit from chemotherapy. 2.Neoadjuvant antiestrogen therapy 05/12/2018-July 2020 3.Followed by breast conserving surgery versus mastectomy depending on response 12/24/2018 4.Adjuvant radiation  therapy 5.Followed by adjuvant antiestrogen therapy ----------------------------------------------------------------------------------------------------------------------------------------------- Current treatment: Neoadjuvant letrozole started 05/12/2018 Letrozole toxicities:Occasional hot flashes and occasional arthralgias. She has difficulty sleeping and instructed to take the medication at bedtime  Pathology counseling: I discussed the final pathology report of the patient provided  a copy of this report. I discussed the margins as well as lymph node surgeries. We also discussed the final staging along with previously performed ER/PR and HER-2/neu testing.  Recommendation: Axillary lymph node dissection followed by adjuvant radiation followed by adjuvant antiestrogen therapy.   Lymphedema issues, if any:  She has minor swelling. She has good arm mobility  Pain issues, if any:  She denies pain. She does have sensitivity to her surgery site.   SAFETY ISSUES:  Prior radiation? No  Pacemaker/ICD? No  Possible current pregnancy? No  Is the patient on methotrexate? No  Current Complaints / other details:

## 2019-01-08 ENCOUNTER — Telehealth: Payer: Self-pay | Admitting: Radiation Oncology

## 2019-01-08 NOTE — Telephone Encounter (Signed)
New message:    LVM for patient to return call to help set up for a virtual visit for appt on 01/13/19

## 2019-01-08 NOTE — Progress Notes (Signed)
Radiation Oncology         [phone]) [phone] ________________________________  Name: Samantha Cross MRN: [id number]  Date: 01/13/2019  DOB: July 07, 1945  Follow-Up New Visit Note by WebEx due to pandemic precautions Outpatient  CC: Samantha Marry, MD  [name], MD  Diagnosis:      ICD-10-CM   1. Malignant neoplasm of upper-outer quadrant of right breast in female, estrogen receptor positive ([place])  C50.411    Z17.0      Cancer Staging Malignant neoplasm of upper-outer quadrant of right breast in female, estrogen receptor positive ([place]) Staging form: Breast, AJCC 8th Edition - Clinical: Stage IIIA (cT3(m), cN0, cM0, G3, ER+, PR-, HER2-) - Signed by [name], MD on 05/12/2018 - Pathologic stage from 01/14/2019: No Stage Recommended (ypT2, pN1a, cM0, G2, ER+, PR-, HER2-) - Signed by [name], MD on 01/14/2019     - Pathologic Stage IB (ypT2, ypN1a) Right Breast UOQ Invasive Lobular Carcinoma, ER(+) / PR(-) / Her2(-)  CHIEF COMPLAINT: Here to discuss management of right breast cancer  Narrative:  The patient and her husband met with me by WebEx today.    Systemic therapy involved (dates and therapy as follows): 05/12/2018 - July 2020: Anti-estrogen oral therapy: Neoadjuvant therapy with letrozole 2.5 mg daily.   Since consultation date, she underwent the following imaging (dates and results as follows): Bilateral Breast MRI (05/19/2018): 5.7 cm mass in the central portion of the RIGHT breast consistent with known malignancy. Biopsies were performed of the MEDIAL and LATERAL aspects of this solitary lesion. LEFT breast is negative.    CT Chest, Abdomen, and Pelvis (05/21/2018): No evidence of metastatic lymphadenopathy. No pulmonary metastasis. No evidence of soft tissue metastasis or nodal metastasis in the abdomen or pelvis. No skeletal metastasis. Indeterminate exophytic RIGHT renal lesion with differential including hyperdense cyst versus enhancing solid renal  neoplasm; recommend non emergent renal protocol contrast MRI.  Bone scan (05/21/2018): Degenerative type uptake especially at LEFT hip joint. No scintigraphic evidence of osseous metastatic disease.  MRI Abdomen (06/06/2018): Complex cystic lesion of the right mid kidney has indeterminate enhancement. Most of the post-contrast series show no enhancement but 1 series shows questionable faint enhancement. The small size of this lesion relative to the slice thickness means that the lesion is subject to slice selection artifact phenomenon which could be causing this questionable faint enhancement. Recommend treating this as a Bosniak category 65F cystic lesion (probably benign, but requiring follow up). Follow up renal protocol CT or MRI in 6 months time is recommended.  Diagnostic Right Mammogram and Ultrasound Right Breast (08/11/2018): Interval decrease in size of the known malignancy in the 10:00-12:00 position of the right breast, from 6.8 x 4.4 x 4.3 cm in November 2019 to 6.4 x 4.5 x 4.3 cm in March 2020. Physical examination of the right axilla demonstrated no palpable lymph nodes. Ultrasound of the right axilla demonstrated normal appearing right axillary lymph nodes.  Bilateral Breast MRI (08/28/2018): Positive response to neoadjuvant therapy of the right breast. Abnormal enhancement at site of the biopsy-proven malignancy in the 10:00-12:00 region of the right breast has decreased in overall intensity and now measures 2.8 x 2.5 x 3.7 cm. Abnormal enhancement does involve the upper inner and upper outer quadrants. The left breast is negative.  Right breast lumpectomy with sentinel lymph node biopsy on date of 12/24/2018 revealed: Residual invasive lobular carcinoma, measuring 3.8 cm, status post neoadjuvant treatment. Margins of resection were not involved (closest margins: less than 1 mm;  anterior, posterior, superior, inferior, and medial). Metastatic carcinoma in 2/3 right lymph nodes.  ER status: 100%,  PR status: negative, Her2 status: negative.  Symptomatically, the patient reports: No pain.  She does have some sensitivity to her surgical site.  She has good arm range of motion and minor swelling at the surgical site.        ALLERGIES:  is allergic to lisinopril; alendronate [alendronate]; and hydroxyzine.  Meds: Current Outpatient Medications  Medication Sig Dispense Refill   amLODipine (NORVASC) 10 MG tablet TAKE 1 TABLET(10 MG) BY MOUTH DAILY 90 tablet 1   CLOBETASOL PROPIONATE E 0.05 % emollient cream APPLY EXTERNALLY TO THE AFFECTED AREA TWICE DAILY 45 g prn   letrozole (FEMARA) 2.5 MG tablet Take 1 tablet (2.5 mg total) by mouth daily. 90 tablet 3   simvastatin (ZOCOR) 20 MG tablet TAKE 1 TABLET BY MOUTH EVERY NIGHT AT BEDTIME 90 tablet 3   RED YEAST RICE EXTRACT PO Take 1 tablet by mouth daily.     No current facility-administered medications for this encounter.     Physical Findings:  vitals were not taken for this visit. .     General: Alert and oriented, in no acute distress  Psychiatric: Judgment and insight are intact. Affect is appropriate.    Lab Findings: Lab Results  Component Value Date   WBC 5.8 04/15/2013   HGB 14.3 04/15/2013   HCT 42.0 04/15/2013   MCV 88.4 04/15/2013   PLT 302 04/15/2013      Radiographic Findings: Nm Sentinel Node Inj-no Rpt (breast)  Result Date: 12/24/2018 Sulfur colloid was injected by the nuclear medicine technologist for melanoma sentinel node.   Mm Breast Surgical Specimen  Result Date: 12/24/2018 CLINICAL DATA:  Status post surgical excision of biopsy proven EXAM: SPECIMEN RADIOGRAPH OF THE RIGHT BREAST COMPARISON:  Previous exam(s). FINDINGS: Status post excision of the right breast. The radioactive seeds and biopsy marker clips are present, completely intact, and were marked for pathology. IMPRESSION: Specimen radiograph of the right breast. Electronically Signed   By: Lillia Mountain M.D.   On: 12/24/2018 09:43   Mm Rt  Radioactive Seed Loc Mammo Guide  Result Date: 12/23/2018 CLINICAL DATA:  Patient presents for seed localization prior to lumpectomy for RIGHT breast cancer. EXAM: MAMMOGRAPHIC GUIDED RADIOACTIVE SEED LOCALIZATION OF THE RIGHT BREAST x2 COMPARISON:  Previous exam(s). FINDINGS: Patient presents for radioactive seed localization prior to lumpectomy. I met with the patient and we discussed the procedure of seed localization including benefits and alternatives. We discussed the high likelihood of a successful procedure. We discussed the risks of the procedure including infection, bleeding, tissue injury and further surgery. We discussed the low dose of radioactivity involved in the procedure. Informed, written consent was given. The usual time-out protocol was performed immediately prior to the procedure. Using mammographic guidance, sterile technique, 1% lidocaine and an I-125 radioactive seed, the ribbon shaped clip in the LATERAL portion of the RIGHT breast was localized using a LATERAL to MEDIAL approach. Order number of I-125 seed:  [id number]. Total activity:  7.948 millicuries reference Date: 11/18/2018 Using mammographic guidance, sterile technique, 1% lidocaine and I-125 radioactive seed, the coil shaped clip in the LATERAL portion of the RIGHT breast was localized using a LATERAL to MEDIAL approach. Order number of I-125 seed: [id number] Total activity: 2.707 millicuries.  Reference date: 11/26/2018. The follow-up mammogram images confirm the seeds in the expected location and were marked for Dr. [name]. Follow-up survey of the patient confirms presence of  the radioactive seed. The patient tolerated the procedure well and was released from the [facility]. She was given instructions regarding seed removal. IMPRESSION: Radioactive seed localization right breast. No apparent complications. Electronically Signed   By: Nolon Nations M.D.   On: 12/23/2018 15:15   Mm Rt Radio Seed Ea Add Lesion Loc  Mammo  Result Date: 12/23/2018 CLINICAL DATA:  Patient presents for seed localization prior to lumpectomy for RIGHT breast cancer. EXAM: MAMMOGRAPHIC GUIDED RADIOACTIVE SEED LOCALIZATION OF THE RIGHT BREAST x2 COMPARISON:  Previous exam(s). FINDINGS: Patient presents for radioactive seed localization prior to lumpectomy. I met with the patient and we discussed the procedure of seed localization including benefits and alternatives. We discussed the high likelihood of a successful procedure. We discussed the risks of the procedure including infection, bleeding, tissue injury and further surgery. We discussed the low dose of radioactivity involved in the procedure. Informed, written consent was given. The usual time-out protocol was performed immediately prior to the procedure. Using mammographic guidance, sterile technique, 1% lidocaine and an I-125 radioactive seed, the ribbon shaped clip in the LATERAL portion of the RIGHT breast was localized using a LATERAL to MEDIAL approach. Order number of I-125 seed:  [id number]. Total activity:  4.098 millicuries reference Date: 11/18/2018 Using mammographic guidance, sterile technique, 1% lidocaine and I-125 radioactive seed, the coil shaped clip in the LATERAL portion of the RIGHT breast was localized using a LATERAL to MEDIAL approach. Order number of I-125 seed: [id number] Total activity: 5.621 millicuries.  Reference date: 11/26/2018. The follow-up mammogram images confirm the seeds in the expected location and were marked for Dr. [name]. Follow-up survey of the patient confirms presence of the radioactive seed. The patient tolerated the procedure well and was released from the [facility]. She was given instructions regarding seed removal. IMPRESSION: Radioactive seed localization right breast. No apparent complications. Electronically Signed   By: Nolon Nations M.D.   On: 12/23/2018 15:15    Impression/Plan: Right Breast Cancer  I had a lengthy conversation  with Dr. Lindi Adie and Dr. [name] about this patient.  Dr. [name] is concerned that she has gross disease or at least microscopic disease remaining at the lumpectomy site based on the multiple extremely close margins.  The tumor was significantly smaller than anticipated in the pathology report.  We also discussed the 2/3 positive lymph nodes that were found on the sentinel lymph node biopsy.  We agree as a team that radiotherapy is likely to prevent any recurrences in the supraclavicular and axillary regions and that her prognosis would probably not be significantly improved with a full axillary dissection.  At the very least she does need more surgery to the primary site Dr. [name] favors that she would best be treated by mastectomy +/- ALND.  Another issue is that MammaPrint results are pending to determine if she will need chemotherapy given the new awareness of her lymph node positivity.  I discussed all the above issues with the patient.  She was a little bit taken aback finding out that she may need a mastectomy but she looks forward to talking to Dr. [name] about this further.  His team will get in touch with her regarding an appointment and she will hear from medical oncology when her mamma print results are back.  Ultimately she will need adjuvant radiotherapy.  First we need need to sort out further surgical and systemic therapy options.  We discussed adjuvant radiotherapy today.  I recommend radiotherapy to the right breast or  chest wall and regional nodes in order to reduce risk of local regional recurrence by two thirds.  The risks, benefits and side effects of this treatment were discussed in detail.  She understands that radiotherapy is associated with skin irritation and fatigue in the acute setting. Late effects can include cosmetic changes and rare injury to internal organs.   She is enthusiastic about proceeding with treatment.  A total of 5 medically necessary complex treatment devices will be  fabricated and supervised by me: 4 fields with MLCs for custom blocks to protect heart, and lungs;  and, a Vac-lok. MORE COMPLEX DEVICES MAY BE MADE IN DOSIMETRY FOR FIELD IN FIELD BEAMS FOR DOSE HOMOGENEITY.  I have requested : 3D Simulation which is medically necessary to give adequate dose to at risk tissues while sparing lungs and heart.  I have requested a DVH of the following structures: lungs, heart, esophagus.  The patient will receive 50-50.4 Gy in 25-28 fractions to the right chest wall or breast and regional nodes with 4 fields.  This will be  followed by a boost.  This encounter was provided by telemedicine platform Webex.  The patient has given verbal consent for this type of encounter and has been advised to only accept a meeting of this type in a secure network environment. The time spent during this encounter was 50  Minutes. The attendants for this meeting include [name]  and [facility].  During the encounter, [name] was located at [facility] Radiation Oncology Department.  Melodi Happel [place] was located at home.  _____________________________________   [name], MD  This document serves as a record of services personally performed by [name], MD. It was created on her behalf by Rae Lips, a trained medical scribe. The creation of this record is based on the scribe's personal observations and the provider's statements to them. This document has been checked and approved by the attending provider.

## 2019-01-13 ENCOUNTER — Encounter: Payer: Self-pay | Admitting: Radiation Oncology

## 2019-01-13 ENCOUNTER — Ambulatory Visit
Admission: RE | Admit: 2019-01-13 | Discharge: 2019-01-13 | Disposition: A | Discharge status: Home / Self Care | Payer: Medicare Other | Source: Ambulatory Visit | Attending: Radiation Oncology | Admitting: Radiation Oncology

## 2019-01-13 ENCOUNTER — Other Ambulatory Visit: Payer: Self-pay

## 2019-01-13 DIAGNOSIS — C50411 Malignant neoplasm of upper-outer quadrant of right female breast: Secondary | ICD-10-CM | POA: Diagnosis not present

## 2019-01-13 DIAGNOSIS — Z170 Estrogen receptor positive status [ER+]: Secondary | ICD-10-CM

## 2019-01-13 DIAGNOSIS — C773 Secondary and unspecified malignant neoplasm of axilla and upper limb lymph nodes: Secondary | ICD-10-CM | POA: Diagnosis not present

## 2019-01-13 DIAGNOSIS — Z79811 Long term (current) use of aromatase inhibitors: Secondary | ICD-10-CM | POA: Diagnosis not present

## 2019-01-13 DIAGNOSIS — Z9889 Other specified postprocedural states: Secondary | ICD-10-CM | POA: Diagnosis not present

## 2019-01-14 ENCOUNTER — Encounter: Payer: Self-pay | Admitting: Radiation Oncology

## 2019-01-16 ENCOUNTER — Telehealth: Payer: Self-pay | Admitting: *Deleted

## 2019-01-16 NOTE — Telephone Encounter (Signed)
Received call back from patient. Confirmed virtual visit for 8/18 at 8:15am.

## 2019-01-16 NOTE — Telephone Encounter (Signed)
Received mammaprint results of high risk.  Left message for a return phone call.

## 2019-01-19 NOTE — Progress Notes (Signed)
HEMATOLOGY-ONCOLOGY DOXIMITY VISIT PROGRESS NOTE  I connected with Samantha Cross on 01/20/2019 at  8:15 AM EDT by Doximity video conference and verified that I am speaking with the correct person using two identifiers.  I discussed the limitations, risks, security and privacy concerns of performing an evaluation and management service by Doximity and the availability of in person appointments.  I also discussed with the patient that there may be a patient responsible charge related to this service. The patient expressed understanding and agreed to proceed.  Patient's Location: Home Physician Location: Clinic  CHIEF COMPLIANT: Follow-up to discuss Mammaprint results   INTERVAL HISTORY: Samantha Cross is a 73 y.o. female with above-mentioned history of right breast cancer treated with neoadjuvant letrozole followed by lumpectomy. Mammaprint testing showed she was high risk for recurrence without systemic treatment. She presents over Doximity today for follow-up.  She is healing and recovering very well from the surgery.  She does not take any further narcotics for pain.  Oncology History  Malignant neoplasm of upper-outer quadrant of right breast in female, estrogen receptor positive ([place])  04/23/2018 Initial Diagnosis   Palpable lump in right breast UOQ, mammogram revealed 2 separate masses.  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  The  Multi-Gene Panel offered by [facility] includes sequencing and/or deletion duplication testing of the following 85 genes: AIP, ALK, APC, ATM, AXIN2,BAP1,  BARD1, BLM, BMPR1A, BRCA1, BRCA2, BRIP1, CASR, CDC73, CDH1, CDK4, CDKN1B, CDKN1C, CDKN2A (p14ARF), CDKN2A (p16INK4a), CEBPA, CHEK2, CTNNA1, DICER1, DIS3L2, EGFR (c.2369C>T, p.Thr790Met variant only), EPCAM (Deletion/duplication testing only), FH, FLCN, GATA2, GPC3, GREM1 (Promoter region deletion/duplication testing only), HOXB13 (c.251G>A, p.Gly84Glu), HRAS, KIT, MAX, MEN1, MET, MITF (c.952G>A, p.Glu318Lys variant only), MLH1, MSH2, MSH3, MSH6, MUTYH, NBN, NF1, NF2, NTHL1, PALB2, PDGFRA, PHOX2B, PMS2, POLD1, POLE, POT1, PRKAR1A, PTCH1, PTEN, RAD50, RAD51C, RAD51D, RB1, RECQL4, RET, RNF43, RUNX1, SDHAF2, SDHA (sequence changes only), SDHB, SDHC, SDHD, SMAD4, SMARCA4, SMARCB1, SMARCE1, STK11, SUFU, TERC, TERT, TMEM127, TP53, TSC1, TSC2, VHL, WRN and WT1.      12/24/2018 Surgery   Right lumpectomy [name]): residual invasive carcinoma, clear margins, and 2/3 lymph nodes positive for carcinoma.    01/14/2019 Cancer Staging   Staging form: Breast, AJCC 8th Edition - Pathologic stage from 01/14/2019: No Stage Recommended (ypT2, pN1a, cM0, G2, ER+, PR-, HER2-) - Signed by [name], MD on 01/14/2019     REVIEW OF SYSTEMS:   Constitutional: Denies fevers, chills or abnormal weight loss Eyes: Denies blurriness of vision Ears, nose, mouth, throat, and face: Denies mucositis or sore throat Respiratory: Denies cough, dyspnea or wheezes Cardiovascular: Denies palpitation, chest discomfort Gastrointestinal:  Denies nausea, heartburn or change in bowel habits Skin: Denies abnormal skin rashes Lymphatics: Denies new lymphadenopathy or easy bruising Neurological:Denies numbness, tingling or new weaknesses Behavioral/Psych: Mood is stable, no new changes  Extremities: No lower extremity edema Breast: denies any pain or lumps or nodules in either breasts All  other systems were  reviewed with the patient and are negative.  Observations/Objective:  There were no vitals filed for this visit. There is no height or weight on file to calculate BMI.  I have reviewed the data as listed CMP Latest Ref Rng & Units 04/09/2018 10/09/2016 11/08/2015  Glucose 65 - 99 mg/dL 103(H) 95 97  BUN 7 - 25 mg/dL '13 12 11  ' Creatinine 0.50 - 0.99 mg/dL 0.78 0.72 0.69  Sodium 135 - 146 mmol/L 144 143 140  Potassium 3.5 - 5.3 mmol/L 4.1 4.2 4.3  Chloride 98 - 110 mmol/L 107 105 105  CO2 20 - 32 mmol/L '28 22 25  ' Calcium 8.6 - 10.4 mg/dL 9.8 9.8 9.4  Total Protein 6.1 - 8.1 g/dL 7.3 7.8 7.2  Total Bilirubin 0.2 - 1.2 mg/dL 0.9 0.8 0.8  Alkaline Phos 33 - 130 U/L - 80 81  AST 10 - 35 U/L '14 17 17  ' ALT 6 - 29 U/L '15 15 16    ' Lab Results  Component Value Date   WBC 5.8 04/15/2013   HGB 14.3 04/15/2013   HCT 42.0 04/15/2013   MCV 88.4 04/15/2013   PLT 302 04/15/2013   NEUTROABS 2.8 04/15/2013      Assessment Plan:  Malignant neoplasm of upper-outer quadrant of right breast in female, estrogen receptor positive ([place]) 04/23/2018:Palpable lump in right breast UOQ, mammogram revealed 2 separate masses. 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 2.Neoadjuvant antiestrogen therapy 05/12/2018-July 2020 3.Followed by breast conserving surgery with sentinel lymph node surgery 12/24/2018 4.Adjuvant radiation therapy 5.Followed by adjuvant antiestrogen therapy  ----------------------------------------------------------------------------------------------------------------------------------------------- Right lumpectomy: Residual invasive ductal carcinoma 3.8 cm, margins negative, 2/3 lymph nodes positive, ER 95%, PR 0%, HER-2 negative, Ki-67 10%  T2N1A stage Ib  Tumor board discussion regarding the treatment plan: We had lengthy discussion about the role of axillary lymph node dissection as well as the role of additional breast surgery for margins.  It was felt that possibly additional surgery on the breast may be required.  Dr. [name] felt that additional surgery would indicate need for mastectomy.  There was extensive discussion about extruding from dissection and we felt that it was not necessary because she would undergo adjuvant radiation.  MammaPrint result: High risk: I discussed with the patient that the molecular testing indicates that she would need systemic chemotherapy.  I explained her the difference between the MammaPrint test result and the Oncotype test result. MammaPrint also indicates that the molecular subtype is HER-2 type even though her HER-2 had been negative previously.  I will need to discuss this with Dr. Glynn Octave from [place].  Potential treatment plan: Dose dense Adriamycin and Cytoxan x4 followed by Taxol weekly x12 Herceptin Patient will come back on 01/22/2019 to discuss the plan. I will request our pathology and [place] regarding the HER-2 result. Patient understands that she will not need axillary lymph node dissection but she will need mastectomy for clearance of the margins.  I discussed the assessment and treatment plan with the patient. The patient was provided an  opportunity to ask questions and all were answered. The patient agreed with the plan and demonstrated an understanding of the instructions. The patient was advised to call back or seek an in-person evaluation if the symptoms worsen or if the condition fails to  improve as anticipated.   I provided 15 minutes of face-to-face Doximity time during this encounter.    Rulon Eisenmenger, MD 01/20/2019   I, Molly Dorshimer, am acting as scribe for [name], MD.  I have reviewed the above documentation for accuracy and completeness, and I agree with the above.

## 2019-01-20 ENCOUNTER — Encounter: Payer: Self-pay | Admitting: *Deleted

## 2019-01-20 ENCOUNTER — Inpatient Hospital Stay: Payer: Medicare Other | Attending: Hematology and Oncology | Admitting: Hematology and Oncology

## 2019-01-20 DIAGNOSIS — C50411 Malignant neoplasm of upper-outer quadrant of right female breast: Secondary | ICD-10-CM | POA: Insufficient documentation

## 2019-01-20 DIAGNOSIS — Z923 Personal history of irradiation: Secondary | ICD-10-CM | POA: Insufficient documentation

## 2019-01-20 DIAGNOSIS — Z170 Estrogen receptor positive status [ER+]: Secondary | ICD-10-CM | POA: Diagnosis not present

## 2019-01-20 DIAGNOSIS — Z79811 Long term (current) use of aromatase inhibitors: Secondary | ICD-10-CM | POA: Insufficient documentation

## 2019-01-20 DIAGNOSIS — Z79899 Other long term (current) drug therapy: Secondary | ICD-10-CM | POA: Insufficient documentation

## 2019-01-20 NOTE — Assessment & Plan Note (Signed)
04/23/2018:Palpable lump in right breast UOQ, mammogram revealed 2 separate masses. 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 2.Neoadjuvant antiestrogen therapy 05/12/2018-July 2020 3.Followed by breast conserving surgery with sentinel lymph node surgery 12/24/2018 4.Adjuvant radiation therapy 5.Followed by adjuvant antiestrogen therapy ----------------------------------------------------------------------------------------------------------------------------------------------- Right lumpectomy: Residual invasive ductal carcinoma 3.8 cm, margins negative, 2/3 lymph nodes positive, ER 95%, PR 0%, HER-2 negative, Ki-67 10%  T2N1A stage Ib  Tumor board discussion regarding the treatment plan: We had lengthy discussion about the role of axillary lymph node dissection as well as the role of additional breast surgery for margins.  It was felt that possibly additional surgery on the breast may be required.  Dr. [name] felt that additional surgery would indicate need for mastectomy.  There was extensive discussion about extruding from dissection and we felt that it was not necessary because she would undergo adjuvant radiation.  MammaPrint result: High risk: I discussed with the patient that the molecular testing indicates that she would need systemic chemotherapy.  I explained her the difference between the MammaPrint test result and the Oncotype test result. MammaPrint also indicates that the molecular subtype is  HER-2 type even though her HER-2 had been negative previously.  I will need to discuss this with Dr. Glynn Octave from [place].

## 2019-01-21 ENCOUNTER — Telehealth: Payer: Self-pay | Admitting: Hematology and Oncology

## 2019-01-21 ENCOUNTER — Telehealth: Payer: Self-pay | Admitting: *Deleted

## 2019-01-21 NOTE — Telephone Encounter (Signed)
I talk with patient regarding schedule for 8/20

## 2019-01-21 NOTE — Telephone Encounter (Signed)
Received order for mammaprint on CORE bx. Requisition faxed to [facility] and [place].

## 2019-01-21 NOTE — Progress Notes (Signed)
Patient Care Team: [name], MD as PCP - General (Family Medicine)  DIAGNOSIS:    ICD-10-CM   1. Malignant neoplasm of upper-outer quadrant of right breast in female, estrogen receptor positive ([place])  C50.411    Z17.0     SUMMARY OF ONCOLOGIC HISTORY: Oncology History  Malignant neoplasm of upper-outer quadrant of right breast in female, estrogen receptor positive ([place])  04/23/2018 Initial Diagnosis   Palpable lump in right breast UOQ, mammogram revealed 2 separate masses.  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  The Multi-Gene Panel offered by [facility] includes sequencing and/or deletion duplication testing of the following 85 genes: AIP, ALK, APC, ATM, AXIN2,BAP1,  BARD1, BLM, BMPR1A, BRCA1, BRCA2, BRIP1, CASR, CDC73, CDH1, CDK4, CDKN1B, CDKN1C, CDKN2A (p14ARF), CDKN2A (p16INK4a), CEBPA, CHEK2, CTNNA1, DICER1, DIS3L2, EGFR (c.2369C>T, p.Thr790Met variant only), EPCAM (Deletion/duplication testing only), FH, FLCN, GATA2, GPC3, GREM1 (Promoter region deletion/duplication testing only), HOXB13 (c.251G>A, p.Gly84Glu), HRAS, KIT, MAX, MEN1, MET, MITF (c.952G>A, p.Glu318Lys variant only), MLH1, MSH2, MSH3, MSH6, MUTYH, NBN, NF1, NF2, NTHL1, PALB2, PDGFRA, PHOX2B, PMS2, POLD1, POLE, POT1, PRKAR1A, PTCH1, PTEN, RAD50, RAD51C, RAD51D, RB1, RECQL4, RET, RNF43, RUNX1, SDHAF2, SDHA (sequence changes only), SDHB, SDHC, SDHD, SMAD4, SMARCA4, SMARCB1, SMARCE1, STK11,  SUFU, TERC, TERT, TMEM127, TP53, TSC1, TSC2, VHL, WRN and WT1.      12/24/2018 Surgery   Right lumpectomy [name]): residual invasive carcinoma, clear margins, and 2/3 lymph nodes positive for carcinoma.    01/14/2019 Cancer Staging   Staging form: Breast, AJCC 8th Edition - Pathologic stage from 01/14/2019: No Stage Recommended (ypT2, pN1a, cM0, G2, ER+, PR-, HER2-) - Signed by [name], MD on 01/14/2019     CHIEF COMPLIANT: Follow-up to discuss treatment plan  INTERVAL HISTORY: Reida Hem is a 73 y.o. with above-mentioned history of right breast cancer treated with neoadjuvant letrozole followed by lumpectomy. Mammaprint testing showed she was high risk for recurrence without systemic treatment. She presents to the clinic today to further discuss her treatment plan.   REVIEW OF SYSTEMS:   Constitutional: Denies fevers, chills or abnormal weight loss Eyes: Denies blurriness of vision Ears, nose, mouth, throat, and face: Denies mucositis or sore throat Respiratory: Denies cough, dyspnea or wheezes Cardiovascular: Denies palpitation, chest discomfort Gastrointestinal: Denies nausea, heartburn or change in bowel habits Skin: Denies abnormal skin rashes Lymphatics: Denies new lymphadenopathy or easy bruising Neurological: Denies numbness, tingling or new weaknesses Behavioral/Psych: Mood is stable, no new changes  Extremities: No lower extremity edema Breast: denies any pain or lumps or nodules in either breasts All other systems were reviewed with the patient and are negative.  I have reviewed the past medical history, past surgical history, social history and family history with the patient and they are unchanged from previous note.  ALLERGIES:  is allergic to lisinopril; alendronate [alendronate]; and hydroxyzine.  MEDICATIONS:  Current Outpatient Medications  Medication Sig Dispense Refill  . amLODipine (NORVASC) 10 MG tablet TAKE 1  TABLET(10 MG) BY MOUTH DAILY 90  tablet 1  . CLOBETASOL PROPIONATE E 0.05 % emollient cream APPLY EXTERNALLY TO THE AFFECTED AREA TWICE DAILY 45 g prn  . letrozole (FEMARA) 2.5 MG tablet Take 1 tablet (2.5 mg total) by mouth daily. 90 tablet 3  . RED YEAST RICE EXTRACT PO Take 1 tablet by mouth daily.    . simvastatin (ZOCOR) 20 MG tablet TAKE 1 TABLET BY MOUTH EVERY NIGHT AT BEDTIME 90 tablet 3   No current facility-administered medications for this visit.     PHYSICAL EXAMINATION: ECOG PERFORMANCE STATUS: 1 - Symptomatic but completely ambulatory  Vitals:   01/22/19 1300  BP: (!) 145/77  Pulse: (!) 57  Resp: 18  Temp: 99.1 F (37.3 C)  SpO2: 100%   Filed Weights   01/22/19 1300  Weight: 149 lb 3.2 oz (67.7 kg)    GENERAL: alert, no distress and comfortable SKIN: skin color, texture, turgor are normal, no rashes or significant lesions EYES: normal, Conjunctiva are pink and non-injected, sclera clear OROPHARYNX: no exudate, no erythema and lips, buccal mucosa, and tongue normal  NECK: supple, thyroid normal size, non-tender, without nodularity LYMPH: no palpable lymphadenopathy in the cervical, axillary or inguinal LUNGS: clear to auscultation and percussion with normal breathing effort HEART: regular rate & rhythm and no murmurs and no lower extremity edema ABDOMEN: abdomen soft, non-tender and normal bowel sounds MUSCULOSKELETAL: no cyanosis of digits and no clubbing  NEURO: alert & oriented x 3 with fluent speech, no focal motor/sensory deficits EXTREMITIES: No lower extremity edema  LABORATORY DATA:  I have reviewed the data as listed CMP Latest Ref Rng & Units 04/09/2018 10/09/2016 11/08/2015  Glucose 65 - 99 mg/dL 103(H) 95 97  BUN 7 - 25 mg/dL '13 12 11  ' Creatinine 0.50 - 0.99 mg/dL 0.78 0.72 0.69  Sodium 135 - 146 mmol/L 144 143 140  Potassium 3.5 - 5.3 mmol/L 4.1 4.2 4.3  Chloride 98 - 110 mmol/L 107 105 105  CO2 20 - 32 mmol/L '28 22 25  ' Calcium 8.6 - 10.4 mg/dL 9.8 9.8 9.4  Total Protein 6.1 -  8.1 g/dL 7.3 7.8 7.2  Total Bilirubin 0.2 - 1.2 mg/dL 0.9 0.8 0.8  Alkaline Phos 33 - 130 U/L - 80 81  AST 10 - 35 U/L '14 17 17  ' ALT 6 - 29 U/L '15 15 16    ' Lab Results  Component Value Date   WBC 5.8 04/15/2013   HGB 14.3 04/15/2013   HCT 42.0 04/15/2013   MCV 88.4 04/15/2013   PLT 302 04/15/2013   NEUTROABS 2.8 04/15/2013    ASSESSMENT & PLAN:  Malignant neoplasm of upper-outer quadrant of right breast in female, estrogen receptor positive ([place]) 04/23/2018:Palpable lump in right breast UOQ, mammogram revealed 2 separate masses. 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 2.Neoadjuvant antiestrogen therapy12/02/2018-July 2020 3.Followed by breast conserving surgerywith sentinel lymph node surgery7/22/2020 4.Adjuvant radiation therapy 5.Followed by adjuvant antiestrogen therapy ----------------------------------------------------------------------------------------------------------------------------------------------- Right lumpectomy: Residual invasive ductal carcinoma 3.8 cm, margins negative,  2/3 lymph nodes positive, ER 95%, PR 0%, HER-2 negative, Ki-67 10%  T2N1A stage Ib MammaPrint testing: Revealed high-grade HER-2 type of breast cancer. Pathology we reviewed the entire case and felt that this is clearly HER-2 negative. Since I am not fully confident about the MammaPrint test result, we have requested it to be repeated on the biopsy specimen.  Chemo counseling: I discussed risks  and benefits of chemotherapy with dose dense Adriamycin and Cytoxan followed by Taxol weekly x12.  However we will wait for the results of the repeat MammaPrint testing before proceeding with port placement.  I also discussed with her about the role of mastectomy because of the close margins and requirement for additional surgery.  Once we have the MammaPrint results we will arrange her surgical evaluation so that she can undergo the definitive surgery  No orders of the defined types were placed in this encounter.  The patient has a good understanding of the overall plan. she agrees with it. she will call with any problems that may develop before the next visit here.  [name], MD 01/22/2019  Julious Oka Dorshimer am acting as scribe for Dr. [name].  I have reviewed the above documentation for accuracy and completeness, and I agree with the above.

## 2019-01-22 ENCOUNTER — Other Ambulatory Visit: Payer: Self-pay

## 2019-01-22 ENCOUNTER — Inpatient Hospital Stay (HOSPITAL_BASED_OUTPATIENT_CLINIC_OR_DEPARTMENT_OTHER): Payer: Medicare Other | Admitting: Hematology and Oncology

## 2019-01-22 DIAGNOSIS — Z923 Personal history of irradiation: Secondary | ICD-10-CM | POA: Diagnosis not present

## 2019-01-22 DIAGNOSIS — C50811 Malignant neoplasm of overlapping sites of right female breast: Secondary | ICD-10-CM | POA: Diagnosis not present

## 2019-01-22 DIAGNOSIS — Z79811 Long term (current) use of aromatase inhibitors: Secondary | ICD-10-CM | POA: Diagnosis not present

## 2019-01-22 DIAGNOSIS — Z170 Estrogen receptor positive status [ER+]: Secondary | ICD-10-CM

## 2019-01-22 DIAGNOSIS — C50411 Malignant neoplasm of upper-outer quadrant of right female breast: Secondary | ICD-10-CM

## 2019-01-22 DIAGNOSIS — Z79899 Other long term (current) drug therapy: Secondary | ICD-10-CM | POA: Diagnosis not present

## 2019-01-22 NOTE — Assessment & Plan Note (Signed)
04/23/2018:Palpable lump in right breast UOQ, mammogram revealed 2 separate masses. 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 2.Neoadjuvant antiestrogen therapy12/02/2018-July 2020 3.Followed by breast conserving surgerywith sentinel lymph node surgery7/22/2020 4.Adjuvant radiation therapy 5.Followed by adjuvant antiestrogen therapy ----------------------------------------------------------------------------------------------------------------------------------------------- Right lumpectomy: Residual invasive ductal carcinoma 3.8 cm, margins negative, 2/3 lymph nodes positive, ER 95%, PR 0%, HER-2 negative, Ki-67 10%  T2N1A stage Ib MammaPrint testing: Revealed high-grade HER-2 type of breast cancer. Pathology we reviewed the entire case and felt that this is clearly HER-2 negative. Since I am not fully confident about the MammaPrint test result, we have requested it to be repeated on the biopsy specimen.  Chemo counseling: I discussed risks and benefits of chemotherapy with dose dense Adriamycin and Cytoxan followed by Taxol weekly x12.  However we will wait for the results of the repeat MammaPrint testing before proceeding with port placement.  I also discussed with her about the role of mastectomy because of the close margins and requirement for additional surgery.  Once we have the MammaPrint results we will arrange her surgical evaluation so that she can undergo the definitive surgery 

## 2019-01-27 ENCOUNTER — Encounter (HOSPITAL_COMMUNITY): Payer: Self-pay | Admitting: Hematology and Oncology

## 2019-02-02 ENCOUNTER — Telehealth: Payer: Self-pay

## 2019-02-02 MED ORDER — LETROZOLE 2.5 MG PO TABS: 2.5000 mg | ORAL_TABLET | Freq: Every day | ORAL | 0 refills | Status: DC

## 2019-02-02 NOTE — Telephone Encounter (Signed)
RN returned call.  Pt questioning if she needs to continue on Letrozole because she is out and will need refill.    RN reviewed, refill sent for Letrozole.  RN educated pt we are still waiting for mammaprint results, encouraged use until results of this are final and followed up by MD.

## 2019-02-03 ENCOUNTER — Encounter: Payer: Self-pay | Admitting: Hematology and Oncology

## 2019-02-03 ENCOUNTER — Telehealth: Payer: Self-pay | Admitting: *Deleted

## 2019-02-03 NOTE — Progress Notes (Signed)
Patient Care Team: [name], MD as PCP - General (Family Medicine)  DIAGNOSIS:    ICD-10-CM   1. Malignant neoplasm of upper-outer quadrant of right breast in female, estrogen receptor positive ([place])  C50.411    Z17.0     SUMMARY OF ONCOLOGIC HISTORY: Oncology History  Malignant neoplasm of upper-outer quadrant of right breast in female, estrogen receptor positive ([place])  04/23/2018 Initial Diagnosis   Palpable lump in right breast UOQ, mammogram revealed 2 separate masses.  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  The Multi-Gene Panel offered by [facility] includes sequencing and/or deletion duplication testing of the following 85 genes: AIP, ALK, APC, ATM, AXIN2,BAP1,  BARD1, BLM, BMPR1A, BRCA1, BRCA2, BRIP1, CASR, CDC73, CDH1, CDK4, CDKN1B, CDKN1C, CDKN2A (p14ARF), CDKN2A (p16INK4a), CEBPA, CHEK2, CTNNA1, DICER1, DIS3L2, EGFR (c.2369C>T, p.Thr790Met variant only), EPCAM (Deletion/duplication testing only), FH, FLCN, GATA2, GPC3, GREM1 (Promoter region deletion/duplication testing only), HOXB13 (c.251G>A, p.Gly84Glu), HRAS, KIT, MAX, MEN1, MET, MITF (c.952G>A, p.Glu318Lys variant only), MLH1, MSH2, MSH3, MSH6, MUTYH, NBN, NF1, NF2, NTHL1, PALB2, PDGFRA, PHOX2B, PMS2, POLD1, POLE, POT1, PRKAR1A, PTCH1, PTEN, RAD50, RAD51C, RAD51D, RB1, RECQL4, RET, RNF43, RUNX1, SDHAF2, SDHA (sequence changes only), SDHB, SDHC, SDHD, SMAD4, SMARCA4, SMARCB1, SMARCE1, STK11,  SUFU, TERC, TERT, TMEM127, TP53, TSC1, TSC2, VHL, WRN and WT1.      12/24/2018 Surgery   Right lumpectomy [name]): residual invasive carcinoma, clear margins, and 2/3 lymph nodes positive for carcinoma.    01/14/2019 Cancer Staging   Staging form: Breast, AJCC 8th Edition - Pathologic stage from 01/14/2019: No Stage Recommended (ypT2, pN1a, cM0, G2, ER+, PR-, HER2-) - Signed by [name], MD on 01/14/2019   01/22/2019 Miscellaneous   MammaPrint: High risk luminal type B     CHIEF COMPLIANT: Follow-up to discuss treatment plan  INTERVAL HISTORY: Samantha Cross is a 73 y.o. with above-mentioned history of right breast cancer treated with neoadjuvant letrozole, lumpectomy, and for which Mammaprint testing revealed she was high risk. She presents to the clinic today to discuss further treatment with chemotherapy.   REVIEW OF SYSTEMS:   Constitutional: Denies fevers, chills or abnormal weight loss Eyes: Denies blurriness of vision Ears, nose, mouth, throat, and face: Denies mucositis or sore throat Respiratory: Denies cough, dyspnea or wheezes Cardiovascular: Denies palpitation, chest discomfort Gastrointestinal: Denies nausea, heartburn or change in bowel habits Skin: Denies abnormal skin rashes Lymphatics: Denies new lymphadenopathy or easy bruising Neurological: Denies numbness, tingling or new weaknesses Behavioral/Psych: Mood is stable, no new changes  Extremities: No lower extremity edema Breast: denies any pain or lumps or nodules in either breasts All other systems were reviewed with the patient and are negative.  I have reviewed the past medical history, past surgical history, social history and family history with the patient and they are unchanged from previous note.  ALLERGIES:  is allergic to lisinopril; alendronate [alendronate]; and hydroxyzine.  MEDICATIONS:  Current Outpatient Medications  Medication Sig Dispense Refill  amLODipine (NORVASC) 10 MG  tablet TAKE 1 TABLET(10 MG) BY MOUTH DAILY 90 tablet 1   CLOBETASOL PROPIONATE E 0.05 % emollient cream APPLY EXTERNALLY TO THE AFFECTED AREA TWICE DAILY 45 g prn   letrozole (FEMARA) 2.5 MG tablet Take 1 tablet (2.5 mg total) by mouth daily. 90 tablet 0   RED YEAST RICE EXTRACT PO Take 1 tablet by mouth daily.     simvastatin (ZOCOR) 20 MG tablet TAKE 1 TABLET BY MOUTH EVERY NIGHT AT BEDTIME 90 tablet 3   No current facility-administered medications for this visit.     PHYSICAL EXAMINATION: ECOG PERFORMANCE STATUS: 0 - Asymptomatic  Vitals:   02/04/19 0937  BP: (!) 148/77  Pulse: 60  Resp: 18  Temp: 98.5 F (36.9 C)  SpO2: 100%   Filed Weights   02/04/19 0937  Weight: 150 lb (68 kg)    GENERAL: alert, no distress and comfortable SKIN: skin color, texture, turgor are normal, no rashes or significant lesions EYES: normal, Conjunctiva are pink and non-injected, sclera clear OROPHARYNX: no exudate, no erythema and lips, buccal mucosa, and tongue normal  NECK: supple, thyroid normal size, non-tender, without nodularity LYMPH: no palpable lymphadenopathy in the cervical, axillary or inguinal LUNGS: clear to auscultation and percussion with normal breathing effort HEART: regular rate & rhythm and no murmurs and no lower extremity edema ABDOMEN: abdomen soft, non-tender and normal bowel sounds MUSCULOSKELETAL: no cyanosis of digits and no clubbing  NEURO: alert & oriented x 3 with fluent speech, no focal motor/sensory deficits EXTREMITIES: No lower extremity edema  LABORATORY DATA:  I have reviewed the data as listed CMP Latest Ref Rng & Units 04/09/2018 10/09/2016 11/08/2015  Glucose 65 - 99 mg/dL 103(H) 95 97  BUN 7 - 25 mg/dL '13 12 11  ' Creatinine 0.50 - 0.99 mg/dL 0.78 0.72 0.69  Sodium 135 - 146 mmol/L 144 143 140  Potassium 3.5 - 5.3 mmol/L 4.1 4.2 4.3  Chloride 98 - 110 mmol/L 107 105 105  CO2 20 - 32 mmol/L '28 22 25  ' Calcium 8.6 - 10.4 mg/dL 9.8 9.8 9.4  Total Protein  6.1 - 8.1 g/dL 7.3 7.8 7.2  Total Bilirubin 0.2 - 1.2 mg/dL 0.9 0.8 0.8  Alkaline Phos 33 - 130 U/L - 80 81  AST 10 - 35 U/L '14 17 17  ' ALT 6 - 29 U/L '15 15 16    ' Lab Results  Component Value Date   WBC 5.8 04/15/2013   HGB 14.3 04/15/2013   HCT 42.0 04/15/2013   MCV 88.4 04/15/2013   PLT 302 04/15/2013   NEUTROABS 2.8 04/15/2013    ASSESSMENT & PLAN:  Malignant neoplasm of upper-outer quadrant of right breast in female, estrogen receptor positive ([place]) 04/23/2018:Palpable lump in right breast UOQ, mammogram revealed 2 separate masses. 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 2.Neoadjuvant antiestrogen therapy12/02/2018-July 2020 3.Followed by breast conserving surgerywith sentinel lymph node surgery7/22/2020 4.Because of high risk MammaPrint, adjuvant chemotherapy is being recommended with dose dense Adriamycin and Cytoxan followed by  Taxol  5. adjuvant radiation therapy 5.Followed by adjuvant antiestrogen therapy ----------------------------------------------------------------------------------------------------------------------------------------------- Right lumpectomy: Residual invasive ductal carcinoma 3.8 cm, margins negative, 2/3 lymph nodes positive, ER 95%, PR 0%, HER-2 negative, Ki-67 10%  T2N1A stage Ib MammaPrint testing: High risk luminal type B Based on the high risk MammaPrint result, I am recommending systemic chemotherapy.  Chemotherapy Counseling: I discussed the  risks and benefits of chemotherapy including the risks of nausea/ vomiting, risk of infection from low WBC count, fatigue due to chemo or anemia, bruising or bleeding due to low platelets, mouth sores, loss/ change in taste and decreased appetite. Liver and kidney function will be monitored through out chemotherapy as abnormalities in liver and kidney function may be a side effect of treatment.  Taxol-induced neuropathy was also discussed.  Cardiac dysfunction due to Adriamycin were discussed in detail. Risk of permanent bone marrow dysfunction and leukemia due to chemo were also discussed.    We will request port placement, echocardiogram, chemo class Recommend participation neuropathy clinical trial SWOG 1714   Patient will need mastectomy.  Patient wants to do bilateral mastectomies instead. I will send a message to Dr. [name] to contact her. Return to clinic after surgery to then identify the date we can start chemo.   No orders of the defined types were placed in this encounter.  The patient has a good understanding of the overall plan. she agrees with it. she will call with any problems that may develop before the next visit here.  [name], MD 02/04/2019  Julious Oka Dorshimer am acting as scribe for Dr. [name].  I have reviewed the above documentation for accuracy and completeness, and I agree with the above.

## 2019-02-03 NOTE — Telephone Encounter (Signed)
Spoke with patient and confirmed appointment for 9/2 at [place] to discuss chemo.

## 2019-02-04 ENCOUNTER — Other Ambulatory Visit: Payer: Self-pay

## 2019-02-04 ENCOUNTER — Ambulatory Visit: Payer: Self-pay | Admitting: General Surgery

## 2019-02-04 ENCOUNTER — Encounter: Payer: Self-pay | Admitting: *Deleted

## 2019-02-04 ENCOUNTER — Other Ambulatory Visit: Payer: Self-pay | Admitting: *Deleted

## 2019-02-04 ENCOUNTER — Inpatient Hospital Stay: Payer: Medicare Other | Attending: Hematology and Oncology | Admitting: Hematology and Oncology

## 2019-02-04 DIAGNOSIS — Z170 Estrogen receptor positive status [ER+]: Secondary | ICD-10-CM | POA: Diagnosis not present

## 2019-02-04 DIAGNOSIS — Z79811 Long term (current) use of aromatase inhibitors: Secondary | ICD-10-CM | POA: Insufficient documentation

## 2019-02-04 DIAGNOSIS — C50411 Malignant neoplasm of upper-outer quadrant of right female breast: Secondary | ICD-10-CM | POA: Insufficient documentation

## 2019-02-04 DIAGNOSIS — Z79899 Other long term (current) drug therapy: Secondary | ICD-10-CM | POA: Diagnosis not present

## 2019-02-04 NOTE — Research (Signed)
02/04/2019 at 11:14am - The research nurse met with the patient in the clinic this morning after her scheduled appointment with Dr. Lindi Adie. Patient confirms MD gave her a brief explanation of the study, and she has expressed interest in knowing more about study participation. I spoke with patient about study and provided her with the study consent and authorization for her review. The pt was encouraged to take home the forms and read them. I also gave patient a brief explanation of what the study is about, the assessments, including the cardiac MRI, physical function and neurocognitive testing, as well as study questionnaires. I provided patient with information on the study time points and the duration of the study. Patient understands that study is voluntary.  I confirmed with patient that she does not have symptomatic claustrophobia.   At today's visit, she was provided with the study consent, hipaa form, [employer] brochure, and my contact information. Patient was thanked for her time and encouraged to call with questions.  Brion Aliment RN, BSN, CCRP Clinical Research Nurse 02/04/2019 11:20 AM

## 2019-02-04 NOTE — Assessment & Plan Note (Signed)
04/23/2018:Palpable lump in right breast UOQ, mammogram revealed 2 separate masses. 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 2.Neoadjuvant antiestrogen therapy12/02/2018-July 2020 3.Followed by breast conserving surgerywith sentinel lymph node surgery7/22/2020 4.Because of high risk MammaPrint, adjuvant chemotherapy is being recommended with dose dense Adriamycin and Cytoxan followed by Taxol  5. adjuvant radiation therapy 5.Followed by adjuvant antiestrogen therapy ----------------------------------------------------------------------------------------------------------------------------------------------- Right lumpectomy: Residual invasive ductal carcinoma 3.8 cm, margins negative, 2/3 lymph nodes positive, ER 95%, PR 0%, HER-2 negative, Ki-67 10%  T2N1A stage Ib MammaPrint testing: High risk luminal type B Based on the high risk MammaPrint result, I am recommending systemic chemotherapy.  Chemotherapy Counseling: I discussed the risks and benefits of chemotherapy including the risks of nausea/ vomiting, risk of infection from low WBC count, fatigue due to chemo or anemia, bruising or bleeding due to low platelets, mouth sores, loss/ change in taste and decreased appetite. Liver and kidney function will be monitored through out chemotherapy as abnormalities in liver and kidney function may be a side effect of treatment.  Taxol-induced neuropathy was also discussed.  Cardiac dysfunction due to  Adriamycin were discussed in detail. Risk of permanent bone marrow dysfunction and leukemia due to chemo were also discussed.  Return to clinic in 2 weeks for chemotherapy. We will request port placement, echocardiogram, chemo class Recommend participation neuropathy clinical trial SWOG 1714

## 2019-02-12 ENCOUNTER — Other Ambulatory Visit: Payer: Self-pay

## 2019-02-12 ENCOUNTER — Ambulatory Visit (HOSPITAL_COMMUNITY)
Admission: RE | Admit: 2019-02-12 | Discharge: 2019-02-12 | Disposition: A | Discharge status: Home / Self Care | Payer: Medicare Other | Source: Ambulatory Visit | Attending: Hematology and Oncology | Admitting: Hematology and Oncology

## 2019-02-12 DIAGNOSIS — I10 Essential (primary) hypertension: Secondary | ICD-10-CM | POA: Diagnosis not present

## 2019-02-12 DIAGNOSIS — C50411 Malignant neoplasm of upper-outer quadrant of right female breast: Secondary | ICD-10-CM

## 2019-02-12 DIAGNOSIS — Z170 Estrogen receptor positive status [ER+]: Secondary | ICD-10-CM | POA: Diagnosis not present

## 2019-02-12 DIAGNOSIS — E785 Hyperlipidemia, unspecified: Secondary | ICD-10-CM | POA: Insufficient documentation

## 2019-02-12 NOTE — Progress Notes (Signed)
Echocardiogram 2D Echocardiogram has been performed.  Samantha Cross 02/12/2019, 11:39 AM

## 2019-02-16 ENCOUNTER — Telehealth: Payer: Self-pay | Admitting: Hematology and Oncology

## 2019-02-16 NOTE — Telephone Encounter (Signed)
Scheduled appt per 9/10 sch message - pt aware of appt date and time

## 2019-03-02 NOTE — Progress Notes (Addendum)
[id number] - [place], [place] - [address] [place] [address] Phone: [phone] Fax: [phone]      Your procedure is scheduled on October 7th.  Report to [facility] Main Entrance "A" at 6:30 A.M., and check in at the Admitting office.  Call this number if you have problems the morning of surgery:  [phone]  Call [phone] if you have any questions prior to your surgery date Monday-Friday 8am-4pm    Remember:  Do not eat after midnight the night before your surgery  You may drink clear liquids until 5:30 AM the morning of your surgery.   Clear liquids allowed are: Water, Non-Citrus Juices (without pulp), Carbonated Beverages, Clear Tea, Black Coffee Only, and Gatorade      Take these medicines the morning of surgery with A SIP OF WATER   Amlodipine (Norvasc)  Simvastatin (Zocor)  7 days prior to surgery STOP taking any Aspirin (unless otherwise instructed by your surgeon), Aleve, Naproxen, Ibuprofen, Motrin, Advil, Goody's, BC's, all herbal medications, fish oil, and all vitamins.    The Morning of Surgery  Do not wear jewelry, make-up or nail polish.  Do not wear lotions, powders, or perfumes, or deodorant  Do not shave 48 hours prior to surgery.    Do not bring valuables to the hospital.  [facility] is not responsible for any belongings or valuables.  If you are a smoker, DO NOT Smoke 24 hours prior to surgery IF you wear a CPAP at night please bring your mask, tubing, and machine the morning of surgery   Remember that you must have someone to transport you home after your surgery, and remain with you for 24 hours if you are discharged the same day.   Contacts, glasses, hearing aids, dentures or bridgework may not be worn into surgery.    Leave your suitcase in the car.  After surgery it may be brought to your room.  For patients admitted to the hospital, discharge time will be  determined by your treatment team.  Patients discharged the day of surgery will not be allowed to drive home.    Special instructions:   [place]- Preparing For Surgery  Before surgery, you can play an important role. Because skin is not sterile, your skin needs to be as free of germs as possible. You can reduce the number of germs on your skin by washing with CHG (chlorahexidine gluconate) Soap before surgery.  CHG is an antiseptic cleaner which kills germs and bonds with the skin to continue killing germs even after washing.    Oral Hygiene is also important to reduce your risk of infection.  Remember - BRUSH YOUR TEETH THE MORNING OF SURGERY WITH YOUR REGULAR TOOTHPASTE  Please do not use if you have an allergy to CHG or antibacterial soaps. If your skin becomes reddened/irritated stop using the CHG.  Do not shave (including legs and underarms) for at least 48 hours prior to first CHG shower. It is OK to shave your face.  Please follow these instructions carefully.   1. Shower the NIGHT BEFORE SURGERY and the MORNING OF SURGERY with CHG Soap.   2. If you chose to wash your hair, wash your hair first as usual with your normal shampoo.  3. After you shampoo, rinse your hair and body thoroughly to remove the shampoo.  4. Use CHG as you would any other liquid soap. You  can apply CHG directly to the skin and wash gently with a scrungie or a clean washcloth.   5. Apply the CHG Soap to your body ONLY FROM THE NECK DOWN.  Do not use on open wounds or open sores. Avoid contact with your eyes, ears, mouth and genitals (private parts). Wash Face and genitals (private parts)  with your normal soap.   6. Wash thoroughly, paying special attention to the area where your surgery will be performed.  7. Thoroughly rinse your body with warm water from the neck down.  8. DO NOT shower/wash with your normal soap after using and rinsing off the CHG Soap.  9. Pat yourself dry with a CLEAN  TOWEL.  10. Wear CLEAN PAJAMAS to bed the night before surgery, wear comfortable clothes the morning of surgery  11. Place CLEAN SHEETS on your bed the night of your first shower and DO NOT SLEEP WITH PETS.    Day of Surgery:  Do not apply any deodorants/lotions. Please shower the morning of surgery with the CHG soap  Please wear clean clothes to the hospital/surgery center.   Remember to brush your teeth WITH YOUR REGULAR TOOTHPASTE.   Please read over the following fact sheets that you were given.

## 2019-03-03 ENCOUNTER — Other Ambulatory Visit: Payer: Self-pay

## 2019-03-03 ENCOUNTER — Encounter (HOSPITAL_COMMUNITY)
Admission: RE | Admit: 2019-03-03 | Discharge: 2019-03-03 | Disposition: A | Discharge status: Home / Self Care | Payer: Medicare Other | Source: Ambulatory Visit | Attending: General Surgery | Admitting: General Surgery

## 2019-03-03 DIAGNOSIS — Z01812 Encounter for preprocedural laboratory examination: Secondary | ICD-10-CM | POA: Insufficient documentation

## 2019-03-03 DIAGNOSIS — C50911 Malignant neoplasm of unspecified site of right female breast: Secondary | ICD-10-CM | POA: Diagnosis not present

## 2019-03-03 LAB — CBC
HCT: 48.3 % — ABNORMAL HIGH (ref 36.0–46.0)
Hemoglobin: 16 g/dL — ABNORMAL HIGH (ref 12.0–15.0)
MCH: 31 pg (ref 26.0–34.0)
MCHC: 33.1 g/dL (ref 30.0–36.0)
MCV: 93.6 fL (ref 80.0–100.0)
Platelets: 254 10*3/uL (ref 150–400)
RBC: 5.16 MIL/uL — ABNORMAL HIGH (ref 3.87–5.11)
RDW: 12.9 % (ref 11.5–15.5)
WBC: 5.2 10*3/uL (ref 4.0–10.5)
nRBC: 0 % (ref 0.0–0.2)

## 2019-03-03 LAB — BASIC METABOLIC PANEL
Anion gap: 9 (ref 5–15)
BUN: 8 mg/dL (ref 8–23)
CO2: 27 mmol/L (ref 22–32)
Calcium: 9.4 mg/dL (ref 8.9–10.3)
Chloride: 104 mmol/L (ref 98–111)
Creatinine, Ser: 0.75 mg/dL (ref 0.44–1.00)
GFR calc Af Amer: >60 mL/min (ref >60)
GFR calc non Af Amer: >60 mL/min (ref >60)
Glucose, Bld: 103 mg/dL — ABNORMAL HIGH (ref 70–99)
Potassium: 3.7 mmol/L (ref 3.5–5.1)
Sodium: 140 mmol/L (ref 135–145)

## 2019-03-03 MED ORDER — CHLORHEXIDINE GLUCONATE CLOTH 2 % EX PADS: 6.0000 | MEDICATED_PAD | Freq: Once | CUTANEOUS | Status: DC

## 2019-03-03 NOTE — Progress Notes (Signed)
PCP - DR K.METHENEY Cardiologist - NA    Chest x-ray - NONE EKG - 7/20 Stress Test - NA ECHO - 9/20 Cardiac Cath -NA      : Aspirin Instructions:STOP ERAS Protcol -  DIET  COVID TEST- PENDING   Anesthesia review: HTN  Patient denies shortness of breath, fever, cough and chest pain at PAT appointment   Patient verbalized understanding of instructions that were given to them at the PAT appointment. Patient was also instructed that they will need to review over the PAT instructions again at home before surgery.

## 2019-03-07 ENCOUNTER — Other Ambulatory Visit (HOSPITAL_COMMUNITY)
Admission: RE | Admit: 2019-03-07 | Discharge: 2019-03-07 | Disposition: A | Discharge status: Home / Self Care | Payer: Medicare Other | Source: Ambulatory Visit | Attending: General Surgery | Admitting: General Surgery

## 2019-03-07 DIAGNOSIS — Z20828 Contact with and (suspected) exposure to other viral communicable diseases: Secondary | ICD-10-CM | POA: Insufficient documentation

## 2019-03-07 DIAGNOSIS — Z01812 Encounter for preprocedural laboratory examination: Secondary | ICD-10-CM | POA: Insufficient documentation

## 2019-03-09 LAB — NOVEL CORONAVIRUS, NAA (HOSP ORDER, SEND-OUT TO REF LAB; TAT 18-24 HRS): SARS-CoV-2, NAA: NOT DETECTED

## 2019-03-11 ENCOUNTER — Encounter (HOSPITAL_COMMUNITY)
Admission: RE | Disposition: A | Discharge status: Home / Self Care | Payer: Self-pay | Source: Home / Self Care | Attending: General Surgery

## 2019-03-11 ENCOUNTER — Other Ambulatory Visit: Payer: Self-pay

## 2019-03-11 ENCOUNTER — Encounter (HOSPITAL_COMMUNITY): Payer: Self-pay

## 2019-03-11 ENCOUNTER — Ambulatory Visit (HOSPITAL_COMMUNITY): Payer: Medicare Other

## 2019-03-11 ENCOUNTER — Ambulatory Visit (HOSPITAL_COMMUNITY)
Admission: RE | Admit: 2019-03-11 | Discharge: 2019-03-12 | Disposition: A | Discharge status: Home / Self Care | Payer: Medicare Other | Attending: General Surgery | Admitting: General Surgery

## 2019-03-11 ENCOUNTER — Ambulatory Visit (HOSPITAL_COMMUNITY): Payer: Medicare Other | Admitting: Anesthesiology

## 2019-03-11 DIAGNOSIS — Z90710 Acquired absence of both cervix and uterus: Secondary | ICD-10-CM | POA: Diagnosis not present

## 2019-03-11 DIAGNOSIS — Z800 Family history of malignant neoplasm of digestive organs: Secondary | ICD-10-CM | POA: Diagnosis not present

## 2019-03-11 DIAGNOSIS — Z823 Family history of stroke: Secondary | ICD-10-CM | POA: Diagnosis not present

## 2019-03-11 DIAGNOSIS — C50411 Malignant neoplasm of upper-outer quadrant of right female breast: Secondary | ICD-10-CM | POA: Insufficient documentation

## 2019-03-11 DIAGNOSIS — Z8049 Family history of malignant neoplasm of other genital organs: Secondary | ICD-10-CM | POA: Diagnosis not present

## 2019-03-11 DIAGNOSIS — M1990 Unspecified osteoarthritis, unspecified site: Secondary | ICD-10-CM | POA: Insufficient documentation

## 2019-03-11 DIAGNOSIS — Z86010 Personal history of colonic polyps: Secondary | ICD-10-CM | POA: Insufficient documentation

## 2019-03-11 DIAGNOSIS — C50911 Malignant neoplasm of unspecified site of right female breast: Secondary | ICD-10-CM | POA: Diagnosis present

## 2019-03-11 DIAGNOSIS — I10 Essential (primary) hypertension: Secondary | ICD-10-CM | POA: Diagnosis not present

## 2019-03-11 DIAGNOSIS — Z803 Family history of malignant neoplasm of breast: Secondary | ICD-10-CM | POA: Diagnosis not present

## 2019-03-11 DIAGNOSIS — Z8249 Family history of ischemic heart disease and other diseases of the circulatory system: Secondary | ICD-10-CM | POA: Insufficient documentation

## 2019-03-11 DIAGNOSIS — Z8041 Family history of malignant neoplasm of ovary: Secondary | ICD-10-CM | POA: Insufficient documentation

## 2019-03-11 DIAGNOSIS — Z808 Family history of malignant neoplasm of other organs or systems: Secondary | ICD-10-CM | POA: Diagnosis not present

## 2019-03-11 DIAGNOSIS — Z888 Allergy status to other drugs, medicaments and biological substances status: Secondary | ICD-10-CM | POA: Diagnosis not present

## 2019-03-11 DIAGNOSIS — Z170 Estrogen receptor positive status [ER+]: Secondary | ICD-10-CM | POA: Insufficient documentation

## 2019-03-11 DIAGNOSIS — Z452 Encounter for adjustment and management of vascular access device: Secondary | ICD-10-CM | POA: Diagnosis not present

## 2019-03-11 DIAGNOSIS — G8918 Other acute postprocedural pain: Secondary | ICD-10-CM | POA: Diagnosis not present

## 2019-03-11 DIAGNOSIS — Z853 Personal history of malignant neoplasm of breast: Secondary | ICD-10-CM | POA: Diagnosis not present

## 2019-03-11 DIAGNOSIS — Z87891 Personal history of nicotine dependence: Secondary | ICD-10-CM | POA: Diagnosis not present

## 2019-03-11 DIAGNOSIS — Z79899 Other long term (current) drug therapy: Secondary | ICD-10-CM | POA: Diagnosis not present

## 2019-03-11 DIAGNOSIS — Z419 Encounter for procedure for purposes other than remedying health state, unspecified: Secondary | ICD-10-CM

## 2019-03-11 DIAGNOSIS — Z95828 Presence of other vascular implants and grafts: Secondary | ICD-10-CM

## 2019-03-11 HISTORY — PX: PORTACATH PLACEMENT: SHX2246

## 2019-03-11 HISTORY — PX: TOTAL MASTECTOMY: SHX6129

## 2019-03-11 SURGERY — MASTECTOMY, SIMPLE
Anesthesia: General | Site: Neck | Laterality: Left

## 2019-03-11 MED ORDER — ONDANSETRON HCL 4 MG/2ML IJ SOLN
4.0000 mg | Freq: Four times a day (QID) | INTRAMUSCULAR | Status: DC | PRN
  Administered 2019-03-11: 4 mg via INTRAVENOUS
  Filled 2019-03-11: qty 2

## 2019-03-11 MED ORDER — FENTANYL CITRATE (PF) 100 MCG/2ML IJ SOLN
INTRAMUSCULAR | Status: DC | PRN
  Administered 2019-03-11 (×3): 50 ug via INTRAVENOUS

## 2019-03-11 MED ORDER — HYDROMORPHONE HCL 1 MG/ML IJ SOLN
INTRAMUSCULAR | Status: AC
  Filled 2019-03-11: qty 1

## 2019-03-11 MED ORDER — SIMVASTATIN 20 MG PO TABS
20.0000 mg | ORAL_TABLET | Freq: Every day | ORAL | Status: DC
  Filled 2019-03-11: qty 1

## 2019-03-11 MED ORDER — HYDROCODONE-ACETAMINOPHEN 5-325 MG PO TABS
1.0000 | ORAL_TABLET | ORAL | Status: DC | PRN
  Administered 2019-03-11: 2 via ORAL

## 2019-03-11 MED ORDER — ONDANSETRON HCL 4 MG/2ML IJ SOLN
INTRAMUSCULAR | Status: AC
  Filled 2019-03-11: qty 2

## 2019-03-11 MED ORDER — PROPOFOL 10 MG/ML IV BOLUS
INTRAVENOUS | Status: AC
  Filled 2019-03-11: qty 40

## 2019-03-11 MED ORDER — MIDAZOLAM HCL 5 MG/5ML IJ SOLN
INTRAMUSCULAR | Status: DC | PRN
  Administered 2019-03-11: 2 mg via INTRAVENOUS

## 2019-03-11 MED ORDER — SODIUM CHLORIDE 0.9 % IV SOLN
INTRAVENOUS | Status: DC | PRN
  Administered 2019-03-11: 100 mL

## 2019-03-11 MED ORDER — DEXAMETHASONE SODIUM PHOSPHATE 10 MG/ML IJ SOLN
INTRAMUSCULAR | Status: AC
  Filled 2019-03-11: qty 1

## 2019-03-11 MED ORDER — CEFAZOLIN SODIUM-DEXTROSE 2-4 GM/100ML-% IV SOLN
2.0000 g | INTRAVENOUS | Status: AC
  Administered 2019-03-11: 2 g via INTRAVENOUS
  Filled 2019-03-11: qty 100

## 2019-03-11 MED ORDER — MORPHINE SULFATE (PF) 2 MG/ML IV SOLN
1.0000 mg | INTRAVENOUS | Status: DC | PRN
  Administered 2019-03-11: 1 mg via INTRAVENOUS
  Filled 2019-03-11: qty 1

## 2019-03-11 MED ORDER — LETROZOLE 2.5 MG PO TABS
2.5000 mg | ORAL_TABLET | Freq: Every day | ORAL | Status: DC
  Administered 2019-03-11: 2.5 mg via ORAL
  Filled 2019-03-11 (×2): qty 1

## 2019-03-11 MED ORDER — CELECOXIB 200 MG PO CAPS
200.0000 mg | ORAL_CAPSULE | ORAL | Status: AC
  Administered 2019-03-11: 07:00:00 200 mg via ORAL
  Filled 2019-03-11: qty 1

## 2019-03-11 MED ORDER — KCL IN DEXTROSE-NACL 20-5-0.9 MEQ/L-%-% IV SOLN
INTRAVENOUS | Status: DC
  Administered 2019-03-11: 15:00:00 via INTRAVENOUS
  Filled 2019-03-11: qty 1000

## 2019-03-11 MED ORDER — SODIUM CHLORIDE 0.9 % IV SOLN
INTRAVENOUS | Status: AC
  Filled 2019-03-11: qty 1.2

## 2019-03-11 MED ORDER — BUPIVACAINE LIPOSOME 1.3 % IJ SUSP
INTRAMUSCULAR | Status: DC | PRN
  Administered 2019-03-11 (×2): 5 mL

## 2019-03-11 MED ORDER — GABAPENTIN 300 MG PO CAPS
300.0000 mg | ORAL_CAPSULE | ORAL | Status: AC
  Administered 2019-03-11: 300 mg via ORAL
  Filled 2019-03-11: qty 1

## 2019-03-11 MED ORDER — HEPARIN SOD (PORK) LOCK FLUSH 100 UNIT/ML IV SOLN
INTRAVENOUS | Status: DC | PRN
  Administered 2019-03-11: 500 [IU]

## 2019-03-11 MED ORDER — BUPIVACAINE HCL (PF) 0.25 % IJ SOLN
INTRAMUSCULAR | Status: DC | PRN
  Administered 2019-03-11: 7 mL

## 2019-03-11 MED ORDER — MIDAZOLAM HCL 2 MG/2ML IJ SOLN
INTRAMUSCULAR | Status: AC
  Filled 2019-03-11: qty 2

## 2019-03-11 MED ORDER — HYDROMORPHONE HCL 1 MG/ML IJ SOLN
0.2500 mg | INTRAMUSCULAR | Status: DC | PRN
  Administered 2019-03-11 (×3): 0.25 mg via INTRAVENOUS

## 2019-03-11 MED ORDER — LIDOCAINE 2% (20 MG/ML) 5 ML SYRINGE
INTRAMUSCULAR | Status: AC
  Filled 2019-03-11: qty 5

## 2019-03-11 MED ORDER — ROCURONIUM BROMIDE 100 MG/10ML IV SOLN
INTRAVENOUS | Status: DC | PRN
  Administered 2019-03-11 (×2): 20 mg via INTRAVENOUS
  Administered 2019-03-11: 50 mg via INTRAVENOUS

## 2019-03-11 MED ORDER — PHENYLEPHRINE 40 MCG/ML (10ML) SYRINGE FOR IV PUSH (FOR BLOOD PRESSURE SUPPORT)
PREFILLED_SYRINGE | INTRAVENOUS | Status: AC
  Filled 2019-03-11: qty 10

## 2019-03-11 MED ORDER — HEPARIN SOD (PORK) LOCK FLUSH 100 UNIT/ML IV SOLN
INTRAVENOUS | Status: AC
  Filled 2019-03-11: qty 5

## 2019-03-11 MED ORDER — LACTATED RINGERS IV SOLN
INTRAVENOUS | Status: DC | PRN
  Administered 2019-03-11 (×2): via INTRAVENOUS

## 2019-03-11 MED ORDER — DEXAMETHASONE SODIUM PHOSPHATE 10 MG/ML IJ SOLN
INTRAMUSCULAR | Status: DC | PRN
  Administered 2019-03-11: 10 mg via INTRAVENOUS

## 2019-03-11 MED ORDER — PHENYLEPHRINE HCL (PRESSORS) 10 MG/ML IV SOLN
INTRAVENOUS | Status: AC
  Filled 2019-03-11: qty 1

## 2019-03-11 MED ORDER — SUCCINYLCHOLINE CHLORIDE 200 MG/10ML IV SOSY
PREFILLED_SYRINGE | INTRAVENOUS | Status: DC | PRN
  Administered 2019-03-11: 80 mg via INTRAVENOUS

## 2019-03-11 MED ORDER — METHOCARBAMOL 500 MG PO TABS
500.0000 mg | ORAL_TABLET | Freq: Four times a day (QID) | ORAL | Status: DC | PRN
  Administered 2019-03-11 – 2019-03-12 (×2): 500 mg via ORAL
  Filled 2019-03-11 (×2): qty 1

## 2019-03-11 MED ORDER — BUPIVACAINE HCL (PF) 0.25 % IJ SOLN
INTRAMUSCULAR | Status: AC
  Filled 2019-03-11: qty 30

## 2019-03-11 MED ORDER — ACETAMINOPHEN 500 MG PO TABS
1000.0000 mg | ORAL_TABLET | ORAL | Status: AC
  Administered 2019-03-11: 1000 mg via ORAL
  Filled 2019-03-11: qty 2

## 2019-03-11 MED ORDER — PANTOPRAZOLE SODIUM 40 MG IV SOLR
40.0000 mg | Freq: Every day | INTRAVENOUS | Status: DC
  Administered 2019-03-11: 40 mg via INTRAVENOUS
  Filled 2019-03-11: qty 40

## 2019-03-11 MED ORDER — ONDANSETRON HCL 4 MG/2ML IJ SOLN
INTRAMUSCULAR | Status: DC | PRN
  Administered 2019-03-11: 4 mg via INTRAVENOUS

## 2019-03-11 MED ORDER — BUPIVACAINE HCL (PF) 0.25 % IJ SOLN
INTRAMUSCULAR | Status: DC | PRN
  Administered 2019-03-11 (×2): 25 mL

## 2019-03-11 MED ORDER — SODIUM CHLORIDE 0.9 % IV SOLN
INTRAVENOUS | Status: DC | PRN
  Administered 2019-03-11: 10:00:00 20 ug/min via INTRAVENOUS

## 2019-03-11 MED ORDER — PHENYLEPHRINE 40 MCG/ML (10ML) SYRINGE FOR IV PUSH (FOR BLOOD PRESSURE SUPPORT)
PREFILLED_SYRINGE | INTRAVENOUS | Status: DC | PRN
  Administered 2019-03-11 (×3): 80 ug via INTRAVENOUS
  Administered 2019-03-11 (×2): 40 ug via INTRAVENOUS
  Administered 2019-03-11: 80 ug via INTRAVENOUS

## 2019-03-11 MED ORDER — SUGAMMADEX SODIUM 200 MG/2ML IV SOLN
INTRAVENOUS | Status: DC | PRN
  Administered 2019-03-11: 150 mg via INTRAVENOUS

## 2019-03-11 MED ORDER — AMLODIPINE BESYLATE 10 MG PO TABS
10.0000 mg | ORAL_TABLET | Freq: Every day | ORAL | Status: DC
  Administered 2019-03-11: 10 mg via ORAL
  Filled 2019-03-11 (×2): qty 1

## 2019-03-11 MED ORDER — ONDANSETRON 4 MG PO TBDP: 4.0000 mg | ORAL_TABLET | Freq: Four times a day (QID) | ORAL | Status: DC | PRN

## 2019-03-11 MED ORDER — 0.9 % SODIUM CHLORIDE (POUR BTL) OPTIME
TOPICAL | Status: DC | PRN
  Administered 2019-03-11: 10:00:00 1000 mL

## 2019-03-11 MED ORDER — HYDROCODONE-ACETAMINOPHEN 5-325 MG PO TABS
ORAL_TABLET | ORAL | Status: AC
  Filled 2019-03-11: qty 2

## 2019-03-11 MED ORDER — FENTANYL CITRATE (PF) 250 MCG/5ML IJ SOLN
INTRAMUSCULAR | Status: AC
  Filled 2019-03-11: qty 5

## 2019-03-11 MED ORDER — PROMETHAZINE HCL 25 MG/ML IJ SOLN
12.5000 mg | Freq: Four times a day (QID) | INTRAMUSCULAR | Status: DC | PRN
  Filled 2019-03-11: qty 1

## 2019-03-11 MED ORDER — HEPARIN SODIUM (PORCINE) 5000 UNIT/ML IJ SOLN
5000.0000 [IU] | Freq: Three times a day (TID) | INTRAMUSCULAR | Status: DC
  Administered 2019-03-12: 5000 [IU] via SUBCUTANEOUS
  Filled 2019-03-11: qty 1

## 2019-03-11 MED ORDER — SUCCINYLCHOLINE CHLORIDE 200 MG/10ML IV SOSY
PREFILLED_SYRINGE | INTRAVENOUS | Status: AC
  Filled 2019-03-11: qty 10

## 2019-03-11 MED ORDER — ROCURONIUM BROMIDE 10 MG/ML (PF) SYRINGE
PREFILLED_SYRINGE | INTRAVENOUS | Status: AC
  Filled 2019-03-11: qty 10

## 2019-03-11 MED ORDER — PROPOFOL 10 MG/ML IV BOLUS
INTRAVENOUS | Status: DC | PRN
  Administered 2019-03-11: 130 mg via INTRAVENOUS

## 2019-03-11 SURGICAL SUPPLY — 64 items
APPLIER CLIP 11 MED OPEN (CLIP) ×9
APPLIER CLIP 9.375 MED OPEN (MISCELLANEOUS) ×3
BAG DECANTER FOR FLEXI CONT (MISCELLANEOUS) ×3 IMPLANT
BINDER BREAST LRG (GAUZE/BANDAGES/DRESSINGS) ×3 IMPLANT
BINDER BREAST XLRG (GAUZE/BANDAGES/DRESSINGS) IMPLANT
BIOPATCH RED 1 DISK 7.0 (GAUZE/BANDAGES/DRESSINGS) ×6 IMPLANT
CANISTER SUCT 3000ML PPV (MISCELLANEOUS) ×3 IMPLANT
CHLORAPREP W/TINT 10.5 ML (MISCELLANEOUS) ×6 IMPLANT
CHLORAPREP W/TINT 26 (MISCELLANEOUS) ×3 IMPLANT
CLIP APPLIE 11 MED OPEN (CLIP) ×6 IMPLANT
CLIP APPLIE 9.375 MED OPEN (MISCELLANEOUS) ×2 IMPLANT
COVER SURGICAL LIGHT HANDLE (MISCELLANEOUS) ×3 IMPLANT
COVER TRANSDUCER ULTRASND GEL (DRAPE) ×3 IMPLANT
COVER WAND RF STERILE (DRAPES) ×3 IMPLANT
DECANTER SPIKE VIAL GLASS SM (MISCELLANEOUS) ×3 IMPLANT
DERMABOND ADVANCED (GAUZE/BANDAGES/DRESSINGS) ×2
DERMABOND ADVANCED .7 DNX12 (GAUZE/BANDAGES/DRESSINGS) ×4 IMPLANT
DEVICE DISSECT PLASMABLAD 3.0S (MISCELLANEOUS) ×2 IMPLANT
DRAIN CHANNEL 19F RND (DRAIN) ×6 IMPLANT
DRAPE C-ARM 42X72 X-RAY (DRAPES) ×3 IMPLANT
DRAPE LAPAROSCOPIC ABDOMINAL (DRAPES) ×3 IMPLANT
DRSG PAD ABDOMINAL 8X10 ST (GAUZE/BANDAGES/DRESSINGS) ×6 IMPLANT
ELECT CAUTERY BLADE 6.4 (BLADE) ×3 IMPLANT
ELECT COATED BLADE 2.86 ST (ELECTRODE) ×3 IMPLANT
ELECT REM PT RETURN 9FT ADLT (ELECTROSURGICAL) ×3
ELECTRODE REM PT RTRN 9FT ADLT (ELECTROSURGICAL) ×2 IMPLANT
EVACUATOR SILICONE 100CC (DRAIN) ×6 IMPLANT
FILTER IN LINE W/DETACHED HOSE (FILTER) ×3 IMPLANT
GAUZE 4X4 16PLY RFD (DISPOSABLE) ×3 IMPLANT
GAUZE SPONGE 4X4 12PLY STRL (GAUZE/BANDAGES/DRESSINGS) ×3 IMPLANT
GEL ULTRASOUND 20GR AQUASONIC (MISCELLANEOUS) ×3 IMPLANT
GLOVE BIO SURGEON STRL SZ7.5 (GLOVE) ×3 IMPLANT
GOWN STRL REUS W/ TWL LRG LVL3 (GOWN DISPOSABLE) ×4 IMPLANT
GOWN STRL REUS W/TWL LRG LVL3 (GOWN DISPOSABLE) ×2
KIT BASIN OR (CUSTOM PROCEDURE TRAY) ×3 IMPLANT
KIT PORT POWER 8FR ISP CVUE (Port) ×3 IMPLANT
KIT TURNOVER KIT B (KITS) ×3 IMPLANT
LIGHT WAVEGUIDE WIDE FLAT (MISCELLANEOUS) IMPLANT
NEEDLE 22X1 1/2 (OR ONLY) (NEEDLE) ×3 IMPLANT
NS IRRIG 1000ML POUR BTL (IV SOLUTION) ×3 IMPLANT
PACK GENERAL/GYN (CUSTOM PROCEDURE TRAY) ×3 IMPLANT
PAD ABD 8X10 STRL (GAUZE/BANDAGES/DRESSINGS) ×3 IMPLANT
PAD ARMBOARD 7.5X6 YLW CONV (MISCELLANEOUS) ×3 IMPLANT
PENCIL BUTTON HOLSTER BLD 10FT (ELECTRODE) ×3 IMPLANT
PENCIL SMOKE EVACUATOR (MISCELLANEOUS) ×3 IMPLANT
PLASMABLADE 3.0S (MISCELLANEOUS) ×3
POSITIONER HEAD DONUT 9IN (MISCELLANEOUS) ×3 IMPLANT
SHEATH COOK PEEL AWAY SET 9F (SHEATH) IMPLANT
SPECIMEN JAR X LARGE (MISCELLANEOUS) ×3 IMPLANT
SUT ETHILON 3 0 FSL (SUTURE) ×6 IMPLANT
SUT MNCRL AB 4-0 PS2 18 (SUTURE) ×9 IMPLANT
SUT MON AB 4-0 PC3 18 (SUTURE) ×3 IMPLANT
SUT PROLENE 2 0 SH 30 (SUTURE) ×3 IMPLANT
SUT VIC AB 3-0 54X BRD REEL (SUTURE) IMPLANT
SUT VIC AB 3-0 BRD 54 (SUTURE)
SUT VIC AB 3-0 SH 18 (SUTURE) ×9 IMPLANT
SUT VIC AB 3-0 SH 27 (SUTURE) ×1
SUT VIC AB 3-0 SH 27XBRD (SUTURE) ×2 IMPLANT
SYR 10ML LL (SYRINGE) ×3 IMPLANT
SYR 5ML LUER SLIP (SYRINGE) ×3 IMPLANT
TOWEL GREEN STERILE (TOWEL DISPOSABLE) ×3 IMPLANT
TOWEL GREEN STERILE FF (TOWEL DISPOSABLE) ×3 IMPLANT
TRAY LAPAROSCOPIC MC (CUSTOM PROCEDURE TRAY) ×3 IMPLANT
TUBE CONNECTING 12X1/4 (SUCTIONS) IMPLANT

## 2019-03-11 NOTE — Progress Notes (Signed)
Patient admitted to unit from PACU, VSS. Patient settled in bed and oriented to unit and hospital routine. Patient has bilateral mastectomy with skin glue on incision sites and ABD pads, 2 JP drains in place and breast binder on.  Pt resting comfortably in bed with call bell in reach and side rails up. Instructed patient to utilize call bell for assistance. Will continue to monitor closely for remainder of shift.

## 2019-03-11 NOTE — Op Note (Signed)
03/11/2019  12:10 PM  PATIENT:  Samantha Cross  73 y.o. female  PRE-OPERATIVE DIAGNOSIS:  right breast cancer  POST-OPERATIVE DIAGNOSIS:  right breast cancer  PROCEDURE:  Procedure(s): BILATERAL  MASTECTOMIES (Bilateral) INSERTION PORT-A-CATH   SURGEON:  Surgeon(s) and Role:    * Jovita Kussmaul, MD - Primary  PHYSICIAN ASSISTANT:   ASSISTANTS: none   ANESTHESIA:   general  EBL:  30 mL   BLOOD ADMINISTERED:none  DRAINS: (2) Jackson-Pratt drain(s) with closed bulb suction in the prepectoral space   LOCAL MEDICATIONS USED:  NONE  SPECIMEN:  Source of Specimen:  bilateral mastectomies and one right axillary lymph node  DISPOSITION OF SPECIMEN:  PATHOLOGY  COUNTS:  YES  TOURNIQUET:  * No tourniquets in log *  DICTATION: .Dragon Dictation   After informed consent was obtained the patient was brought to the operating room and placed in the supine position on the operating table.  After adequate induction of general anesthesia a roll was placed between the patient's shoulders to extend the shoulder slightly.  The left chest and neck area were then prepped with ChloraPrep, allowed to dry, and draped in usual sterile manner.  An appropriate timeout was performed.  The area lateral to the bend of the clavicle was infiltrated with quarter percent Marcaine.  The patient was placed in Trendelenburg position.  A large bore needle from the Port-A-Cath kit was used to slide beneath the bend of the clavicle heading towards the sternal notch.  I was never able to access the left subclavian vein.  Because of this I decided to move up to the neck.  I was able to identify the location of the left internal jugular vein using a 22-gauge needle and syringe.  I then used the large bore needle from the Port-A-Cath kit access the left internal jugular vein without difficulty.  The wire was fed through the needle using the Seldinger technique without difficulty.  The wire was confirmed in the  central venous system using real-time fluoroscopy.  Next a small incision was made on the left chest wall for the reservoir.  A subcutaneous pocket was created inferior to the incision by blunt finger dissection.  A small incision in the skin was made at the wire entry site in the neck.  The 2 incisions were then connected by blunt hemostat dissection.  I was then able to bring the tubing through the tunnel.  The tubing was attached to the reservoir.  Next a sheath and dilator were fed over the wire using the Seldinger technique.  The dilator and wire were removed.  The tubing was fed through the sheath as far as it would go and then held in place while the sheath was gently cracked and separated.  Another real-time fluoroscopy image showed the tip of the catheter to be in the distal superior vena cava.  The tubing was then permanently anchored to the reservoir.  The reservoir was anchored in the pocket with two 2-0 Prolene stitches.  The port was then aspirated and it aspirated blood easily.  The port was then flushed initially with a dilute heparin solution and then with a more concentrated heparin solution.  The subcutaneous tissue was then closed over the port with interrupted 3-0 Vicryl stitches.  The skin was closed with a running 4-0 Monocryl subcuticular stitch.  The incision in the neck was also closed with a single 4-0 Monocryl subcuticular stitch.  Dermabond dressings were applied.  The patient tolerated this portion of  the procedure well.  The drapes were removed.  Next the patient's bilateral chest, breast, and axillary area were prepped with ChloraPrep, allowed to dry, and draped in usual sterile manner.  An appropriate timeout was performed.  Attention was first turned to the left breast.  An elliptical incision was made around the nipple and areola complex in order to minimize the excess skin.  The incision was carried through the skin and subcutaneous tissue sharply with the plasma blade.  Breast  hooks were used and used to elevate the skin flaps anteriorly towards the ceiling.  Thin skin flaps were then created circumferentially by dissection between the breast tissue and the subcutaneous fat.  This dissection was carried all the way to the chest wall circumferentially.  Next the breast was removed from the pectoralis muscle with the pectoralis fascia.  Once the breast was completely removed it was oriented with a stitch on the lateral skin and sent to pathology for further evaluation.  Hemostasis was achieved using the plasma blade.  The wound was irrigated with saline.  A small stab incision was made near the anterior axillary line inferior to the operative bed.  A tonsil clamp was placed through this opening and used to bring a 19 [place] round Blake drain into the operative bed.  The drain was curled along the chest wall.  Next the superior and inferior skin flaps were grossly reapproximated with interrupted 3-0 Vicryl stitches.  The skin was then closed with a running 4-0 Monocryl subcuticular stitch.  The drain was placed to bulb suction and there was a good seal.  The skin flaps were healthy appearing.  Attention was then turned to the right breast.  A similar elliptical incision was made around the nipple and areola complex in order to minimize the excess skin.  The incision was carried through the skin and subcutaneous tissue sharply with the plasma blade.  Breast hooks were then used to elevate the skin flaps anteriorly towards the ceiling.  Thin skin flaps were then created circumferentially by dissection between the subcutaneous tissue and the breast tissue.  This dissection was carried all the way to the chest wall circumferentially.  Next the breast was removed from the pectoralis muscle with the pectoralis fascia.  Once the breast was completely removed it was oriented with a stitch on the lateral skin and sent to pathology for further evaluation.  A single palpable lymph node in the right  axilla was removed and also sent separately to pathology for further evaluation.  A small stab incision was then made near the anterior axillary line inferior to the operative bed.  A tonsil clamp was placed through this opening and used to bring a 19 [place] round Blake drain into the operative bed.  The wound was irrigated with copious amounts of saline.  Hemostasis was achieved using the plasma blade.  Next the superior and inferior skin flaps were grossly reapproximated with interrupted 3-0 Vicryl stitches.  The skin was then closed with a running 4-0 Monocryl subcuticular stitch.  The drains were anchored to the skin with 3-0 nylon stitches.  The drain was placed to bulb suction and there was a good seal.  The skin flaps appeared healthy.  Dermabond dressings were then applied as well as dressings around the drains.  The patient tolerated the procedure well.  At the end of the case all needle sponge and instrument counts were correct.  The patient was then awakened and taken to recovery in stable condition.  PLAN OF CARE: Admit for overnight observation  PATIENT DISPOSITION:  PACU - hemodynamically stable.   Delay start of Pharmacological VTE agent (>24hrs) due to surgical blood loss or risk of bleeding: no

## 2019-03-11 NOTE — Anesthesia Preprocedure Evaluation (Addendum)
Anesthesia Evaluation  Patient identified by MRN, date of birth, ID band Patient awake    Reviewed: Allergy & Precautions, H&P , NPO status , Patient's Chart, lab work & pertinent test results  Airway Mallampati: II  TM Distance: >3 FB Neck ROM: Full    Dental no notable dental hx. (+) Teeth Intact, Dental Advisory Given   Pulmonary neg pulmonary ROS, former smoker,    Pulmonary exam normal breath sounds clear to auscultation       Cardiovascular hypertension, Pt. on medications  Rhythm:Regular Rate:Normal     Neuro/Psych negative neurological ROS  negative psych ROS   GI/Hepatic negative GI ROS, Neg liver ROS,   Endo/Other  negative endocrine ROS  Renal/GU negative Renal ROS  negative genitourinary   Musculoskeletal  (+) Arthritis ,   Abdominal   Peds  Hematology negative hematology ROS (+)   Anesthesia Other Findings   Reproductive/Obstetrics negative OB ROS                            Anesthesia Physical Anesthesia Plan  ASA: II  Anesthesia Plan: General   Post-op Pain Management:  Regional for Post-op pain   Induction: Intravenous  PONV Risk Score and Plan: 4 or greater and Ondansetron, Dexamethasone and Midazolam  Airway Management Planned: Oral ETT  Additional Equipment:   Intra-op Plan:   Post-operative Plan: Extubation in OR  Informed Consent: I have reviewed the patients History and Physical, chart, labs and discussed the procedure including the risks, benefits and alternatives for the proposed anesthesia with the patient or authorized representative who has indicated his/her understanding and acceptance.     Dental advisory given  Plan Discussed with: CRNA  Anesthesia Plan Comments:         Anesthesia Quick Evaluation

## 2019-03-11 NOTE — H&P (Signed)
NYX KEADY Documented: 10/08/2018 11:04 AM Location: [facility] Patient #: [id number] DOB: [birth year] Married / Language: English / Race: White Female   History of Present Illness The patient is a 73 year old female who presents for a follow-up for Breast cancer. The patient is a 73 year old white female who was diagnosed with a 5.3 cm cancer in the upper outer quadrant of the right breast that was ER positive and PR negative and HER-2 negative with a Ki-67 of 10%. She was treated with neoadjuvant hormone therapy and her cancer has shrunk from 5.7 cm by MRI to 3.7 cm. The middle of general marker six-month time point and she will be ready to schedule surgery at that point. She is favoring breast conservation.   Problem List/Past Medical MALIGNANT NEOPLASM OF UPPER-OUTER QUADRANT OF RIGHT BREAST IN FEMALE, ESTROGEN RECEPTOR POSITIVE (C50.411)   Past Surgical History Breast Biopsy  Right. Cesarean Section - 1  Colon Polyp Removal - Colonoscopy  Hysterectomy (not due to cancer) - Complete   Diagnostic Studies History Mammogram  within last year  Allergies  Lisinopril *ANTIHYPERTENSIVES*  Alendronate Sodium *ENDOCRINE AND METABOLIC AGENTS - MISC.*  hydrOXYzine HCl *ANTIANXIETY AGENTS*   Medication History  amLODIPine Besylate (10MG Tablet, Oral) Active. Clobetasol Propionate E (0.05% Cream, External) Active. Simvastatin (20MG Tablet, Oral) Active. Red Yeast Rice (Oral) Specific strength unknown - Active. Medications Reconciled  Social History  Alcohol use  Occasional alcohol use. Caffeine use  Coffee.  Family History ( Breast Cancer  Sister. Cerebrovascular Accident  Father, Mother. Cervical Cancer  Mother. Colon Cancer  Brother, Mother. Colon Polyps  Mother. Heart Disease  Father. Hypertension  Father. Melanoma  Brother. Ovarian Cancer  Mother.  Pregnancy / Birth History  Age at menarche  32 years. Gravida  2 Para   2  Other Problems  Breast Cancer     Review of Systems General Not Present- Appetite Loss, Chills, Fatigue, Fever, Night Sweats, Weight Gain and Weight Loss. Skin Not Present- Change in Wart/Mole, Dryness, Hives, Jaundice, New Lesions, Non-Healing Wounds, Rash and Ulcer. HEENT Not Present- Earache, Hearing Loss, Hoarseness, Nose Bleed, Oral Ulcers, Ringing in the Ears, Seasonal Allergies, Sinus Pain, Sore Throat, Visual Disturbances, Wears glasses/contact lenses and Yellow Eyes. Cardiovascular Not Present- Chest Pain, Difficulty Breathing Lying Down, Leg Cramps, Palpitations, Rapid Heart Rate, Shortness of Breath and Swelling of Extremities. Gastrointestinal Not Present- Abdominal Pain, Bloating, Bloody Stool, Change in Bowel Habits, Chronic diarrhea, Constipation, Difficulty Swallowing, Excessive gas, Gets full quickly at meals, Hemorrhoids, Indigestion, Nausea, Rectal Pain and Vomiting. Female Genitourinary Not Present- Frequency, Nocturia, Painful Urination, Pelvic Pain and Urgency. Musculoskeletal Not Present- Back Pain, Joint Pain, Joint Stiffness, Muscle Pain, Muscle Weakness and Swelling of Extremities. Neurological Not Present- Decreased Memory, Fainting, Headaches, Numbness, Seizures, Tingling, Tremor, Trouble walking and Weakness. Psychiatric Not Present- Anxiety, Bipolar, Change in Sleep Pattern, Depression, Fearful and Frequent crying. Endocrine Not Present- Cold Intolerance, Excessive Hunger, Hair Changes, Heat Intolerance, Hot flashes and New Diabetes. Hematology Not Present- Blood Thinners, Easy Bruising, Excessive bleeding, Gland problems, HIV and Persistent Infections.  Vitals  Weight: 151.25 lb Height: 64in Body Surface Area: 1.74 m Body Mass Index: 25.96 kg/m  Pulse: 64 (Regular)  BP: 120/82(Sitting, Left Arm, Standard)       Physical Exam  General Mental Status-Alert. General Appearance-Consistent with stated age. Hydration-Well  hydrated. Voice-Normal.  Head and Neck Head-normocephalic, atraumatic with no lesions or palpable masses. Trachea-midline. Thyroid Gland Characteristics - normal size and consistency.  Eye Eyeball -  Bilateral-Extraocular movements intact. Sclera/Conjunctiva - Bilateral-No scleral icterus.  Chest and Lung Exam Chest and lung exam reveals -quiet, even and easy respiratory effort with no use of accessory muscles and on auscultation, normal breath sounds, no adventitious sounds and normal vocal resonance. Inspection Chest Wall - Normal. Back - normal.  Breast Note: There is a very vague ill-defined fullness to the upper portion of the right breast but no distinct mass can be felt. There is no palpable mass of the left breast. There is no palpable axillary, supraclavicular, or cervical lymphadenopathy.   Cardiovascular Cardiovascular examination reveals -normal heart sounds, regular rate and rhythm with no murmurs and normal pedal pulses bilaterally.  Abdomen Inspection Inspection of the abdomen reveals - No Hernias. Skin - Scar - no surgical scars. Palpation/Percussion Palpation and Percussion of the abdomen reveal - Soft, Non Tender, No Rebound tenderness, No Rigidity (guarding) and No hepatosplenomegaly. Auscultation Auscultation of the abdomen reveals - Bowel sounds normal.  Neurologic Neurologic evaluation reveals -alert and oriented x 3 with no impairment of recent or remote memory. Mental Status-Normal.  Musculoskeletal Normal Exam - Left-Upper Extremity Strength Normal and Lower Extremity Strength Normal. Normal Exam - Right-Upper Extremity Strength Normal and Lower Extremity Strength Normal.  Lymphatic Head & Neck  General Head & Neck Lymphatics: Bilateral - Description - Normal. Axillary  General Axillary Region: Bilateral - Description - Normal. Tenderness - Non Tender. Femoral & Inguinal  Generalized Femoral & Inguinal Lymphatics:  Bilateral - Description - Normal. Tenderness - Non Tender.    Assessment & Plan  MALIGNANT NEOPLASM OF UPPER-OUTER QUADRANT OF RIGHT BREAST IN FEMALE, ESTROGEN RECEPTOR POSITIVE (C50.411) Impression: The patient originally had a 5.3 cm cancer in the upper outer quadrant of the right breast. She was treated with neoadjuvant hormones and has had significant shrinkage of the tumor. Her lymph nodes were normal looking. I have talked her today about the different options for treatment and at this point she favors breast conservation. I think this is a reasonable option for her given the shrinkage of the tumor.  She has chosen bilateral mastectomies and port placement.    

## 2019-03-11 NOTE — Progress Notes (Signed)
Attempted to call husband, no answer, no voicemail. Patient in rm [id number], receiving RN at bedside, call bell within reach.  Rowe Pavy, RN

## 2019-03-11 NOTE — Interval H&P Note (Signed)
History and Physical Interval Note:  03/11/2019 8:20 AM  Samantha Cross  has presented today for surgery, with the diagnosis of right breast cancer.  The various methods of treatment have been discussed with the patient and family. After consideration of risks, benefits and other options for treatment, the patient has consented to  Procedure(s): BILATERAL  MASTECTOMIES (Bilateral) INSERTION PORT-A-CATH POSSILBLE ULTRASOUND (N/A) as a surgical intervention.  The patient's history has been reviewed, patient examined, no change in status, stable for surgery.  I have reviewed the patient's chart and labs.  Questions were answered to the patient's satisfaction.     [name]

## 2019-03-11 NOTE — Progress Notes (Signed)
Pt vomitted x1 with yellow material approximately 100cc. Zofran was given at 1633 with minimal relief. Paged MD on call. Phenergan 12.5mg  PRN q6H ordered.   At 2100; pt stated she feels better and not nauseated anymore. Will hold off on phenergan. Pt instructed to call if she started to feel nausea again. Will continue to monitor.

## 2019-03-11 NOTE — Anesthesia Procedure Notes (Signed)
Anesthesia Regional Block: Pectoralis block   Pre-Anesthetic Checklist: ,, timeout performed, Correct Patient, Correct Site, Correct Laterality, Correct Procedure, Correct Position, site marked, Risks and benefits discussed, pre-op evaluation,  At surgeon's request and post-op pain management  Laterality: Left and Right  Prep: Maximum Sterile Barrier Precautions used, chloraprep       Needles:  Injection technique: Single-shot  Needle Type: Echogenic Stimulator Needle     Needle Length: 9cm  Needle Gauge: 21     Additional Needles:   Procedures:,,,, ultrasound used (permanent image in chart),,,,  Narrative:  Start time: 03/11/2019 8:00 AM End time: 03/11/2019 8:20 AM Injection made incrementally with aspirations every 5 mL. Anesthesiologist: Roderic Palau, MD  Additional Notes: 2% Lidocaine skin wheel. Bilateral PECS block.

## 2019-03-11 NOTE — Transfer of Care (Signed)
Immediate Anesthesia Transfer of Care Note  Patient: Samantha Cross [facility]  Procedure(s) Performed: BILATERAL  MASTECTOMIES (Bilateral Breast) INSERTION PORT-A-CATH POSSILBLE ULTRASOUND (Left Neck)  Patient Location: PACU  Anesthesia Type:GA combined with regional for post-op pain  Level of Consciousness: awake, alert  and oriented  Airway & Oxygen Therapy: Patient Spontanous Breathing and Patient connected to face mask oxygen  Post-op Assessment: Report given to RN and Post -op Vital signs reviewed and stable  Post vital signs: Reviewed and stable  Last Vitals:  Vitals Value Taken Time  BP 110/65 03/11/19 1227  Temp    Pulse 65 03/11/19 1229  Resp 29 03/11/19 1229  SpO2 96 % 03/11/19 1229  Vitals shown include unvalidated device data.  Last Pain:  Vitals:   03/11/19 0644  PainSc: 0-No pain         Complications: No apparent anesthesia complications

## 2019-03-11 NOTE — Anesthesia Procedure Notes (Signed)
Procedure Name: Intubation Date/Time: 03/11/2019 8:46 AM Performed by: Candis Shine, CRNA Pre-anesthesia Checklist: Patient identified, Emergency Drugs available, Suction available and Patient being monitored Patient Re-evaluated:Patient Re-evaluated prior to induction Oxygen Delivery Method: Circle System Utilized Preoxygenation: Pre-oxygenation with 100% oxygen Induction Type: IV induction and Rapid sequence Laryngoscope Size: Mac and 3 Grade View: Grade I Tube type: Oral Tube size: 7.0 mm Number of attempts: 1 Airway Equipment and Method: Stylet Placement Confirmation: ETT inserted through vocal cords under direct vision,  positive ETCO2 and breath sounds checked- equal and bilateral Secured at: 21 cm Tube secured with: Tape Dental Injury: Teeth and Oropharynx as per pre-operative assessment

## 2019-03-12 ENCOUNTER — Encounter (HOSPITAL_COMMUNITY): Payer: Self-pay | Admitting: General Surgery

## 2019-03-12 DIAGNOSIS — Z86010 Personal history of colonic polyps: Secondary | ICD-10-CM | POA: Diagnosis not present

## 2019-03-12 DIAGNOSIS — Z170 Estrogen receptor positive status [ER+]: Secondary | ICD-10-CM | POA: Diagnosis not present

## 2019-03-12 DIAGNOSIS — Z888 Allergy status to other drugs, medicaments and biological substances status: Secondary | ICD-10-CM | POA: Diagnosis not present

## 2019-03-12 DIAGNOSIS — C50411 Malignant neoplasm of upper-outer quadrant of right female breast: Secondary | ICD-10-CM | POA: Diagnosis not present

## 2019-03-12 DIAGNOSIS — Z90710 Acquired absence of both cervix and uterus: Secondary | ICD-10-CM | POA: Diagnosis not present

## 2019-03-12 DIAGNOSIS — Z79899 Other long term (current) drug therapy: Secondary | ICD-10-CM | POA: Diagnosis not present

## 2019-03-12 MED ORDER — HYDROCODONE-ACETAMINOPHEN 5-325 MG PO TABS: 1.0000 | ORAL_TABLET | Freq: Four times a day (QID) | ORAL | 0 refills | Status: DC | PRN

## 2019-03-12 MED ORDER — METHOCARBAMOL 500 MG PO TABS: 500.0000 mg | ORAL_TABLET | Freq: Four times a day (QID) | ORAL | 1 refills | Status: DC | PRN

## 2019-03-12 NOTE — Progress Notes (Signed)
1 Day Post-Op   Subjective/Chief Complaint: No complaints   Objective: Vital signs in last 24 hours: Temp:  [97.5 F (36.4 C)-98.7 F (37.1 C)] 98.7 F (37.1 C) (10/08 0431) Pulse Rate:  [52-71] 63 (10/08 0431) Resp:  [0-23] 16 (10/08 0431) BP: (92-138)/(53-75) 114/71 (10/08 0431) SpO2:  [91 %-100 %] 97 % (10/08 0431) Last BM Date: 03/10/19  Intake/Output from previous day: 10/07 0701 - 10/08 0700 In: 1322.8 [I.V.:1322.8] Out: 595 [Urine:300; Emesis/NG output:150; Drains:90; Blood:55] Intake/Output this shift: No intake/output data recorded.  General appearance: alert and cooperative Resp: clear to auscultation bilaterally Chest wall: skin flaps look good Cardio: regular rate and rhythm GI: soft, non-tender; bowel sounds normal; no masses,  no organomegaly  Lab Results:  No results for input(s): WBC, HGB, HCT, PLT in the last 72 hours. BMET No results for input(s): NA, K, CL, CO2, GLUCOSE, BUN, CREATININE, CALCIUM in the last 72 hours. PT/INR No results for input(s): LABPROT, INR in the last 72 hours. ABG No results for input(s): PHART, HCO3 in the last 72 hours.  Invalid input(s): PCO2, PO2  Studies/Results: Dg Chest Port 1 View  Result Date: 03/11/2019 CLINICAL DATA:  Postoperative, port catheter placement EXAM: PORTABLE CHEST 1 VIEW COMPARISON:  Port catheter placement FINDINGS: The heart size and mediastinal contours are within normal limits. Left chest port catheter, the full extent of the catheter tubing not completely imaged in the lower neck. Catheter tip positioned near the superior cavoatrial junction. Both lungs are clear. The visualized skeletal structures are unremarkable. Status post bilateral mastectomy with surgical drains and clips about the chest wall. IMPRESSION: 1. Left chest port catheter, the full extent of the catheter tubing not completely imaged in the lower neck. Catheter tip positioned near the superior cavoatrial junction. 2.  No acute  abnormality of the lungs. 3. Status post bilateral mastectomy with surgical drains and clips about the chest wall. Electronically Signed   By: Eddie Candle M.D.   On: 03/11/2019 13:23   Dg Fluoro Guide Cv Line-no Report  Result Date: 03/11/2019 Fluoroscopy was utilized by the requesting physician.  No radiographic interpretation.    Anti-infectives: Anti-infectives (From admission, onward)   Start     Dose/Rate Route Frequency Ordered Stop   03/11/19 0645  ceFAZolin (ANCEF) IVPB 2g/100 mL premix     2 g 200 mL/hr over 30 Minutes Intravenous On call to O.R. 03/11/19 0634 03/11/19 0853      Assessment/Plan: s/p Procedure(s): BILATERAL  MASTECTOMIES (Bilateral) INSERTION PORT-A-CATH POSSILBLE ULTRASOUND (Left) Advance diet Discharge teach pt drain care  LOS: 0 days    [name] 03/12/2019

## 2019-03-12 NOTE — Progress Notes (Signed)
Patient discharged to home. Verbalized understanding of all discharge instructions including incision care, discharge medications and follow up MD visits. 

## 2019-03-12 NOTE — Anesthesia Postprocedure Evaluation (Signed)
Anesthesia Post Note  Patient: Samantha Cross  Procedure(s) Performed: BILATERAL  MASTECTOMIES (Bilateral Breast) INSERTION PORT-A-CATH POSSILBLE ULTRASOUND (Left Neck)     Patient location during evaluation: Other Anesthesia Type: General and Regional Level of consciousness: awake and alert Pain management: pain level controlled Vital Signs Assessment: post-procedure vital signs reviewed and stable Respiratory status: spontaneous breathing, nonlabored ventilation and respiratory function stable Cardiovascular status: blood pressure returned to baseline and stable Postop Assessment: no apparent nausea or vomiting Anesthetic complications: no    Last Vitals:  Vitals:   03/12/19 0159 03/12/19 0431  BP: 128/69 114/71  Pulse: 61 63  Resp: 16 16  Temp: 37.1 C 37.1 C  SpO2: 98% 97%    Last Pain:  Vitals:   03/12/19 0431  TempSrc: Oral  PainSc:                  Parv Manthey,W. EDMOND

## 2019-03-12 NOTE — Discharge Summary (Signed)
Physician Discharge Summary  Patient ID: Samantha Cross MRN: [id number] DOB/AGE: 01-18-1946 73 y.o.  Admit date: 03/11/2019 Discharge date: 03/12/2019  Admission Diagnoses:  Discharge Diagnoses:  Active Problems:   Cancer of right female breast [facility])   Discharged Condition: good  Hospital Course: the pt underwent bilateral mastectomies. She tolerated surgery well. On pod 1 she was ready for d/c home  Consults: None  Significant Diagnostic Studies: none  Treatments: surgery: as above  Discharge Exam: Blood pressure 114/71, pulse 63, temperature 98.7 F (37.1 C), temperature source Oral, resp. rate 16, height 5\' 3"  (1.6 m), weight 66.9 kg, SpO2 97 %. General appearance: alert and cooperative Resp: clear to auscultation bilaterally Chest wall: skin flaps look good Cardio: regular rate and rhythm GI: soft, non-tender; bowel sounds normal; no masses,  no organomegaly  Disposition: Discharge disposition: 01-Home or Self Care       Discharge Instructions    Call MD for:  difficulty breathing, headache or visual disturbances   Complete by: As directed    Call MD for:  extreme fatigue   Complete by: As directed    Call MD for:  hives   Complete by: As directed    Call MD for:  persistant dizziness or light-headedness   Complete by: As directed    Call MD for:  persistant nausea and vomiting   Complete by: As directed    Call MD for:  redness, tenderness, or signs of infection (pain, swelling, redness, odor or green/yellow discharge around incision site)   Complete by: As directed    Call MD for:  severe uncontrolled pain   Complete by: As directed    Call MD for:  temperature >100.4   Complete by: As directed    Diet - low sodium heart healthy   Complete by: As directed    Discharge instructions   Complete by: As directed    Sponge bathe while drains are in. No overhead activity. Empty drains twice a day, record output, and recharge bulb. Wear binder most  of the time   Increase activity slowly   Complete by: As directed    No wound care   Complete by: As directed       Follow-up Information    [name], MD Follow up in 2 week(s).   Specialty: General Surgery Contact information: [address] [place] [id number] [phone]           Signed: [name] 03/12/2019, 8:22 AM

## 2019-03-16 ENCOUNTER — Encounter: Payer: Self-pay | Admitting: *Deleted

## 2019-03-16 LAB — SURGICAL PATHOLOGY

## 2019-03-18 ENCOUNTER — Telehealth: Payer: Self-pay | Admitting: *Deleted

## 2019-03-18 NOTE — Telephone Encounter (Signed)
03/18/2019 at 12:36pm - Research follow up call - The research nurse called the pt to see if she was interested in the UpBeat study.  The pt said that she has not read the consent yet.  The pt said that she is still working and is concerned about whether she will have enough time to participate in the trial.  The research nurse informed the pt that she must sign the consent form and complete all of the baseline assessments before her first chemo administration.  The pt said that she was still undecided at this point.  The research nurse agreed to meet with the pt at her chemo education class and discuss her study participation.  Brion Aliment RN, BSN, CCRP Clinical Research Nurse 03/18/2019 12:48 PM

## 2019-03-22 NOTE — Progress Notes (Signed)
Patient Care Team: [name], MD as PCP - General (Family Medicine)  DIAGNOSIS:    ICD-10-CM   1. Malignant neoplasm of upper-outer quadrant of right breast in female, estrogen receptor positive ([place])  C50.411    Z17.0     SUMMARY OF ONCOLOGIC HISTORY: Oncology History  Malignant neoplasm of upper-outer quadrant of right breast in female, estrogen receptor positive ([place])  04/23/2018 Initial Diagnosis   Palpable lump in right breast UOQ, mammogram revealed 2 separate masses.  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  The Multi-Gene Panel offered by [facility] includes sequencing and/or deletion duplication testing of the following 85 genes: AIP, ALK, APC, ATM, AXIN2,BAP1,  BARD1, BLM, BMPR1A, BRCA1, BRCA2, BRIP1, CASR, CDC73, CDH1, CDK4, CDKN1B, CDKN1C, CDKN2A (p14ARF), CDKN2A (p16INK4a), CEBPA, CHEK2, CTNNA1, DICER1, DIS3L2, EGFR (c.2369C>T, p.Thr790Met variant only), EPCAM (Deletion/duplication testing only), FH, FLCN, GATA2, GPC3, GREM1 (Promoter region deletion/duplication testing only), HOXB13 (c.251G>A, p.Gly84Glu), HRAS, KIT, MAX, MEN1, MET, MITF (c.952G>A, p.Glu318Lys variant only), MLH1, MSH2, MSH3, MSH6, MUTYH, NBN, NF1, NF2, NTHL1, PALB2, PDGFRA, PHOX2B, PMS2, POLD1, POLE, POT1, PRKAR1A, PTCH1, PTEN, RAD50, RAD51C, RAD51D, RB1, RECQL4, RET, RNF43, RUNX1, SDHAF2, SDHA (sequence changes only), SDHB, SDHC, SDHD, SMAD4, SMARCA4, SMARCB1, SMARCE1, STK11,  SUFU, TERC, TERT, TMEM127, TP53, TSC1, TSC2, VHL, WRN and WT1.      12/24/2018 Surgery   Right lumpectomy [name]): residual invasive carcinoma, clear margins, and 2/3 lymph nodes positive for carcinoma.    01/14/2019 Cancer Staging   Staging form: Breast, AJCC 8th Edition - Pathologic stage from 01/14/2019: No Stage Recommended (ypT2, pN1a, cM0, G2, ER+, PR-, HER2-) - Signed by [name], MD on 01/14/2019   01/22/2019 Miscellaneous   MammaPrint: High risk luminal type B   03/11/2019 Surgery   Bilateral mastectomies [name]):   Left breast: benign tissue and two negative lymph nodes Right breast: residual IDC 0.5 cm, clear margins, 2 lymph nodes negativeER 95%, PR 0%, HER-2 -1+, Ki-67 10%     CHIEF COMPLIANT: Follow-up s/p bilateral mastectomies to review pathology  INTERVAL HISTORY: Samantha Cross is a 73 y.o. with above-mentioned history of right breast cancer treated with neoadjuvant letrozole, lumpectomy, and for which Mammaprint testing revealed she was high risk. She underwent bilateral mastectomies on 03/11/19 with Dr. [name] for which pathology revealed in the left breast, benign tissue and two negative lymph nodes, and in the right breast, residual invasive ductal carcinoma, clear margins, 2 lymph nodes negative for carcinoma. She presents to the clinic today to review the pathology report and discuss further treatment.   REVIEW OF SYSTEMS:   Constitutional: Denies fevers, chills or abnormal weight loss Eyes: Denies blurriness of vision Ears, nose, mouth, throat, and face: Denies mucositis or sore throat Respiratory: Denies cough, dyspnea or wheezes Cardiovascular: Denies palpitation, chest discomfort Gastrointestinal: Denies nausea, heartburn or change in bowel habits Skin: Denies abnormal skin rashes Lymphatics: Denies new lymphadenopathy or easy bruising Neurological: Denies numbness, tingling or new  weaknesses Behavioral/Psych: Mood is stable, no new changes    Extremities: No lower extremity edema Breast: s/p bilateral mastectomies All other systems were reviewed with the patient and are negative.  I have reviewed the past medical history, past surgical history, social history and family history with the patient and they are unchanged from previous note.  ALLERGIES:  is allergic to lisinopril; alendronate [alendronate]; and hydroxyzine.  MEDICATIONS:  Current Outpatient Medications  Medication Sig Dispense Refill   amLODipine (NORVASC) 10 MG tablet TAKE 1 TABLET(10 MG) BY MOUTH DAILY 90 tablet 1   CLOBETASOL PROPIONATE E 0.05 % emollient cream APPLY EXTERNALLY TO THE AFFECTED AREA TWICE DAILY (Patient taking differently: Apply 1 application topically 2 (two) times daily as needed (rash). ) 45 g prn   HYDROcodone-acetaminophen (NORCO/VICODIN) 5-325 MG tablet Take 1-2 tablets by mouth every 6 (six) hours as needed for moderate pain. 15 tablet 0   letrozole (FEMARA) 2.5 MG tablet Take 1 tablet (2.5 mg total) by mouth daily. 90 tablet 0   methocarbamol (ROBAXIN) 500 MG tablet Take 1 tablet (500 mg total) by mouth every 6 (six) hours as needed for muscle spasms. 20 tablet 1   Misc Natural Products (ESSIAC TONIC) CAPS Take 1-2 capsules by mouth 2 (two) times daily. Vegetable     OVER THE COUNTER MEDICATION Take 2 capsules by mouth 2 (two) times daily as needed (Supplement). [employer]     simvastatin (ZOCOR) 20 MG tablet TAKE 1 TABLET BY MOUTH EVERY NIGHT AT BEDTIME 90 tablet 3   No current facility-administered medications for this visit.     PHYSICAL EXAMINATION: ECOG PERFORMANCE STATUS: 1 - Symptomatic but completely ambulatory  Vitals:   03/23/19 1010  BP: (!) 143/89  Pulse: 72  Resp: 18  Temp: 98.7 F (37.1 C)  SpO2: 100%   Filed Weights   03/23/19 1010  Weight: 140 lb 12.8 oz (63.9 kg)    GENERAL: alert, no distress and comfortable SKIN: skin color, texture, turgor are normal, no rashes or significant lesions EYES:  normal, Conjunctiva are pink and non-injected, sclera clear OROPHARYNX: no exudate, no erythema and lips, buccal mucosa, and tongue normal  NECK: supple, thyroid normal size, non-tender, without nodularity LYMPH: no palpable lymphadenopathy in the cervical, axillary or inguinal LUNGS: clear to auscultation and percussion with normal breathing effort HEART: regular rate & rhythm and no murmurs and no lower extremity edema ABDOMEN: abdomen soft, non-tender and normal bowel sounds MUSCULOSKELETAL: no cyanosis of digits and no clubbing  NEURO: alert & oriented x 3 with fluent speech, no focal motor/sensory deficits EXTREMITIES: No lower extremity edema  LABORATORY DATA:  I have reviewed the data as listed CMP Latest Ref Rng & Units 03/03/2019 04/09/2018 10/09/2016  Glucose 70 - 99 mg/dL 103(H) 103(H) 95  BUN 8 - 23 mg/dL '8 13 12  ' Creatinine 0.44 - 1.00 mg/dL 0.75 0.78 0.72  Sodium 135 - 145 mmol/L 140 144 143  Potassium 3.5 - 5.1 mmol/L 3.7 4.1 4.2  Chloride 98 - 111 mmol/L 104 107 105  CO2 22 - 32 mmol/L '27 28 22  ' Calcium 8.9 - 10.3 mg/dL 9.4 9.8 9.8  Total Protein 6.1 - 8.1 g/dL - 7.3 7.8  Total Bilirubin 0.2 - 1.2 mg/dL - 0.9 0.8  Alkaline Phos 33 - 130 U/L - - 80  AST 10 - 35 U/L - 14 17  ALT 6 - 29 U/L - 15 15    Lab Results  Component Value Date   WBC  5.2 03/03/2019   HGB 16.0 (H) 03/03/2019   HCT 48.3 (H) 03/03/2019   MCV 93.6 03/03/2019   PLT 254 03/03/2019   NEUTROABS 2.8 04/15/2013    ASSESSMENT & PLAN:  Malignant neoplasm of upper-outer quadrant of right breast in female, estrogen receptor positive ([place]) 04/23/2018 palpable lump in right breast UOQ, mammogram revealed 2 separate masses.  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, ER 95%, PR 0%, Ki-67 10%, HER-2 negative IHC 1+ T3 N0 stage 3A  MRI breast 05/19/2018: 5.7 x 4.9 x 3.3 cm mass central right  breast, no lymph nodes 05/22/2018: CT CAP: Right renal lesion 06/06/2017: MRI abdomen: Complex cystic lesion right kidney, probably benign but requires follow-up in 6 months  Right lumpectomy: Residual invasive ductal carcinoma 3.8 cm, margins negative, 2/3 lymph nodes positive, ER 95%, PR 0%, HER-2 negative, Ki-67 10%  Treatment plan: 1.Neoadjuvant antiestrogen therapy12/02/2018-July 2020 3.12/24/2018 Right lumpectomy: Residual invasive ductal carcinoma 3.8 cm, margins negative, 2/3 lymph nodes positive, ER 95%, PR 0%, HER-2 negative, Ki-67 10% 4.MammaPrint high risk: Adjuvant chemotherapy is being recommended with dose dense Adriamycin and Cytoxan followed by Taxol  5. adjuvant radiation therapy 5.Followed by adjuvant antiestrogen therapy -----------------------------------------------------------------------------------------------------------------------------------------------  03/11/2019 bilateral mastectomies [name]):   Left breast: benign tissue and two negative lymph nodes Right breast: residual IDC 0.5 cm , clear margins, 2 lymph nodes negative, ER 95%, PR 0%, HER-2 -1+, Ki-67 10%  Treatment plan: Adjuvant chemotherapy with dose dense Adriamycin and Cytoxan x4 followed by Taxol weekly x12 clinical trial SWOG 1714  Plan to start chemotherapy in 2 weeks.    No orders of the defined types were placed in this encounter.  The patient has a good understanding of the overall plan. she agrees with it. she will call with any problems that may develop before the next visit here.  [name], MD 03/23/2019  Julious Oka Dorshimer am acting as scribe for Dr. [name].  I have reviewed the above documentation for accuracy and completeness, and I agree with the above.

## 2019-03-23 ENCOUNTER — Telehealth: Payer: Self-pay | Admitting: Hematology and Oncology

## 2019-03-23 ENCOUNTER — Inpatient Hospital Stay: Payer: Medicare Other | Attending: Hematology and Oncology | Admitting: Hematology and Oncology

## 2019-03-23 ENCOUNTER — Other Ambulatory Visit: Payer: Self-pay | Admitting: Hematology and Oncology

## 2019-03-23 ENCOUNTER — Other Ambulatory Visit: Payer: Self-pay

## 2019-03-23 DIAGNOSIS — Z170 Estrogen receptor positive status [ER+]: Secondary | ICD-10-CM | POA: Diagnosis not present

## 2019-03-23 DIAGNOSIS — C50411 Malignant neoplasm of upper-outer quadrant of right female breast: Secondary | ICD-10-CM

## 2019-03-23 DIAGNOSIS — Z9013 Acquired absence of bilateral breasts and nipples: Secondary | ICD-10-CM | POA: Diagnosis not present

## 2019-03-23 DIAGNOSIS — Z79811 Long term (current) use of aromatase inhibitors: Secondary | ICD-10-CM | POA: Insufficient documentation

## 2019-03-23 MED ORDER — ONDANSETRON HCL 8 MG PO TABS: 8.0000 mg | ORAL_TABLET | Freq: Two times a day (BID) | ORAL | 1 refills | Status: DC | PRN

## 2019-03-23 MED ORDER — DEXAMETHASONE 4 MG PO TABS: ORAL_TABLET | ORAL | 1 refills | Status: DC

## 2019-03-23 MED ORDER — LORAZEPAM 0.5 MG PO TABS: 0.5000 mg | ORAL_TABLET | Freq: Four times a day (QID) | ORAL | 0 refills | Status: DC | PRN

## 2019-03-23 MED ORDER — PROCHLORPERAZINE MALEATE 10 MG PO TABS: 10.0000 mg | ORAL_TABLET | Freq: Four times a day (QID) | ORAL | 1 refills | Status: DC | PRN

## 2019-03-23 MED ORDER — LIDOCAINE-PRILOCAINE 2.5-2.5 % EX CREA: TOPICAL_CREAM | CUTANEOUS | 3 refills | Status: DC

## 2019-03-23 NOTE — Telephone Encounter (Signed)
Confirmed with patient appointments from October thru December. Per staff message  from desk nurse start patient treatment Tuesday instead of Thursday.

## 2019-03-23 NOTE — Assessment & Plan Note (Signed)
04/23/2018 palpable lump in right breast UOQ, mammogram revealed 2 separate masses.  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, margins negative, 2/3 lymph nodes positive, ER 95%, PR 0%, HER-2 negative, Ki-67 10%  Treatment plan: 1.Neoadjuvant antiestrogen therapy12/02/2018-July 2020 3.12/24/2018 Right lumpectomy: Residual invasive ductal carcinoma 3.8 cm, margins negative, 2/3 lymph nodes positive, ER 95%, PR 0%, HER-2 negative, Ki-67 10% 4.MammaPrint high risk: Adjuvant chemotherapy is being recommended with dose dense Adriamycin and Cytoxan followed by Taxol  5. adjuvant radiation therapy 5.Followed by adjuvant antiestrogen therapy -----------------------------------------------------------------------------------------------------------------------------------------------  03/11/2019 bilateral mastectomies [name]):   Left breast: benign tissue and two negative lymph nodes Right breast: residual ID 0.5 cm C, clear margins, 2 lymph nodes negative, ER 95%, PR 0%, HER-2 -1+, Ki-67 10%  Treatment plan: Adjuvant chemotherapy with dose dense Adriamycin and Cytoxan x4 followed by Taxol weekly x12 clinical trial SWOG 1714  Plan to start chemotherapy in 2 weeks.

## 2019-03-24 ENCOUNTER — Encounter: Payer: Self-pay | Admitting: *Deleted

## 2019-03-26 ENCOUNTER — Encounter: Payer: Self-pay | Admitting: *Deleted

## 2019-03-26 NOTE — Progress Notes (Signed)
03/26/19 at 5:08pm - The research nurse called the pt to see if she wanted to meet on 03/30/19 after her chemo education class to discuss the UPBEAT study.  The pt was told that she must consent and enroll on this study before her first chemo on 04/14/19.  The pt was told that it would take 3-4 hrs to get all of the assessments done for the study.  The research nurse offered to break up the assessments to lessen the pt's time off of work.  The pt said that with all of the chemo appts that she has upcoming that she does not want to take anymore time off of work to enroll on this study.  The pt was thanked for her consideration of this trial.   Brion Aliment RN, BSN, CCRP Clinical Research Nurse 03/26/2019 5:11 PM

## 2019-03-27 ENCOUNTER — Other Ambulatory Visit: Payer: Self-pay | Admitting: Family Medicine

## 2019-03-30 ENCOUNTER — Inpatient Hospital Stay: Payer: Medicare Other

## 2019-03-30 ENCOUNTER — Telehealth: Payer: Self-pay | Admitting: *Deleted

## 2019-03-30 ENCOUNTER — Other Ambulatory Visit: Payer: Self-pay

## 2019-03-30 NOTE — Telephone Encounter (Signed)
Received message from nurse stating patient's husband had some questions about if patient is really HER2 +.  Her husband thought he had heard that she was HER2+.  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  She verbalized understanding.

## 2019-04-13 NOTE — Progress Notes (Signed)
Patient Care Team: [name], MD as PCP - General (Family Medicine)  DIAGNOSIS:    ICD-10-CM   1. Malignant neoplasm of upper-outer quadrant of right breast in female, estrogen receptor positive ([place])  C50.411    Z17.0     SUMMARY OF ONCOLOGIC HISTORY: Oncology History  Malignant neoplasm of upper-outer quadrant of right breast in female, estrogen receptor positive ([place])  04/23/2018 Initial Diagnosis   Palpable lump in right breast UOQ, mammogram revealed 2 separate masses.  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  The Multi-Gene Panel offered by [facility] includes sequencing and/or deletion duplication testing of the following 85 genes: AIP, ALK, APC, ATM, AXIN2,BAP1,  BARD1, BLM, BMPR1A, BRCA1, BRCA2, BRIP1, CASR, CDC73, CDH1, CDK4, CDKN1B, CDKN1C, CDKN2A (p14ARF), CDKN2A (p16INK4a), CEBPA, CHEK2, CTNNA1, DICER1, DIS3L2, EGFR (c.2369C>T, p.Thr790Met variant only), EPCAM (Deletion/duplication testing only), FH, FLCN, GATA2, GPC3, GREM1 (Promoter region deletion/duplication testing only), HOXB13 (c.251G>A, p.Gly84Glu), HRAS, KIT, MAX, MEN1, MET, MITF (c.952G>A, p.Glu318Lys variant only), MLH1, MSH2, MSH3, MSH6, MUTYH, NBN, NF1, NF2, NTHL1, PALB2, PDGFRA, PHOX2B, PMS2, POLD1, POLE, POT1, PRKAR1A, PTCH1, PTEN, RAD50, RAD51C, RAD51D, RB1, RECQL4, RET, RNF43, RUNX1, SDHAF2, SDHA (sequence changes only), SDHB, SDHC, SDHD, SMAD4, SMARCA4, SMARCB1, SMARCE1, STK11,  SUFU, TERC, TERT, TMEM127, TP53, TSC1, TSC2, VHL, WRN and WT1.      12/24/2018 Surgery   Right lumpectomy [name]): residual invasive carcinoma, clear margins, and 2/3 lymph nodes positive for carcinoma.    01/14/2019 Cancer Staging   Staging form: Breast, AJCC 8th Edition - Pathologic stage from 01/14/2019: No Stage Recommended (ypT2, pN1a, cM0, G2, ER+, PR-, HER2-) - Signed by [name], MD on 01/14/2019   01/22/2019 Miscellaneous   MammaPrint: High risk luminal type B   03/11/2019 Surgery   Bilateral mastectomies [name]):   Left breast: benign tissue and two negative lymph nodes Right breast: residual IDC 0.5 cm, clear margins, 2 lymph nodes negativeER 95%, PR 0%, HER-2 -1+, Ki-67 10%   04/14/2019 -  Chemotherapy   The patient had DOXOrubicin (ADRIAMYCIN) chemo injection 102 mg, 60 mg/m2 = 102 mg, Intravenous,  Once, 0 of 4 cycles palonosetron (ALOXI) injection 0.25 mg, 0.25 mg, Intravenous,  Once, 0 of 4 cycles pegfilgrastim-jmdb (FULPHILA) injection 6 mg, 6 mg, Subcutaneous,  Once, 0 of 4 cycles cyclophosphamide (CYTOXAN) 1,020 mg in sodium chloride 0.9 % 250 mL chemo infusion, 600 mg/m2 = 1,020 mg, Intravenous,  Once, 0 of 4 cycles PACLitaxel (TAXOL) 138 mg in sodium chloride 0.9 % 250 mL chemo infusion (</= 8m/m2), 80 mg/m2 = 138 mg, Intravenous,  Once, 0 of 12 cycles fosaprepitant (EMEND) 150 mg, dexamethasone (DECADRON) 12 mg in sodium chloride 0.9 % 145 mL IVPB, , Intravenous,  Once, 0 of 4 cycles  for chemotherapy treatment.      CHIEF COMPLIANT: Cycle 1 Adriamycin and Cytoxan   INTERVAL HISTORY: DWyolene Cross a 73y.o. with above-mentioned history of right breast cancer treated with neoadjuvant  letrozole, lumpectomy, and for which Mammaprint testing revealed she was high risk. She underwent bilateral mastectomies and is currently on adjuvant chemotherapy with dose dense Adriamycin and Cytoxan. She presents to the clinic today for cycle 1.   REVIEW OF SYSTEMS:    Constitutional: Denies fevers, chills or abnormal weight loss Eyes: Denies blurriness of vision Ears, nose, mouth, throat, and face: Denies mucositis or sore throat Respiratory: Denies cough, dyspnea or wheezes Cardiovascular: Denies palpitation, chest discomfort Gastrointestinal: Denies nausea, heartburn or change in bowel habits Skin: Denies abnormal skin rashes Lymphatics: Denies new lymphadenopathy or easy bruising Neurological: Denies numbness, tingling or new weaknesses Behavioral/Psych: Mood is stable, no new changes  Extremities: No lower extremity edema Breast: denies any pain or lumps or nodules in either breasts All other systems were reviewed with the patient and are negative.  I have reviewed the past medical history, past surgical history, social history and family history with the patient and they are unchanged from previous note.  ALLERGIES:  is allergic to lisinopril; alendronate [alendronate]; and hydroxyzine.  MEDICATIONS:  Current Outpatient Medications  Medication Sig Dispense Refill   amLODipine (NORVASC) 10 MG tablet TAKE 1 TABLET(10 MG) BY MOUTH DAILY 90 tablet 1   CLOBETASOL PROPIONATE E 0.05 % emollient cream APPLY EXTERNALLY TO THE AFFECTED AREA TWICE DAILY (Patient taking differently: Apply 1 application topically 2 (two) times daily as needed (rash). ) 45 g prn   dexamethasone (DECADRON) 4 MG tablet Take 1 tablet day after chemo and 1 tablet 2 days after chemo with food 30 tablet 1   HYDROcodone-acetaminophen (NORCO/VICODIN) 5-325 MG tablet Take 1-2 tablets by mouth every 6 (six) hours as needed for moderate pain. 15 tablet 0   letrozole (FEMARA) 2.5 MG tablet Take 1 tablet (2.5 mg total) by mouth daily. 90 tablet 0   lidocaine-prilocaine (EMLA) cream Apply to affected area once 30 g 3   LORazepam (ATIVAN) 0.5 MG tablet Take 1 tablet (0.5 mg total) by mouth every 6 (six) hours as needed (Nausea or vomiting). 30 tablet 0   methocarbamol (ROBAXIN) 500  MG tablet Take 1 tablet (500 mg total) by mouth every 6 (six) hours as needed for muscle spasms. 20 tablet 1   Misc Natural Products (ESSIAC TONIC) CAPS Take 1-2 capsules by mouth 2 (two) times daily. Vegetable     ondansetron (ZOFRAN) 8 MG tablet Take 1 tablet (8 mg total) by mouth 2 (two) times daily as needed. Start on the third day after chemotherapy. 30 tablet 1   OVER THE COUNTER MEDICATION Take 2 capsules by mouth 2 (two) times daily as needed (Supplement). [employer]     prochlorperazine (COMPAZINE) 10 MG tablet Take 1 tablet (10 mg total) by mouth every 6 (six) hours as needed (Nausea or vomiting). 30 tablet 1   simvastatin (ZOCOR) 20 MG tablet TAKE 1 TABLET BY MOUTH EVERY NIGHT AT BEDTIME 90 tablet 0   No current facility-administered medications for this visit.     PHYSICAL EXAMINATION: ECOG PERFORMANCE STATUS: 1 - Symptomatic but completely ambulatory  Vitals:   04/14/19 1102  BP: (!) 142/79  Pulse: 64  Resp: 16  Temp: 98.3 F (36.8 C)  SpO2: 100%   Filed Weights   04/14/19 1102  Weight: 140 lb 9.6 oz (63.8 kg)    GENERAL: alert, no distress and comfortable SKIN: skin color, texture, turgor are normal, no rashes or significant lesions EYES: normal, Conjunctiva are pink and non-injected, sclera clear OROPHARYNX: no exudate, no erythema and  lips, buccal mucosa, and tongue normal  NECK: supple, thyroid normal size, non-tender, without nodularity LYMPH: no palpable lymphadenopathy in the cervical, axillary or inguinal LUNGS: clear to auscultation and percussion with normal breathing effort HEART: regular rate & rhythm and no murmurs and no lower extremity edema ABDOMEN: abdomen soft, non-tender and normal bowel sounds MUSCULOSKELETAL: no cyanosis of digits and no clubbing  NEURO: alert & oriented x 3 with fluent speech, no focal motor/sensory deficits EXTREMITIES: No lower extremity edema  LABORATORY DATA:  I have reviewed the data as listed CMP Latest Ref  Rng & Units 03/03/2019 04/09/2018 10/09/2016  Glucose 70 - 99 mg/dL 103(H) 103(H) 95  BUN 8 - 23 mg/dL '8 13 12  ' Creatinine 0.44 - 1.00 mg/dL 0.75 0.78 0.72  Sodium 135 - 145 mmol/L 140 144 143  Potassium 3.5 - 5.1 mmol/L 3.7 4.1 4.2  Chloride 98 - 111 mmol/L 104 107 105  CO2 22 - 32 mmol/L '27 28 22  ' Calcium 8.9 - 10.3 mg/dL 9.4 9.8 9.8  Total Protein 6.1 - 8.1 g/dL - 7.3 7.8  Total Bilirubin 0.2 - 1.2 mg/dL - 0.9 0.8  Alkaline Phos 33 - 130 U/L - - 80  AST 10 - 35 U/L - 14 17  ALT 6 - 29 U/L - 15 15    Lab Results  Component Value Date   WBC 7.2 04/14/2019   HGB 15.0 04/14/2019   HCT 45.9 04/14/2019   MCV 90.0 04/14/2019   PLT 230 04/14/2019   NEUTROABS 4.3 04/14/2019    ASSESSMENT & PLAN:  Malignant neoplasm of upper-outer quadrant of right breast in female, estrogen receptor positive ([place]) 04/23/2018 palpable lump in right breast UOQ, mammogram revealed 2 separate masses.  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, margins negative, 2/3 lymph nodes positive, ER 95%, PR 0%, HER-2 negative, Ki-67 10%  Treatment plan: 1.Neoadjuvant antiestrogen therapy12/02/2018-July 2020 3.12/24/2018 Right lumpectomy: Residual invasive ductal carcinoma 3.8 cm, margins negative, 2/3 lymph nodes positive, ER 95%, PR 0%, HER-2 negative, Ki-67 10% 4.MammaPrint high risk: Adjuvant chemotherapy is being recommended with dose dense Adriamycin and Cytoxan followed by Taxol 5.adjuvant radiation therapy 5.Followed by adjuvant  antiestrogen therapy ----------------------------------------------------------------------------------------------------------------------------------------------- 03/11/2019 bilateral mastectomies [name]):   Left breast: benign tissue and two negative lymph nodes Right breast: residual IDC 0.5 cm , clear margins, 2 lymph nodes negative, ER 95%, PR 0%, HER-2 -1+, Ki-67 10%  Treatment plan: Adjuvant chemotherapy with dose dense Adriamycin and Cytoxan x4 followed by Taxol weekly x12 clinical trialSWOG [id number] ---------------------------------------------------------------------------------------------------------------------------------- Current Treatment: Cycle 1 day 1 dose dense Adriamycin and Cytoxan Labs reviewed Chemo consent obtained chemo education completed Echocardiogram 02/12/2019: EF 55 to 60% Return to clinic in 1 week for toxicity check    No orders of the defined types were placed in this encounter.  The patient has a good understanding of the overall plan. she agrees with it. she will call with any problems that may develop before the next visit here.  [name], MD 04/14/2019  Julious Oka Dorshimer am  acting as scribe for Dr. [name].  I have reviewed the above documentation for accuracy and completeness, and I agree with the above.

## 2019-04-14 ENCOUNTER — Inpatient Hospital Stay: Payer: Medicare Other

## 2019-04-14 ENCOUNTER — Encounter: Payer: Self-pay | Admitting: Hematology and Oncology

## 2019-04-14 ENCOUNTER — Inpatient Hospital Stay: Payer: Medicare Other | Attending: Hematology and Oncology | Admitting: Hematology and Oncology

## 2019-04-14 ENCOUNTER — Other Ambulatory Visit: Payer: Self-pay

## 2019-04-14 DIAGNOSIS — Z170 Estrogen receptor positive status [ER+]: Secondary | ICD-10-CM | POA: Diagnosis not present

## 2019-04-14 DIAGNOSIS — Z9013 Acquired absence of bilateral breasts and nipples: Secondary | ICD-10-CM | POA: Insufficient documentation

## 2019-04-14 DIAGNOSIS — C50411 Malignant neoplasm of upper-outer quadrant of right female breast: Secondary | ICD-10-CM | POA: Insufficient documentation

## 2019-04-14 DIAGNOSIS — Z5111 Encounter for antineoplastic chemotherapy: Secondary | ICD-10-CM | POA: Diagnosis not present

## 2019-04-14 DIAGNOSIS — Z79811 Long term (current) use of aromatase inhibitors: Secondary | ICD-10-CM | POA: Diagnosis not present

## 2019-04-14 DIAGNOSIS — Z923 Personal history of irradiation: Secondary | ICD-10-CM | POA: Diagnosis not present

## 2019-04-14 DIAGNOSIS — Z95828 Presence of other vascular implants and grafts: Secondary | ICD-10-CM

## 2019-04-14 LAB — CBC WITH DIFFERENTIAL (CANCER CENTER ONLY)
Abs Immature Granulocytes: 0.02 10*3/uL (ref 0.00–0.07)
Basophils Absolute: 0 10*3/uL (ref 0.0–0.1)
Basophils Relative: 1 %
Eosinophils Absolute: 0.2 10*3/uL (ref 0.0–0.5)
Eosinophils Relative: 2 %
HCT: 45.9 % (ref 36.0–46.0)
Hemoglobin: 15 g/dL (ref 12.0–15.0)
Immature Granulocytes: 0 %
Lymphocytes Relative: 29 %
Lymphs Abs: 2.1 10*3/uL (ref 0.7–4.0)
MCH: 29.4 pg (ref 26.0–34.0)
MCHC: 32.7 g/dL (ref 30.0–36.0)
MCV: 90 fL (ref 80.0–100.0)
Monocytes Absolute: 0.6 10*3/uL (ref 0.1–1.0)
Monocytes Relative: 9 %
Neutro Abs: 4.3 10*3/uL (ref 1.7–7.7)
Neutrophils Relative %: 59 %
Platelet Count: 230 10*3/uL (ref 150–400)
RBC: 5.1 MIL/uL (ref 3.87–5.11)
RDW: 12.5 % (ref 11.5–15.5)
WBC Count: 7.2 10*3/uL (ref 4.0–10.5)
nRBC: 0 % (ref 0.0–0.2)

## 2019-04-14 LAB — CMP (CANCER CENTER ONLY)
ALT: 14 U/L (ref 0–44)
AST: 17 U/L (ref 15–41)
Albumin: 4.2 g/dL (ref 3.5–5.0)
Alkaline Phosphatase: 85 U/L (ref 38–126)
Anion gap: 11 (ref 5–15)
BUN: 8 mg/dL (ref 8–23)
CO2: 23 mmol/L (ref 22–32)
Calcium: 9.1 mg/dL (ref 8.9–10.3)
Chloride: 109 mmol/L (ref 98–111)
Creatinine: 0.73 mg/dL (ref 0.44–1.00)
GFR, Est AFR Am: >60 mL/min (ref >60)
GFR, Estimated: >60 mL/min (ref >60)
Glucose, Bld: 95 mg/dL (ref 70–99)
Potassium: 4 mmol/L (ref 3.5–5.1)
Sodium: 143 mmol/L (ref 135–145)
Total Bilirubin: 0.9 mg/dL (ref 0.3–1.2)
Total Protein: 7.6 g/dL (ref 6.5–8.1)

## 2019-04-14 MED ORDER — SODIUM CHLORIDE 0.9 % IV SOLN
600.0000 mg/m2 | Freq: Once | INTRAVENOUS | Status: AC
  Administered 2019-04-14: 1020 mg via INTRAVENOUS
  Filled 2019-04-14: qty 51

## 2019-04-14 MED ORDER — PALONOSETRON HCL INJECTION 0.25 MG/5ML
0.2500 mg | Freq: Once | INTRAVENOUS | Status: AC
  Administered 2019-04-14: 0.25 mg via INTRAVENOUS

## 2019-04-14 MED ORDER — SODIUM CHLORIDE 0.9% FLUSH
10.0000 mL | INTRAVENOUS | Status: DC | PRN
  Administered 2019-04-14: 10 mL via INTRAVENOUS
  Filled 2019-04-14: qty 10

## 2019-04-14 MED ORDER — PALONOSETRON HCL INJECTION 0.25 MG/5ML
INTRAVENOUS | Status: AC
  Filled 2019-04-14: qty 5

## 2019-04-14 MED ORDER — DOXORUBICIN HCL CHEMO IV INJECTION 2 MG/ML
60.0000 mg/m2 | Freq: Once | INTRAVENOUS | Status: AC
  Administered 2019-04-14: 102 mg via INTRAVENOUS
  Filled 2019-04-14: qty 51

## 2019-04-14 MED ORDER — SODIUM CHLORIDE 0.9 % IV SOLN
Freq: Once | INTRAVENOUS | Status: AC
  Administered 2019-04-14: 12:00:00 via INTRAVENOUS
  Filled 2019-04-14: qty 5

## 2019-04-14 MED ORDER — SODIUM CHLORIDE 0.9% FLUSH
10.0000 mL | INTRAVENOUS | Status: DC | PRN
  Administered 2019-04-14: 10 mL
  Filled 2019-04-14: qty 10

## 2019-04-14 MED ORDER — HEPARIN SOD (PORK) LOCK FLUSH 100 UNIT/ML IV SOLN
500.0000 [IU] | Freq: Once | INTRAVENOUS | Status: AC | PRN
  Administered 2019-04-14: 500 [IU]
  Filled 2019-04-14: qty 5

## 2019-04-14 MED ORDER — SODIUM CHLORIDE 0.9 % IV SOLN
Freq: Once | INTRAVENOUS | Status: AC
  Administered 2019-04-14: 12:00:00 via INTRAVENOUS
  Filled 2019-04-14: qty 250

## 2019-04-14 NOTE — Progress Notes (Signed)
Met with patient at registration to introduce myself as Financial Resource Specialist and to offer available resources. ° °Patient has 2 insurances therefore copay assistance is not needed. ° °Discussed one-time $1000 [employer] grant and qualifications to assist with personal expenses while going through treatment.   ° °Gave her my card if interested in applying and for any additional financial questions or concerns. °

## 2019-04-14 NOTE — Patient Instructions (Signed)
[facility] Discharge Instructions for Patients Receiving Chemotherapy  Today you received the following chemotherapy agents Adriamycin and Cytoxan.  To help prevent nausea and vomiting after your treatment, we encourage you to take your nausea medication as directed.  If you develop nausea and vomiting that is not controlled by your nausea medication, call the clinic.   BELOW ARE SYMPTOMS THAT SHOULD BE REPORTED IMMEDIATELY:  *FEVER GREATER THAN 100.5 F  *CHILLS WITH OR WITHOUT FEVER  NAUSEA AND VOMITING THAT IS NOT CONTROLLED WITH YOUR NAUSEA MEDICATION  *UNUSUAL SHORTNESS OF BREATH  *UNUSUAL BRUISING OR BLEEDING  TENDERNESS IN MOUTH AND THROAT WITH OR WITHOUT PRESENCE OF ULCERS  *URINARY PROBLEMS  *BOWEL PROBLEMS  UNUSUAL RASH Items with * indicate a potential emergency and should be followed up as soon as possible.  Feel free to call the clinic should you have any questions or concerns. The clinic phone number is [phone].  Please show the CHEMO ALERT CARD at check-in to the Emergency Department and triage nurse.  Doxorubicin injection What is this medicine? DOXORUBICIN (dox oh ROO bi sin) is a chemotherapy drug. It is used to treat many kinds of cancer like leukemia, lymphoma, neuroblastoma, sarcoma, and Wilms' tumor. It is also used to treat bladder cancer, breast cancer, lung cancer, ovarian cancer, stomach cancer, and thyroid cancer. This medicine may be used for other purposes; ask your health care provider or pharmacist if you have questions. COMMON BRAND NAME(S): Adriamycin, Adriamycin PFS, Adriamycin RDF, Rubex What should I tell my health care provider before I take this medicine? They need to know if you have any of these conditions:  heart disease  history of low blood counts caused by a medicine  liver disease  recent or ongoing radiation therapy  an unusual or allergic reaction to doxorubicin, other chemotherapy agents, other  medicines, foods, dyes, or preservatives  pregnant or trying to get pregnant  breast-feeding How should I use this medicine? This drug is given as an infusion into a vein. It is administered in a hospital or clinic by a specially trained health care professional. If you have pain, swelling, burning or any unusual feeling around the site of your injection, tell your health care professional right away. Talk to your pediatrician regarding the use of this medicine in children. Special care may be needed. Overdosage: If you think you have taken too much of this medicine contact a poison control center or emergency room at once. NOTE: This medicine is only for you. Do not share this medicine with others. What if I miss a dose? It is important not to miss your dose. Call your doctor or health care professional if you are unable to keep an appointment. What may interact with this medicine? This medicine may interact with the following medications:  6-mercaptopurine  paclitaxel  phenytoin  St. John's Wort  trastuzumab  verapamil This list may not describe all possible interactions. Give your health care provider a list of all the medicines, herbs, non-prescription drugs, or dietary supplements you use. Also tell them if you smoke, drink alcohol, or use illegal drugs. Some items may interact with your medicine. What should I watch for while using this medicine? This drug may make you feel generally unwell. This is not uncommon, as chemotherapy can affect healthy cells as well as cancer cells. Report any side effects. Continue your course of treatment even though you feel ill unless your doctor tells you to stop. There is a maximum amount of   this medicine you should receive throughout your life. The amount depends on the medical condition being treated and your overall health. Your doctor will watch how much of this medicine you receive in your lifetime. Tell your doctor if you have taken this  medicine before. You may need blood work done while you are taking this medicine. Your urine may turn red for a few days after your dose. This is not blood. If your urine is dark or brown, call your doctor. In some cases, you may be given additional medicines to help with side effects. Follow all directions for their use. Call your doctor or health care professional for advice if you get a fever, chills or sore throat, or other symptoms of a cold or flu. Do not treat yourself. This drug decreases your body's ability to fight infections. Try to avoid being around people who are sick. This medicine may increase your risk to bruise or bleed. Call your doctor or health care professional if you notice any unusual bleeding. Talk to your doctor about your risk of cancer. You may be more at risk for certain types of cancers if you take this medicine. Do not become pregnant while taking this medicine or for 6 months after stopping it. Women should inform their doctor if they wish to become pregnant or think they might be pregnant. Men should not father a child while taking this medicine and for 6 months after stopping it. There is a potential for serious side effects to an unborn child. Talk to your health care professional or pharmacist for more information. Do not breast-feed an infant while taking this medicine. This medicine has caused ovarian failure in some women and reduced sperm counts in some men This medicine may interfere with the ability to have a child. Talk with your doctor or health care professional if you are concerned about your fertility. This medicine may cause a decrease in Co-Enzyme Q-10. You should make sure that you get enough Co-Enzyme Q-10 while you are taking this medicine. Discuss the foods you eat and the vitamins you take with your health care professional. What side effects may I notice from receiving this medicine? Side effects that you should report to your doctor or health care  professional as soon as possible:  allergic reactions like skin rash, itching or hives, swelling of the face, lips, or tongue  breathing problems  chest pain  fast or irregular heartbeat  low blood counts - this medicine may decrease the number of white blood cells, red blood cells and platelets. You may be at increased risk for infections and bleeding.  pain, redness, or irritation at site where injected  signs of infection - fever or chills, cough, sore throat, pain or difficulty passing urine  signs of decreased platelets or bleeding - bruising, pinpoint red spots on the skin, black, tarry stools, blood in the urine  swelling of the ankles, feet, hands  tiredness  weakness Side effects that usually do not require medical attention (report to your doctor or health care professional if they continue or are bothersome):  diarrhea  hair loss  mouth sores  nail discoloration or damage  nausea  red colored urine  vomiting This list may not describe all possible side effects. Call your doctor for medical advice about side effects. You may report side effects to FDA at 1-800-FDA-1088. Where should I keep my medicine? This drug is given in a hospital or clinic and will not be stored at home. NOTE:   This sheet is a summary. It may not cover all possible information. If you have questions about this medicine, talk to your doctor, pharmacist, or health care provider.  2020 Elsevier/Gold Standard (2017-01-02 11:01:26)   Cyclophosphamide injection What is this medicine? CYCLOPHOSPHAMIDE (sye kloe FOSS fa mide) is a chemotherapy drug. It slows the growth of cancer cells. This medicine is used to treat many types of cancer like lymphoma, myeloma, leukemia, breast cancer, and ovarian cancer, to name a few. This medicine may be used for other purposes; ask your health care provider or pharmacist if you have questions. COMMON BRAND NAME(S): Cytoxan, Neosar What should I tell my  health care provider before I take this medicine? They need to know if you have any of these conditions:  blood disorders  history of other chemotherapy  infection  kidney disease  liver disease  recent or ongoing radiation therapy  tumors in the bone marrow  an unusual or allergic reaction to cyclophosphamide, other chemotherapy, other medicines, foods, dyes, or preservatives  pregnant or trying to get pregnant  breast-feeding How should I use this medicine? This drug is usually given as an injection into a vein or muscle or by infusion into a vein. It is administered in a hospital or clinic by a specially trained health care professional. Talk to your pediatrician regarding the use of this medicine in children. Special care may be needed. Overdosage: If you think you have taken too much of this medicine contact a poison control center or emergency room at once. NOTE: This medicine is only for you. Do not share this medicine with others. What if I miss a dose? It is important not to miss your dose. Call your doctor or health care professional if you are unable to keep an appointment. What may interact with this medicine? This medicine may interact with the following medications:  amiodarone  amphotericin B  azathioprine  certain antiviral medicines for HIV or AIDS such as protease inhibitors (e.g., indinavir, ritonavir) and zidovudine  certain blood pressure medications such as benazepril, captopril, enalapril, fosinopril, lisinopril, moexipril, monopril, perindopril, quinapril, ramipril, trandolapril  certain cancer medications such as anthracyclines (e.g., daunorubicin, doxorubicin), busulfan, cytarabine, paclitaxel, pentostatin, tamoxifen, trastuzumab  certain diuretics such as chlorothiazide, chlorthalidone, hydrochlorothiazide, indapamide, metolazone  certain medicines that treat or prevent blood clots like warfarin  certain muscle relaxants such as  succinylcholine  cyclosporine  etanercept  indomethacin  medicines to increase blood counts like filgrastim, pegfilgrastim, sargramostim  medicines used as general anesthesia  metronidazole  natalizumab This list may not describe all possible interactions. Give your health care provider a list of all the medicines, herbs, non-prescription drugs, or dietary supplements you use. Also tell them if you smoke, drink alcohol, or use illegal drugs. Some items may interact with your medicine. What should I watch for while using this medicine? Visit your doctor for checks on your progress. This drug may make you feel generally unwell. This is not uncommon, as chemotherapy can affect healthy cells as well as cancer cells. Report any side effects. Continue your course of treatment even though you feel ill unless your doctor tells you to stop. Drink water or other fluids as directed. Urinate often, even at night. In some cases, you may be given additional medicines to help with side effects. Follow all directions for their use. Call your doctor or health care professional for advice if you get a fever, chills or sore throat, or other symptoms of a cold or flu. Do not   treat yourself. This drug decreases your body's ability to fight infections. Try to avoid being around people who are sick. This medicine may increase your risk to bruise or bleed. Call your doctor or health care professional if you notice any unusual bleeding. Be careful brushing and flossing your teeth or using a toothpick because you may get an infection or bleed more easily. If you have any dental work done, tell your dentist you are receiving this medicine. You may get drowsy or dizzy. Do not drive, use machinery, or do anything that needs mental alertness until you know how this medicine affects you. Do not become pregnant while taking this medicine or for 1 year after stopping it. Women should inform their doctor if they wish to  become pregnant or think they might be pregnant. Men should not father a child while taking this medicine and for 4 months after stopping it. There is a potential for serious side effects to an unborn child. Talk to your health care professional or pharmacist for more information. Do not breast-feed an infant while taking this medicine. This medicine may interfere with the ability to have a child. This medicine has caused ovarian failure in some women. This medicine has caused reduced sperm counts in some men. You should talk with your doctor or health care professional if you are concerned about your fertility. If you are going to have surgery, tell your doctor or health care professional that you have taken this medicine. What side effects may I notice from receiving this medicine? Side effects that you should report to your doctor or health care professional as soon as possible:  allergic reactions like skin rash, itching or hives, swelling of the face, lips, or tongue  low blood counts - this medicine may decrease the number of white blood cells, red blood cells and platelets. You may be at increased risk for infections and bleeding.  signs of infection - fever or chills, cough, sore throat, pain or difficulty passing urine  signs of decreased platelets or bleeding - bruising, pinpoint red spots on the skin, black, tarry stools, blood in the urine  signs of decreased red blood cells - unusually weak or tired, fainting spells, lightheadedness  breathing problems  dark urine  dizziness  palpitations  swelling of the ankles, feet, hands  trouble passing urine or change in the amount of urine  weight gain  yellowing of the eyes or skin Side effects that usually do not require medical attention (report to your doctor or health care professional if they continue or are bothersome):  changes in nail or skin color  hair loss  missed menstrual periods  mouth sores  nausea,  vomiting This list may not describe all possible side effects. Call your doctor for medical advice about side effects. You may report side effects to FDA at 1-800-FDA-1088. Where should I keep my medicine? This drug is given in a hospital or clinic and will not be stored at home. NOTE: This sheet is a summary. It may not cover all possible information. If you have questions about this medicine, talk to your doctor, pharmacist, or health care provider.  2020 Elsevier/Gold Standard (2012-04-04 16:22:58)  

## 2019-04-14 NOTE — Patient Instructions (Signed)

## 2019-04-14 NOTE — Assessment & Plan Note (Addendum)
04/23/2018 palpable lump in right breast UOQ, mammogram revealed 2 separate masses.  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, margins negative, 2/3 lymph nodes positive, ER 95%, PR 0%, HER-2 negative, Ki-67 10%  Treatment plan: 1.Neoadjuvant antiestrogen therapy12/02/2018-July 2020 3.12/24/2018 Right lumpectomy: Residual invasive ductal carcinoma 3.8 cm, margins negative, 2/3 lymph nodes positive, ER 95%, PR 0%, HER-2 negative, Ki-67 10% 4.MammaPrint high risk: Adjuvant chemotherapy is being recommended with dose dense Adriamycin and Cytoxan followed by Taxol 5.adjuvant radiation therapy 5.Followed by adjuvant antiestrogen therapy ----------------------------------------------------------------------------------------------------------------------------------------------- 03/11/2019 bilateral mastectomies [name]):   Left breast: benign tissue and two negative lymph nodes Right breast: residual IDC 0.5 cm , clear margins, 2 lymph nodes negative, ER 95%, PR 0%, HER-2 -1+, Ki-67 10%  Treatment plan: Adjuvant chemotherapy with dose dense Adriamycin and Cytoxan x4 followed by Taxol weekly x12 clinical trialSWOG [id number] ---------------------------------------------------------------------------------------------------------------------------------- Current Treatment: Cycle 1 day 1 dose dense Adriamycin and Cytoxan Labs reviewed Chemo consent obtained chemo education completed Echocardiogram 02/12/2019: EF 55 to  60% Return to clinic in 1 week for toxicity check

## 2019-04-15 ENCOUNTER — Telehealth: Payer: Self-pay | Admitting: *Deleted

## 2019-04-15 NOTE — Telephone Encounter (Signed)
Spoke with patient to follow up after her 1st A/C.  Patient states she is feeling great. She is drinking and eating.  Her only complaint is a slight headache which I told her could be coming from the anti-nausea medication and informed she could take Tylenol.  Patient verbalized understanding.

## 2019-04-16 ENCOUNTER — Inpatient Hospital Stay: Payer: Medicare Other

## 2019-04-16 ENCOUNTER — Other Ambulatory Visit: Payer: Self-pay

## 2019-04-16 VITALS — BP 136/81 | HR 70 | Temp 97.9°F | Resp 18

## 2019-04-16 DIAGNOSIS — Z170 Estrogen receptor positive status [ER+]: Secondary | ICD-10-CM | POA: Diagnosis not present

## 2019-04-16 DIAGNOSIS — Z5111 Encounter for antineoplastic chemotherapy: Secondary | ICD-10-CM | POA: Diagnosis not present

## 2019-04-16 DIAGNOSIS — Z923 Personal history of irradiation: Secondary | ICD-10-CM | POA: Diagnosis not present

## 2019-04-16 DIAGNOSIS — Z9013 Acquired absence of bilateral breasts and nipples: Secondary | ICD-10-CM | POA: Diagnosis not present

## 2019-04-16 DIAGNOSIS — C50411 Malignant neoplasm of upper-outer quadrant of right female breast: Secondary | ICD-10-CM

## 2019-04-16 DIAGNOSIS — Z79811 Long term (current) use of aromatase inhibitors: Secondary | ICD-10-CM | POA: Diagnosis not present

## 2019-04-16 MED ORDER — PEGFILGRASTIM-JMDB 6 MG/0.6ML ~~LOC~~ SOSY
6.0000 mg | PREFILLED_SYRINGE | Freq: Once | SUBCUTANEOUS | Status: AC
  Administered 2019-04-16: 6 mg via SUBCUTANEOUS

## 2019-04-16 MED ORDER — PEGFILGRASTIM-JMDB 6 MG/0.6ML ~~LOC~~ SOSY
PREFILLED_SYRINGE | SUBCUTANEOUS | Status: AC
  Filled 2019-04-16: qty 0.6

## 2019-04-16 NOTE — Patient Instructions (Signed)

## 2019-04-20 ENCOUNTER — Telehealth: Payer: Self-pay | Admitting: *Deleted

## 2019-04-20 NOTE — Progress Notes (Signed)
Patient Care Team: [name], MD as PCP - General (Family Medicine)  DIAGNOSIS:    ICD-10-CM   1. Malignant neoplasm of upper-outer quadrant of right breast in female, estrogen receptor positive ([place])  C50.411    Z17.0     SUMMARY OF ONCOLOGIC HISTORY: Oncology History  Malignant neoplasm of upper-outer quadrant of right breast in female, estrogen receptor positive ([place])  04/23/2018 Initial Diagnosis   Palpable lump in right breast UOQ, mammogram revealed 2 separate masses.  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  The Multi-Gene Panel offered by [facility] includes sequencing and/or deletion duplication testing of the following 85 genes: AIP, ALK, APC, ATM, AXIN2,BAP1,  BARD1, BLM, BMPR1A, BRCA1, BRCA2, BRIP1, CASR, CDC73, CDH1, CDK4, CDKN1B, CDKN1C, CDKN2A (p14ARF), CDKN2A (p16INK4a), CEBPA, CHEK2, CTNNA1, DICER1, DIS3L2, EGFR (c.2369C>T, p.Thr790Met variant only), EPCAM (Deletion/duplication testing only), FH, FLCN, GATA2, GPC3, GREM1 (Promoter region deletion/duplication testing only), HOXB13 (c.251G>A, p.Gly84Glu), HRAS, KIT, MAX, MEN1, MET, MITF (c.952G>A, p.Glu318Lys variant only), MLH1, MSH2, MSH3, MSH6, MUTYH, NBN, NF1, NF2, NTHL1, PALB2, PDGFRA, PHOX2B, PMS2, POLD1, POLE, POT1, PRKAR1A, PTCH1, PTEN, RAD50, RAD51C, RAD51D, RB1, RECQL4, RET, RNF43, RUNX1, SDHAF2, SDHA (sequence changes only), SDHB, SDHC, SDHD, SMAD4, SMARCA4, SMARCB1, SMARCE1, STK11,  SUFU, TERC, TERT, TMEM127, TP53, TSC1, TSC2, VHL, WRN and WT1.      12/24/2018 Surgery   Right lumpectomy [name]): residual invasive carcinoma, clear margins, and 2/3 lymph nodes positive for carcinoma.    01/14/2019 Cancer Staging   Staging form: Breast, AJCC 8th Edition - Pathologic stage from 01/14/2019: No Stage Recommended (ypT2, pN1a, cM0, G2, ER+, PR-, HER2-) - Signed by [name], MD on 01/14/2019   01/22/2019 Miscellaneous   MammaPrint: High risk luminal type B   03/11/2019 Surgery   Bilateral mastectomies [name]):   Left breast: benign tissue and two negative lymph nodes Right breast: residual IDC 0.5 cm, clear margins, 2 lymph nodes negativeER 95%, PR 0%, HER-2 -1+, Ki-67 10%   04/14/2019 -  Chemotherapy   The patient had DOXOrubicin (ADRIAMYCIN) chemo injection 102 mg, 60 mg/m2 = 102 mg, Intravenous,  Once, 1 of 4 cycles Administration: 102 mg (04/14/2019) palonosetron (ALOXI) injection 0.25 mg, 0.25 mg, Intravenous,  Once, 1 of 4 cycles Administration: 0.25 mg (04/14/2019) pegfilgrastim-jmdb (FULPHILA) injection 6 mg, 6 mg, Subcutaneous,  Once, 1 of 4 cycles Administration: 6 mg (04/16/2019) cyclophosphamide (CYTOXAN) 1,020 mg in sodium chloride 0.9 % 250 mL chemo infusion, 600 mg/m2 = 1,020 mg, Intravenous,  Once, 1 of 4 cycles Administration: 1,020 mg (04/14/2019) PACLitaxel (TAXOL) 138 mg in sodium chloride 0.9 % 250 mL chemo infusion (</= 47m/m2), 80 mg/m2 = 138 mg, Intravenous,  Once, 0 of 12 cycles fosaprepitant (EMEND) 150 mg, dexamethasone (DECADRON) 12 mg in sodium chloride 0.9 % 145 mL IVPB, , Intravenous,  Once, 1 of 4 cycles Administration:  (04/14/2019)  for chemotherapy treatment.      CHIEF COMPLIANT: Cycle 1 Day 8 Adriamycin and Cytoxan  INTERVAL HISTORY: Samantha Cross is a 73 y.o. with above-mentioned history of right breast cancer treated with neoadjuvant letrozole, lumpectomy, and for which Mammaprint testing revealed she was high  risk.She underwent bilateral mastectomies and is currently on adjuvant chemotherapy with dose dense Adriamycin and Cytoxan. She presents to the clinic today for a toxicity check following cycle 1.   REVIEW OF SYSTEMS:   Constitutional: Denies fevers, chills or abnormal weight loss Eyes: Denies blurriness of vision Ears, nose, mouth, throat, and face: Denies mucositis or sore throat Respiratory: Denies cough, dyspnea or wheezes Cardiovascular: Denies palpitation, chest discomfort Gastrointestinal: Denies nausea, heartburn or change in bowel habits Skin: Denies abnormal skin rashes Lymphatics: Denies new lymphadenopathy or easy bruising Neurological: Denies numbness, tingling or new weaknesses Behavioral/Psych: Mood is stable, no new changes  Extremities: No lower extremity edema Breast: denies any pain or lumps or nodules in either breasts All other systems were reviewed with the patient and are negative.  I have reviewed the past medical history, past surgical history, social history and family history with the patient and they are unchanged from previous note.  ALLERGIES:  is allergic to lisinopril; alendronate [alendronate]; and hydroxyzine.  MEDICATIONS:  Current Outpatient Medications  Medication Sig Dispense Refill   amLODipine (NORVASC) 10 MG tablet TAKE 1 TABLET(10 MG) BY MOUTH DAILY 90 tablet 1   CLOBETASOL PROPIONATE E 0.05 % emollient cream APPLY EXTERNALLY TO THE AFFECTED AREA TWICE DAILY (Patient taking differently: Apply 1 application topically 2 (two) times daily as needed (rash). ) 45 g prn   dexamethasone (DECADRON) 4 MG tablet Take 1 tablet day after chemo and 1 tablet 2 days after chemo with food 30 tablet 1   HYDROcodone-acetaminophen (NORCO/VICODIN) 5-325 MG tablet Take 1-2 tablets by mouth every 6 (six) hours as needed for moderate pain. 15 tablet 0   letrozole (FEMARA) 2.5 MG tablet Take 1 tablet (2.5 mg total) by mouth daily. 90 tablet 0    lidocaine-prilocaine (EMLA) cream Apply to affected area once 30 g 3   LORazepam (ATIVAN) 0.5 MG tablet Take 1 tablet (0.5 mg total) by mouth every 6 (six) hours as needed (Nausea or vomiting). 30 tablet 0   methocarbamol (ROBAXIN) 500 MG tablet Take 1 tablet (500 mg total) by mouth every 6 (six) hours as needed for muscle spasms. 20 tablet 1   Misc Natural Products (ESSIAC TONIC) CAPS Take 1-2 capsules by mouth 2 (two) times daily. Vegetable     ondansetron (ZOFRAN) 8 MG tablet Take 1 tablet (8 mg total) by mouth 2 (two) times daily as needed. Start on the third day after chemotherapy. 30 tablet 1   OVER THE COUNTER MEDICATION Take 2 capsules by mouth 2 (two) times daily as needed (Supplement). [employer]     prochlorperazine (COMPAZINE) 10 MG tablet Take 1 tablet (10 mg total) by mouth every 6 (six) hours as needed (Nausea or vomiting). 30 tablet 1   simvastatin (ZOCOR) 20 MG tablet TAKE 1 TABLET BY MOUTH EVERY NIGHT AT BEDTIME 90 tablet 0   No current facility-administered medications for this visit.     PHYSICAL EXAMINATION: ECOG PERFORMANCE STATUS: 1 - Symptomatic but completely ambulatory  Vitals:   04/21/19 1419  BP: 128/71  Pulse: 82  Resp: 18  Temp: 97.9 F (36.6 C)  SpO2: 99%   Filed Weights   04/21/19 1419  Weight: 141 lb 3.2 oz (64 kg)    GENERAL: alert, no distress and comfortable SKIN: skin color, texture, turgor are  normal, no rashes or significant lesions EYES: normal, Conjunctiva are pink and non-injected, sclera clear OROPHARYNX: no exudate, no erythema and lips, buccal mucosa, and tongue normal  NECK: supple, thyroid normal size, non-tender, without nodularity LYMPH: no palpable lymphadenopathy in the cervical, axillary or inguinal LUNGS: clear to auscultation and percussion with normal breathing effort HEART: regular rate & rhythm and no murmurs and no lower extremity edema ABDOMEN: abdomen soft, non-tender and normal bowel  sounds MUSCULOSKELETAL: no cyanosis of digits and no clubbing  NEURO: alert & oriented x 3 with fluent speech, no focal motor/sensory deficits EXTREMITIES: No lower extremity edema  LABORATORY DATA:  I have reviewed the data as listed CMP Latest Ref Rng & Units 04/14/2019 03/03/2019 04/09/2018  Glucose 70 - 99 mg/dL 95 103(H) 103(H)  BUN 8 - 23 mg/dL _0 Creatinine 0.44 - 1.00 mg/dL 0.73 0.75 0.78  Sodium 135 - 145 mmol/L 143 140 144  Potassium 3.5 - 5.1 mmol/L 4.0 3.7 4.1  Chloride 98 - 111 mmol/L 109 104 107  CO2 22 - 32 mmol/L _1 Calcium 8.9 - 10.3 mg/dL 9.1 9.4 9.8  Total Protein 6.5 - 8.1 g/dL 7.6 - 7.3  Total Bilirubin 0.3 - 1.2 mg/dL 0.9 - 0.9  Alkaline Phos 38 - 126 U/L 85 - -  AST 15 - 41 U/L 17 - 14  ALT 0 - 44 U/L 14 - 15    Lab Results  Component Value Date   WBC 2.0 (L) 04/21/2019   HGB 13.2 04/21/2019   HCT 41.0 04/21/2019   MCV 93.0 04/21/2019   PLT 61 (L) 04/21/2019   NEUTROABS PENDING 04/21/2019    ASSESSMENT & PLAN:  Malignant neoplasm of upper-outer quadrant of right breast in female, estrogen receptor positive ([place]) 04/23/2018 palpable lump in right breast UOQ, mammogram revealed 2 separate masses. 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, margins negative, 2/3 lymph nodes positive, ER 95%, PR 0%, HER-2 negative, Ki-67 10%  Treatment plan: 1.Neoadjuvant antiestrogen therapy12/02/2018-July 2020 3.12/24/2018 Right lumpectomy: Residual invasive ductal carcinoma 3.8 cm,  margins negative, 2/3 lymph nodes positive, ER 95%, PR 0%, HER-2 negative, Ki-67 10% 4.MammaPrint high risk: Adjuvant chemotherapy is being recommended with dose dense Adriamycin and Cytoxan followed by Taxol 5.adjuvant radiation therapy 5.Followed by adjuvant antiestrogen therapy ----------------------------------------------------------------------------------------------------------------------------------------------- 03/11/2019 bilateral mastectomies [name]):  Left breast: benign tissue and two negative lymph nodes Right breast: residual IDC0.5 cm , clear margins, 2 lymph nodes negative, ER 95%, PR 0%, HER-2 -1+, Ki-67 10%  Treatment plan: Adjuvant chemotherapy with dose dense Adriamycin and Cytoxan x4 followed by Taxol weekly x12 clinical trialSWOG 1714 ---------------------------------------------------------------------------------------------------------------------------------- Current Treatment: Cycle 1 day 8 dose dense Adriamycin and Cytoxan Labs reviewed Echocardiogram 02/12/2019: EF 55 to 60%  Chemo toxicities: 1.  Headaches due to antinausea drugs 2.  Constipation Denies any nausea or vomiting.  Return to clinic in 1 week for cycle 2    No orders of the defined types were placed in this encounter.  The patient has a good  understanding of the overall plan. she agrees with it. she will call with any problems that may develop before the next visit here.  [name], MD 04/21/2019  Julious Oka Dorshimer, am acting as scribe for Dr. [name].  I have reviewed the above documentation for accuracy and completeness, and I agree with the above.

## 2019-04-20 NOTE — Telephone Encounter (Signed)
Received call from pt stating she has been experiencing constipation x 2 days.  Pt wanting to know if she can take anything OTC to help alleviate constipation.  RN informed pt she should start taking colace daily to prevent constipation related to pain and nausea medication.  RN also educated pt she can take Miralax as well.  Pt verbalized understanding.

## 2019-04-21 ENCOUNTER — Other Ambulatory Visit: Payer: Self-pay

## 2019-04-21 ENCOUNTER — Other Ambulatory Visit: Payer: Self-pay | Admitting: Hematology and Oncology

## 2019-04-21 ENCOUNTER — Inpatient Hospital Stay: Payer: Medicare Other

## 2019-04-21 ENCOUNTER — Inpatient Hospital Stay (HOSPITAL_BASED_OUTPATIENT_CLINIC_OR_DEPARTMENT_OTHER): Payer: Medicare Other | Admitting: Hematology and Oncology

## 2019-04-21 ENCOUNTER — Encounter: Payer: Self-pay | Admitting: *Deleted

## 2019-04-21 DIAGNOSIS — C50411 Malignant neoplasm of upper-outer quadrant of right female breast: Secondary | ICD-10-CM | POA: Diagnosis not present

## 2019-04-21 DIAGNOSIS — Z170 Estrogen receptor positive status [ER+]: Secondary | ICD-10-CM

## 2019-04-21 DIAGNOSIS — Z79811 Long term (current) use of aromatase inhibitors: Secondary | ICD-10-CM | POA: Diagnosis not present

## 2019-04-21 DIAGNOSIS — Z9013 Acquired absence of bilateral breasts and nipples: Secondary | ICD-10-CM | POA: Diagnosis not present

## 2019-04-21 DIAGNOSIS — Z923 Personal history of irradiation: Secondary | ICD-10-CM | POA: Diagnosis not present

## 2019-04-21 DIAGNOSIS — Z5111 Encounter for antineoplastic chemotherapy: Secondary | ICD-10-CM | POA: Diagnosis not present

## 2019-04-21 LAB — CBC WITH DIFFERENTIAL (CANCER CENTER ONLY)
Abs Immature Granulocytes: 0 10*3/uL (ref 0.00–0.07)
Basophils Absolute: 0.1 10*3/uL (ref 0.0–0.1)
Basophils Relative: 3 %
Eosinophils Absolute: 0.1 10*3/uL (ref 0.0–0.5)
Eosinophils Relative: 6 %
HCT: 41 % (ref 36.0–46.0)
Hemoglobin: 13.2 g/dL (ref 12.0–15.0)
Lymphocytes Relative: 73 %
Lymphs Abs: 1.5 10*3/uL (ref 0.7–4.0)
MCH: 29.9 pg (ref 26.0–34.0)
MCHC: 32.2 g/dL (ref 30.0–36.0)
MCV: 93 fL (ref 80.0–100.0)
Monocytes Absolute: 0.1 10*3/uL (ref 0.1–1.0)
Monocytes Relative: 4 %
Neutro Abs: 0.3 10*3/uL — CL (ref 1.7–7.7)
Neutrophils Relative %: 14 %
Platelet Count: 61 10*3/uL — ABNORMAL LOW (ref 150–400)
RBC: 4.41 MIL/uL (ref 3.87–5.11)
RDW: 12.6 % (ref 11.5–15.5)
WBC Count: 2 10*3/uL — ABNORMAL LOW (ref 4.0–10.5)
nRBC: 0 % (ref 0.0–0.2)

## 2019-04-21 LAB — CMP (CANCER CENTER ONLY)
ALT: 12 U/L (ref 0–44)
AST: 6 U/L — ABNORMAL LOW (ref 15–41)
Albumin: 3.5 g/dL (ref 3.5–5.0)
Alkaline Phosphatase: 90 U/L (ref 38–126)
Anion gap: 11 (ref 5–15)
BUN: 11 mg/dL (ref 8–23)
CO2: 26 mmol/L (ref 22–32)
Calcium: 8.7 mg/dL — ABNORMAL LOW (ref 8.9–10.3)
Chloride: 104 mmol/L (ref 98–111)
Creatinine: 0.74 mg/dL (ref 0.44–1.00)
GFR, Est AFR Am: >60 mL/min (ref >60)
GFR, Estimated: >60 mL/min (ref >60)
Glucose, Bld: 146 mg/dL — ABNORMAL HIGH (ref 70–99)
Potassium: 3.8 mmol/L (ref 3.5–5.1)
Sodium: 141 mmol/L (ref 135–145)
Total Bilirubin: 0.9 mg/dL (ref 0.3–1.2)
Total Protein: 6.5 g/dL (ref 6.5–8.1)

## 2019-04-21 NOTE — Progress Notes (Signed)
CRITICAL VALUE ALERT  Critical Value:  ANC 0.3  Date & Time Notied:  04/21/2019 1500  Provider Notified: [name]   Orders Received/Actions taken: MD aware

## 2019-04-21 NOTE — Assessment & Plan Note (Signed)
04/23/2018 palpable lump in right breast UOQ, mammogram revealed 2 separate masses. 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, margins negative, 2/3 lymph nodes positive, ER 95%, PR 0%, HER-2 negative, Ki-67 10%  Treatment plan: 1.Neoadjuvant antiestrogen therapy12/02/2018-July 2020 3.12/24/2018 Right lumpectomy: Residual invasive ductal carcinoma 3.8 cm, margins negative, 2/3 lymph nodes positive, ER 95%, PR 0%, HER-2 negative, Ki-67 10% 4.MammaPrint high risk: Adjuvant chemotherapy is being recommended with dose dense Adriamycin and Cytoxan followed by Taxol 5.adjuvant radiation therapy 5.Followed by adjuvant antiestrogen therapy ----------------------------------------------------------------------------------------------------------------------------------------------- 03/11/2019 bilateral mastectomies ([place]):  Left breast: benign tissue and two negative lymph nodes Right breast: residual IDC0.5 cm , clear margins, 2 lymph nodes negative, ER 95%, PR 0%, HER-2 -1+, Ki-67 10%  Treatment plan: Adjuvant chemotherapy with dose dense Adriamycin and Cytoxan x4 followed by Taxol weekly x12 clinical trialSWOG 1714 ---------------------------------------------------------------------------------------------------------------------------------- Current Treatment: Cycle 1 day 8 dose dense Adriamycin and Cytoxan Labs reviewed Echocardiogram 02/12/2019: EF 55 to 60%  Chemo toxicities: 1.  Headaches due to antinausea  drugs 2.  Constipation  Return to clinic in 1 week for cycle 2 

## 2019-04-27 NOTE — Progress Notes (Signed)
Patient Care Team: Samantha Marry, MD as PCP - General (Family Medicine)  DIAGNOSIS:    ICD-10-CM   1. Malignant neoplasm of upper-outer quadrant of right breast in female, estrogen receptor positive (Samantha Cross)  C50.411    Z17.0     SUMMARY OF ONCOLOGIC HISTORY: Oncology History  Malignant neoplasm of upper-outer quadrant of right breast in female, estrogen receptor positive (Samantha Cross)  04/23/2018 Initial Diagnosis   Palpable lump in right breast UOQ, mammogram revealed 2 separate masses.  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  The Multi-Gene Panel offered by [facility] includes sequencing and/or deletion duplication testing of the following 85 genes: AIP, ALK, APC, ATM, AXIN2,BAP1,  BARD1, BLM, BMPR1A, BRCA1, BRCA2, BRIP1, CASR, CDC73, CDH1, CDK4, CDKN1B, CDKN1C, CDKN2A (p14ARF), CDKN2A (p16INK4a), CEBPA, CHEK2, CTNNA1, DICER1, DIS3L2, EGFR (c.2369C>T, p.Thr790Met variant only), EPCAM (Deletion/duplication testing only), FH, FLCN, GATA2, GPC3, GREM1 (Promoter region deletion/duplication testing only), HOXB13 (c.251G>A, p.Gly84Glu), HRAS, KIT, MAX, MEN1, MET, MITF (c.952G>A, p.Glu318Lys variant only), MLH1, MSH2, MSH3, MSH6, MUTYH, NBN, NF1, NF2, NTHL1, PALB2, PDGFRA, PHOX2B, PMS2, POLD1, POLE, POT1, PRKAR1A, PTCH1, PTEN, RAD50, RAD51C, RAD51D, RB1, RECQL4, RET, RNF43, RUNX1, SDHAF2, SDHA (sequence changes only), SDHB, SDHC, SDHD, SMAD4, SMARCA4, SMARCB1, SMARCE1, STK11,  SUFU, TERC, TERT, TMEM127, TP53, TSC1, TSC2, VHL, WRN and WT1.      12/24/2018 Surgery   Right lumpectomy Samantha Cross): residual invasive carcinoma, clear margins, and 2/3 lymph nodes positive for carcinoma.    01/14/2019 Cancer Staging   Staging form: Breast, AJCC 8th Edition - Pathologic stage from 01/14/2019: No Stage Recommended (ypT2, pN1a, cM0, G2, ER+, PR-, HER2-) - Signed by Samantha Gibson, MD on 01/14/2019   01/22/2019 Miscellaneous   MammaPrint: High risk luminal type B   03/11/2019 Surgery   Bilateral mastectomies Samantha Cross):   Left breast: benign tissue and two negative lymph nodes Right breast: residual IDC 0.5 cm, clear margins, 2 lymph nodes negativeER 95%, PR 0%, HER-2 -1+, Ki-67 10%   04/14/2019 -  Chemotherapy   The patient had DOXOrubicin (ADRIAMYCIN) chemo injection 102 mg, 60 mg/m2 = 102 mg, Intravenous,  Once, 1 of 4 cycles Dose modification: 50 mg/m2 (original dose 60 mg/m2, Cycle 2, Reason: Dose not tolerated) Administration: 102 mg (04/14/2019) palonosetron (ALOXI) injection 0.25 mg, 0.25 mg, Intravenous,  Once, 1 of 4 cycles Administration: 0.25 mg (04/14/2019) pegfilgrastim-jmdb (FULPHILA) injection 6 mg, 6 mg, Subcutaneous,  Once, 1 of 4 cycles Administration: 6 mg (04/16/2019) cyclophosphamide (CYTOXAN) 1,020 mg in sodium chloride 0.9 % 250 mL chemo infusion, 600 mg/m2 = 1,020 mg, Intravenous,  Once, 1 of 4 cycles Dose modification: 500 mg/m2 (original dose 600 mg/m2, Cycle 2, Reason: Dose not tolerated) Administration: 1,020 mg (04/14/2019) PACLitaxel (TAXOL) 138 mg in sodium chloride 0.9 % 250 mL chemo infusion (</= 40m/m2), 80 mg/m2 = 138 mg, Intravenous,  Once, 0 of 12 cycles fosaprepitant (EMEND) 150 mg, dexamethasone (DECADRON) 12 mg in sodium chloride 0.9 % 145 mL IVPB, ,  Intravenous,  Once, 1 of 4 cycles Administration:  (04/14/2019)  for chemotherapy treatment.      CHIEF COMPLIANT: Cycle 2 Adriamycin and Cytoxan  INTERVAL HISTORY: Samantha Cross is a 73 y.o. with above-mentioned history of right breast cancer treated with neoadjuvant letrozole, lumpectomy, and for which Mammaprint testing revealed she was high risk.She underwent bilateral mastectomiesand is currently on adjuvant chemotherapy withdose dense Adriamycin and Cytoxan.She presents to the clinic today for cycle 2.   REVIEW OF SYSTEMS:   Constitutional: Denies fevers, chills or abnormal weight loss Eyes: Denies blurriness of vision Ears, nose, mouth, throat, and face: Denies mucositis or sore throat Respiratory: Denies cough, dyspnea or wheezes Cardiovascular: Denies palpitation, chest discomfort Gastrointestinal: Denies nausea, heartburn or change in bowel habits Skin: Denies abnormal skin rashes Lymphatics: Denies new lymphadenopathy or easy bruising Neurological: Denies numbness, tingling or new weaknesses Behavioral/Psych: Mood is stable, no new changes  Extremities: No lower extremity edema Breast: denies any pain or lumps or nodules in either breasts All other systems were reviewed with the patient and are negative.  I have reviewed the past medical history, past surgical history, social history and family history with the patient and they are unchanged from previous note.  ALLERGIES:  is allergic to lisinopril; alendronate [alendronate]; and hydroxyzine.  MEDICATIONS:  Current Outpatient Medications  Medication Sig Dispense Refill   amLODipine (NORVASC) 10 MG tablet TAKE 1 TABLET(10 MG) BY MOUTH DAILY 90 tablet 1   CLOBETASOL PROPIONATE E 0.05 % emollient cream APPLY EXTERNALLY TO THE AFFECTED AREA TWICE DAILY (Patient taking differently: Apply 1 application topically 2 (two) times daily as needed (rash). ) 45 g prn   dexamethasone (DECADRON) 4 MG tablet Take 1 tablet day after chemo and 1 tablet 2 days after chemo with food 30 tablet 1   HYDROcodone-acetaminophen (NORCO/VICODIN) 5-325 MG tablet Take 1-2 tablets by mouth every 6 (six) hours as  needed for moderate pain. 15 tablet 0   letrozole (FEMARA) 2.5 MG tablet Take 1 tablet (2.5 mg total) by mouth daily. 90 tablet 0   lidocaine-prilocaine (EMLA) cream Apply to affected area once 30 g 3   LORazepam (ATIVAN) 0.5 MG tablet Take 1 tablet (0.5 mg total) by mouth every 6 (six) hours as needed (Nausea or vomiting). 30 tablet 0   methocarbamol (ROBAXIN) 500 MG tablet Take 1 tablet (500 mg total) by mouth every 6 (six) hours as needed for muscle spasms. 20 tablet 1   Misc Natural Products (ESSIAC TONIC) CAPS Take 1-2 capsules by mouth 2 (two) times daily. Vegetable     ondansetron (ZOFRAN) 8 MG tablet Take 1 tablet (8 mg total) by mouth 2 (two) times daily as needed. Start on the third day after chemotherapy. 30 tablet 1   OVER THE COUNTER MEDICATION Take 2 capsules by mouth 2 (two) times daily as needed (Supplement). [employer]     prochlorperazine (COMPAZINE) 10 MG tablet Take 1 tablet (10 mg total) by mouth every 6 (six) hours as needed (Nausea or vomiting). 30 tablet 1   simvastatin (ZOCOR) 20 MG tablet TAKE 1 TABLET BY MOUTH EVERY NIGHT AT BEDTIME 90 tablet 0   No current facility-administered medications for this visit.     PHYSICAL EXAMINATION: ECOG PERFORMANCE STATUS: 1 - Symptomatic but completely ambulatory  Vitals:   04/28/19 1314  BP: 136/81  Pulse: 84  Resp: 16  Temp: 98.7 F (37.1 C)  SpO2: 100%   Filed Weights   04/28/19 1314  Weight: 141  lb 4.8 oz (64.1 kg)    GENERAL: alert, no distress and comfortable SKIN: skin color, texture, turgor are normal, no rashes or significant lesions EYES: normal, Conjunctiva are pink and non-injected, sclera clear OROPHARYNX: no exudate, no erythema and lips, buccal mucosa, and tongue normal  NECK: supple, thyroid normal size, non-tender, without nodularity LYMPH: no palpable lymphadenopathy in the cervical, axillary or inguinal LUNGS: clear to auscultation and percussion with normal breathing effort HEART:  regular rate & rhythm and no murmurs and no lower extremity edema ABDOMEN: abdomen soft, non-tender and normal bowel sounds MUSCULOSKELETAL: no cyanosis of digits and no clubbing  NEURO: alert & oriented x 3 with fluent speech, no focal motor/sensory deficits EXTREMITIES: No lower extremity edema  LABORATORY DATA:  I have reviewed the data as listed CMP Latest Ref Rng & Units 04/21/2019 04/14/2019 03/03/2019  Glucose 70 - 99 mg/dL 146(H) 95 103(H)  BUN 8 - 23 mg/dL _0 Creatinine 0.44 - 1.00 mg/dL 0.74 0.73 0.75  Sodium 135 - 145 mmol/L 141 143 140  Potassium 3.5 - 5.1 mmol/L 3.8 4.0 3.7  Chloride 98 - 111 mmol/L 104 109 104  CO2 22 - 32 mmol/L _1 Calcium 8.9 - 10.3 mg/dL 8.7(L) 9.1 9.4  Total Protein 6.5 - 8.1 g/dL 6.5 7.6 -  Total Bilirubin 0.3 - 1.2 mg/dL 0.9 0.9 -  Alkaline Phos 38 - 126 U/L 90 85 -  AST 15 - 41 U/L 6(L) 17 -  ALT 0 - 44 U/L 12 14 -    Lab Results  Component Value Date   WBC 12.3 (H) 04/28/2019   HGB 13.3 04/28/2019   HCT 41.5 04/28/2019   MCV 92.8 04/28/2019   PLT 227 04/28/2019   NEUTROABS PENDING 04/28/2019    ASSESSMENT & PLAN:  Malignant neoplasm of upper-outer quadrant of right breast in female, estrogen receptor positive ([place]) 04/23/2018 palpable lump in right breast UOQ, mammogram revealed 2 separate masses. 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, margins negative, 2/3 lymph nodes positive, ER 95%, PR 0%, HER-2 negative, Ki-67 10%  Treatment  plan: 1.Neoadjuvant antiestrogen therapy12/02/2018-July 2020 3.12/24/2018 Right lumpectomy: Residual invasive ductal carcinoma 3.8 cm, margins negative, 2/3 lymph nodes positive, ER 95%, PR 0%, HER-2 negative, Ki-67 10% 4.MammaPrint high risk: Adjuvant chemotherapy is being recommended with dose dense Adriamycin and Cytoxan followed by Taxol 5.adjuvant radiation therapy 5.Followed by adjuvant antiestrogen therapy ----------------------------------------------------------------------------------------------------------------------------------------------- 03/11/2019 bilateral mastectomies Samantha Cross):  Left breast: benign tissue and two negative lymph nodes Right breast: residual IDC0.5 cm , clear margins, 2 lymph nodes negative, ER 95%, PR 0%, HER-2 -1+, Ki-67 10%  Treatment plan: Adjuvant chemotherapy with dose dense Adriamycin and Cytoxan x4 followed by Taxol weekly x12 clinical trialSWOG 1714 ---------------------------------------------------------------------------------------------------------------------------------- Current Treatment:Cycle 2 dose dense Adriamycin and Cytoxan Labs reviewed Echocardiogram9/03/2019: EF 55 to 60%  Chemo toxicities: 1.  Headaches due to antinausea drugs 2.  Constipation Denies any nausea or vomiting.  Return to clinic in 2 weeks for cycle 3     No  orders of the defined types were placed in this encounter.  The patient has a good understanding of the overall plan. she agrees with it. she will call with any problems that may develop before the next visit here.  Samantha Lose, MD 04/28/2019  Julious Oka Dorshimer, am acting as scribe for Dr. Nicholas Cross.  I have reviewed the above documentation for accuracy and completeness, and I agree with the above.

## 2019-04-28 ENCOUNTER — Inpatient Hospital Stay: Payer: Medicare Other

## 2019-04-28 ENCOUNTER — Inpatient Hospital Stay (HOSPITAL_BASED_OUTPATIENT_CLINIC_OR_DEPARTMENT_OTHER): Payer: Medicare Other | Admitting: Hematology and Oncology

## 2019-04-28 ENCOUNTER — Other Ambulatory Visit: Payer: Self-pay

## 2019-04-28 DIAGNOSIS — C50411 Malignant neoplasm of upper-outer quadrant of right female breast: Secondary | ICD-10-CM

## 2019-04-28 DIAGNOSIS — Z95828 Presence of other vascular implants and grafts: Secondary | ICD-10-CM

## 2019-04-28 DIAGNOSIS — Z170 Estrogen receptor positive status [ER+]: Secondary | ICD-10-CM

## 2019-04-28 DIAGNOSIS — Z5111 Encounter for antineoplastic chemotherapy: Secondary | ICD-10-CM | POA: Diagnosis not present

## 2019-04-28 DIAGNOSIS — Z79811 Long term (current) use of aromatase inhibitors: Secondary | ICD-10-CM | POA: Diagnosis not present

## 2019-04-28 DIAGNOSIS — Z923 Personal history of irradiation: Secondary | ICD-10-CM | POA: Diagnosis not present

## 2019-04-28 DIAGNOSIS — Z9013 Acquired absence of bilateral breasts and nipples: Secondary | ICD-10-CM | POA: Diagnosis not present

## 2019-04-28 LAB — CMP (CANCER CENTER ONLY)
ALT: 15 U/L (ref 0–44)
AST: 13 U/L — ABNORMAL LOW (ref 15–41)
Albumin: 3.6 g/dL (ref 3.5–5.0)
Alkaline Phosphatase: 106 U/L (ref 38–126)
Anion gap: 9 (ref 5–15)
BUN: 9 mg/dL (ref 8–23)
CO2: 24 mmol/L (ref 22–32)
Calcium: 8.7 mg/dL — ABNORMAL LOW (ref 8.9–10.3)
Chloride: 107 mmol/L (ref 98–111)
Creatinine: 0.82 mg/dL (ref 0.44–1.00)
GFR, Est AFR Am: >60 mL/min (ref >60)
GFR, Estimated: >60 mL/min (ref >60)
Glucose, Bld: 94 mg/dL (ref 70–99)
Potassium: 4 mmol/L (ref 3.5–5.1)
Sodium: 140 mmol/L (ref 135–145)
Total Bilirubin: 0.3 mg/dL (ref 0.3–1.2)
Total Protein: 6.9 g/dL (ref 6.5–8.1)

## 2019-04-28 LAB — CBC WITH DIFFERENTIAL (CANCER CENTER ONLY)
Abs Immature Granulocytes: 0.89 10*3/uL — ABNORMAL HIGH (ref 0.00–0.07)
Basophils Absolute: 0.1 10*3/uL (ref 0.0–0.1)
Basophils Relative: 1 %
Eosinophils Absolute: 0 10*3/uL (ref 0.0–0.5)
Eosinophils Relative: 0 %
HCT: 41.5 % (ref 36.0–46.0)
Hemoglobin: 13.3 g/dL (ref 12.0–15.0)
Immature Granulocytes: 7 %
Lymphocytes Relative: 15 %
Lymphs Abs: 1.8 10*3/uL (ref 0.7–4.0)
MCH: 29.8 pg (ref 26.0–34.0)
MCHC: 32 g/dL (ref 30.0–36.0)
MCV: 92.8 fL (ref 80.0–100.0)
Monocytes Absolute: 1 10*3/uL (ref 0.1–1.0)
Monocytes Relative: 8 %
Neutro Abs: 8.4 10*3/uL — ABNORMAL HIGH (ref 1.7–7.7)
Neutrophils Relative %: 69 %
Platelet Count: 227 10*3/uL (ref 150–400)
RBC: 4.47 MIL/uL (ref 3.87–5.11)
RDW: 13.3 % (ref 11.5–15.5)
WBC Count: 12.3 10*3/uL — ABNORMAL HIGH (ref 4.0–10.5)
nRBC: 0 % (ref 0.0–0.2)

## 2019-04-28 MED ORDER — SODIUM CHLORIDE 0.9% FLUSH
10.0000 mL | INTRAVENOUS | Status: DC | PRN
  Administered 2019-04-28: 10 mL
  Filled 2019-04-28: qty 10

## 2019-04-28 MED ORDER — SODIUM CHLORIDE 0.9 % IV SOLN
500.0000 mg/m2 | Freq: Once | INTRAVENOUS | Status: AC
  Administered 2019-04-28: 840 mg via INTRAVENOUS
  Filled 2019-04-28: qty 42

## 2019-04-28 MED ORDER — SODIUM CHLORIDE 0.9% FLUSH
10.0000 mL | INTRAVENOUS | Status: DC | PRN
  Administered 2019-04-28: 13:00:00 10 mL via INTRAVENOUS
  Filled 2019-04-28: qty 10

## 2019-04-28 MED ORDER — PALONOSETRON HCL INJECTION 0.25 MG/5ML
INTRAVENOUS | Status: AC
  Filled 2019-04-28: qty 5

## 2019-04-28 MED ORDER — DOXORUBICIN HCL CHEMO IV INJECTION 2 MG/ML
50.0000 mg/m2 | Freq: Once | INTRAVENOUS | Status: AC
  Administered 2019-04-28: 84 mg via INTRAVENOUS
  Filled 2019-04-28: qty 42

## 2019-04-28 MED ORDER — SODIUM CHLORIDE 0.9 % IV SOLN
Freq: Once | INTRAVENOUS | Status: AC
  Administered 2019-04-28: 14:00:00 via INTRAVENOUS
  Filled 2019-04-28: qty 250

## 2019-04-28 MED ORDER — HEPARIN SOD (PORK) LOCK FLUSH 100 UNIT/ML IV SOLN
500.0000 [IU] | Freq: Once | INTRAVENOUS | Status: AC | PRN
  Administered 2019-04-28: 500 [IU]
  Filled 2019-04-28: qty 5

## 2019-04-28 MED ORDER — SODIUM CHLORIDE 0.9 % IV SOLN
Freq: Once | INTRAVENOUS | Status: AC
  Administered 2019-04-28: 14:00:00 via INTRAVENOUS
  Filled 2019-04-28: qty 5

## 2019-04-28 MED ORDER — PALONOSETRON HCL INJECTION 0.25 MG/5ML
0.2500 mg | Freq: Once | INTRAVENOUS | Status: AC
  Administered 2019-04-28: 0.25 mg via INTRAVENOUS

## 2019-04-28 NOTE — Progress Notes (Signed)
Patient noted to have some headache and constipation following first cycle of chemotherapy. Spoke w/ MD, was not too severe and benefit of keeping aloxi outweighs possible side effects - keep aloxi in treatment plan at this time.   Demetrius Charity, PharmD, [facility] Pharmac[facility] Phone: [phone] 04/28/2019

## 2019-04-28 NOTE — Assessment & Plan Note (Signed)
04/23/2018 palpable lump in right breast UOQ, mammogram revealed 2 separate masses. 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, margins negative, 2/3 lymph nodes positive, ER 95%, PR 0%, HER-2 negative, Ki-67 10%  Treatment plan: 1.Neoadjuvant antiestrogen therapy12/02/2018-July 2020 3.12/24/2018 Right lumpectomy: Residual invasive ductal carcinoma 3.8 cm, margins negative, 2/3 lymph nodes positive, ER 95%, PR 0%, HER-2 negative, Ki-67 10% 4.MammaPrint high risk: Adjuvant chemotherapy is being recommended with dose dense Adriamycin and Cytoxan followed by Taxol 5.adjuvant radiation therapy 5.Followed by adjuvant antiestrogen therapy ----------------------------------------------------------------------------------------------------------------------------------------------- 03/11/2019 bilateral mastectomies [name]):  Left breast: benign tissue and two negative lymph nodes Right breast: residual IDC0.5 cm , clear margins, 2 lymph nodes negative, ER 95%, PR 0%, HER-2 -1+, Ki-67 10%  Treatment plan: Adjuvant chemotherapy with dose dense Adriamycin and Cytoxan x4 followed by Taxol weekly x12 clinical trialSWOG 1714 ---------------------------------------------------------------------------------------------------------------------------------- Current Treatment:Cycle 2 dose dense Adriamycin and Cytoxan Labs reviewed Echocardiogram9/03/2019: EF 55 to 60%  Chemo toxicities: 1.  Headaches due to antinausea  drugs 2.  Constipation Denies any nausea or vomiting.  Return to clinic in 2 weeks for cycle 3

## 2019-04-28 NOTE — Patient Instructions (Signed)
[facility] Discharge Instructions for Patients Receiving Chemotherapy  Today you received the following chemotherapy agents: Adriamycin, Cytoxan  To help prevent nausea and vomiting after your treatment, we encourage you to take your nausea medication as directed.   If you develop nausea and vomiting that is not controlled by your nausea medication, call the clinic.   BELOW ARE SYMPTOMS THAT SHOULD BE REPORTED IMMEDIATELY:  *FEVER GREATER THAN 100.5 F  *CHILLS WITH OR WITHOUT FEVER  NAUSEA AND VOMITING THAT IS NOT CONTROLLED WITH YOUR NAUSEA MEDICATION  *UNUSUAL SHORTNESS OF BREATH  *UNUSUAL BRUISING OR BLEEDING  TENDERNESS IN MOUTH AND THROAT WITH OR WITHOUT PRESENCE OF ULCERS  *URINARY PROBLEMS  *BOWEL PROBLEMS  UNUSUAL RASH Items with * indicate a potential emergency and should be followed up as soon as possible.  Feel free to call the clinic should you have any questions or concerns. The clinic phone number is [phone].  Please show the CHEMO ALERT CARD at check-in to the Emergency Department and triage nurse.   

## 2019-04-30 ENCOUNTER — Inpatient Hospital Stay: Payer: Medicare Other

## 2019-04-30 ENCOUNTER — Other Ambulatory Visit: Payer: Self-pay

## 2019-04-30 VITALS — BP 143/75 | HR 81 | Temp 97.8°F | Resp 16

## 2019-04-30 DIAGNOSIS — Z9013 Acquired absence of bilateral breasts and nipples: Secondary | ICD-10-CM | POA: Diagnosis not present

## 2019-04-30 DIAGNOSIS — Z170 Estrogen receptor positive status [ER+]: Secondary | ICD-10-CM | POA: Diagnosis not present

## 2019-04-30 DIAGNOSIS — C50411 Malignant neoplasm of upper-outer quadrant of right female breast: Secondary | ICD-10-CM

## 2019-04-30 DIAGNOSIS — Z5111 Encounter for antineoplastic chemotherapy: Secondary | ICD-10-CM | POA: Diagnosis not present

## 2019-04-30 DIAGNOSIS — Z923 Personal history of irradiation: Secondary | ICD-10-CM | POA: Diagnosis not present

## 2019-04-30 DIAGNOSIS — Z79811 Long term (current) use of aromatase inhibitors: Secondary | ICD-10-CM | POA: Diagnosis not present

## 2019-04-30 MED ORDER — PEGFILGRASTIM-JMDB 6 MG/0.6ML ~~LOC~~ SOSY
6.0000 mg | PREFILLED_SYRINGE | Freq: Once | SUBCUTANEOUS | Status: AC
  Administered 2019-04-30: 6 mg via SUBCUTANEOUS

## 2019-04-30 NOTE — Patient Instructions (Signed)

## 2019-05-01 ENCOUNTER — Other Ambulatory Visit: Payer: Self-pay | Admitting: Hematology and Oncology

## 2019-05-11 NOTE — Progress Notes (Signed)
Patient Care Team: [name], MD as PCP - General (Family Medicine)  DIAGNOSIS:    ICD-10-CM   1. Malignant neoplasm of upper-outer quadrant of right breast in female, estrogen receptor positive ([place])  C50.411    Z17.0     SUMMARY OF ONCOLOGIC HISTORY: Oncology History  Malignant neoplasm of upper-outer quadrant of right breast in female, estrogen receptor positive ([place])  04/23/2018 Initial Diagnosis   Palpable lump in right breast UOQ, mammogram revealed 2 separate masses.  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  The Multi-Gene Panel offered by [facility] includes sequencing and/or deletion duplication testing of the following 85 genes: AIP, ALK, APC, ATM, AXIN2,BAP1,  BARD1, BLM, BMPR1A, BRCA1, BRCA2, BRIP1, CASR, CDC73, CDH1, CDK4, CDKN1B, CDKN1C, CDKN2A (p14ARF), CDKN2A (p16INK4a), CEBPA, CHEK2, CTNNA1, DICER1, DIS3L2, EGFR (c.2369C>T, p.Thr790Met variant only), EPCAM (Deletion/duplication testing only), FH, FLCN, GATA2, GPC3, GREM1 (Promoter region deletion/duplication testing only), HOXB13 (c.251G>A, p.Gly84Glu), HRAS, KIT, MAX, MEN1, MET, MITF (c.952G>A, p.Glu318Lys variant only), MLH1, MSH2, MSH3, MSH6, MUTYH, NBN, NF1, NF2, NTHL1, PALB2, PDGFRA, PHOX2B, PMS2, POLD1, POLE, POT1, PRKAR1A, PTCH1, PTEN, RAD50, RAD51C, RAD51D, RB1, RECQL4, RET, RNF43, RUNX1, SDHAF2, SDHA (sequence changes only), SDHB, SDHC, SDHD, SMAD4, SMARCA4, SMARCB1, SMARCE1, STK11,  SUFU, TERC, TERT, TMEM127, TP53, TSC1, TSC2, VHL, WRN and WT1.      12/24/2018 Surgery   Right lumpectomy [name]): residual invasive carcinoma, clear margins, and 2/3 lymph nodes positive for carcinoma.    01/14/2019 Cancer Staging   Staging form: Breast, AJCC 8th Edition - Pathologic stage from 01/14/2019: No Stage Recommended (ypT2, pN1a, cM0, G2, ER+, PR-, HER2-) - Signed by [name], MD on 01/14/2019   01/22/2019 Miscellaneous   MammaPrint: High risk luminal type B   03/11/2019 Surgery   Bilateral mastectomies [name]):   Left breast: benign tissue and two negative lymph nodes Right breast: residual IDC 0.5 cm, clear margins, 2 lymph nodes negativeER 95%, PR 0%, HER-2 -1+, Ki-67 10%   04/14/2019 -  Chemotherapy   The patient had DOXOrubicin (ADRIAMYCIN) chemo injection 102 mg, 60 mg/m2 = 102 mg, Intravenous,  Once, 2 of 4 cycles Dose modification: 50 mg/m2 (original dose 60 mg/m2, Cycle 2, Reason: Dose not tolerated) Administration: 102 mg (04/14/2019), 84 mg (04/28/2019) palonosetron (ALOXI) injection 0.25 mg, 0.25 mg, Intravenous,  Once, 2 of 4 cycles Administration: 0.25 mg (04/14/2019), 0.25 mg (04/28/2019) pegfilgrastim-jmdb (FULPHILA) injection 6 mg, 6 mg, Subcutaneous,  Once, 2 of 4 cycles Administration: 6 mg (04/16/2019), 6 mg (04/30/2019) cyclophosphamide (CYTOXAN) 1,020 mg in sodium chloride 0.9 % 250 mL chemo infusion, 600 mg/m2 = 1,020 mg, Intravenous,  Once, 2 of 4 cycles Dose modification: 500 mg/m2 (original dose 600 mg/m2, Cycle 2, Reason: Dose not tolerated) Administration: 1,020 mg (04/14/2019), 840 mg (04/28/2019) PACLitaxel (TAXOL) 138 mg in sodium chloride 0.9 % 250 mL chemo infusion (</= 65m/m2), 80 mg/m2 = 138 mg, Intravenous,  Once, 0 of 12 cycles fosaprepitant (EMEND) 150 mg, dexamethasone (  DECADRON) 12 mg in sodium chloride 0.9 % 145 mL IVPB, , Intravenous,  Once, 2 of 4 cycles Administration:  (04/14/2019),  (04/28/2019)  for chemotherapy treatment.       CHIEF COMPLIANT: Cycle 3Adriamycin and Cytoxan  INTERVAL HISTORY: Samantha Cross is a 73 y.o. with above-mentioned history of right breast cancer treated with neoadjuvant letrozole, lumpectomy, and for which Mammaprint testing revealed she was high risk.She underwent bilateral mastectomiesand is currently on adjuvant chemotherapy withdose dense Adriamycin and Cytoxan.She presents to the clinic today for cycle 3.   REVIEW OF SYSTEMS:   Constitutional: Denies fevers, chills or abnormal weight loss Eyes: Denies blurriness of vision Ears, nose, mouth, throat, and face: Denies mucositis or sore throat Respiratory: Denies cough, dyspnea or wheezes Cardiovascular: Denies palpitation, chest discomfort Gastrointestinal: Denies nausea, heartburn or change in bowel habits Skin: Denies abnormal skin rashes Lymphatics: Denies new lymphadenopathy or easy bruising Neurological: Denies numbness, tingling or new weaknesses Behavioral/Psych: Mood is stable, no new changes  Extremities: No lower extremity edema Breast: denies any pain or lumps or nodules in either breasts All other systems were reviewed with the patient and are negative.  I have reviewed the past medical history, past surgical history, social history and family history with the patient and they are unchanged from previous note.  ALLERGIES:  is allergic to lisinopril; alendronate [alendronate]; and hydroxyzine.  MEDICATIONS:  Current Outpatient Medications  Medication Sig Dispense Refill   amLODipine (NORVASC) 10 MG tablet TAKE 1 TABLET(10 MG) BY MOUTH DAILY 90 tablet 1   CLOBETASOL PROPIONATE E 0.05 % emollient cream APPLY EXTERNALLY TO THE AFFECTED AREA TWICE DAILY (Patient taking differently: Apply 1 application topically 2 (two) times daily as needed (rash). ) 45 g prn   dexamethasone (DECADRON) 4 MG tablet Take 1 tablet day after chemo and 1 tablet 2 days after chemo with food 30 tablet 1    HYDROcodone-acetaminophen (NORCO/VICODIN) 5-325 MG tablet Take 1-2 tablets by mouth every 6 (six) hours as needed for moderate pain. 15 tablet 0   letrozole (FEMARA) 2.5 MG tablet TAKE 1 TABLET(2.5 MG) BY MOUTH DAILY 90 tablet 0   lidocaine-prilocaine (EMLA) cream Apply to affected area once 30 g 3   LORazepam (ATIVAN) 0.5 MG tablet Take 1 tablet (0.5 mg total) by mouth every 6 (six) hours as needed (Nausea or vomiting). 30 tablet 0   methocarbamol (ROBAXIN) 500 MG tablet Take 1 tablet (500 mg total) by mouth every 6 (six) hours as needed for muscle spasms. 20 tablet 1   Misc Natural Products (ESSIAC TONIC) CAPS Take 1-2 capsules by mouth 2 (two) times daily. Vegetable     ondansetron (ZOFRAN) 8 MG tablet Take 1 tablet (8 mg total) by mouth 2 (two) times daily as needed. Start on the third day after chemotherapy. 30 tablet 1   OVER THE COUNTER MEDICATION Take 2 capsules by mouth 2 (two) times daily as needed (Supplement). [employer]     prochlorperazine (COMPAZINE) 10 MG tablet Take 1 tablet (10 mg total) by mouth every 6 (six) hours as needed (Nausea or vomiting). 30 tablet 1   simvastatin (ZOCOR) 20 MG tablet TAKE 1 TABLET BY MOUTH EVERY NIGHT AT BEDTIME 90 tablet 0   No current facility-administered medications for this visit.     PHYSICAL EXAMINATION: ECOG PERFORMANCE STATUS: 1 - Symptomatic but completely ambulatory  Vitals:   05/12/19 1154  BP: 134/76  Pulse: 82  Resp: 17  Temp: 98.7 F (37.1 C)  SpO2: 100%  Filed Weights   05/12/19 1154  Weight: 141 lb 6.4 oz (64.1 kg)    GENERAL: alert, no distress and comfortable SKIN: skin color, texture, turgor are normal, no rashes or significant lesions EYES: normal, Conjunctiva are pink and non-injected, sclera clear OROPHARYNX: no exudate, no erythema and lips, buccal mucosa, and tongue normal  NECK: supple, thyroid normal size, non-tender, without nodularity LYMPH: no palpable lymphadenopathy in the cervical,  axillary or inguinal LUNGS: clear to auscultation and percussion with normal breathing effort HEART: regular rate & rhythm and no murmurs and no lower extremity edema ABDOMEN: abdomen soft, non-tender and normal bowel sounds MUSCULOSKELETAL: no cyanosis of digits and no clubbing  NEURO: alert & oriented x 3 with fluent speech, no focal motor/sensory deficits EXTREMITIES: No lower extremity edema  LABORATORY DATA:  I have reviewed the data as listed CMP Latest Ref Rng & Units 04/28/2019 04/21/2019 04/14/2019  Glucose 70 - 99 mg/dL 94 146(H) 95  BUN 8 - 23 mg/dL _0 Creatinine 0.44 - 1.00 mg/dL 0.82 0.74 0.73  Sodium 135 - 145 mmol/L 140 141 143  Potassium 3.5 - 5.1 mmol/L 4.0 3.8 4.0  Chloride 98 - 111 mmol/L 107 104 109  CO2 22 - 32 mmol/L _1 Calcium 8.9 - 10.3 mg/dL 8.7(L) 8.7(L) 9.1  Total Protein 6.5 - 8.1 g/dL 6.9 6.5 7.6  Total Bilirubin 0.3 - 1.2 mg/dL 0.3 0.9 0.9  Alkaline Phos 38 - 126 U/L 106 90 85  AST 15 - 41 U/L 13(L) 6(L) 17  ALT 0 - 44 U/L _2 Lab Results  Component Value Date   WBC 14.8 (H) 05/12/2019   HGB 13.3 05/12/2019   HCT 41.3 05/12/2019   MCV 93.0 05/12/2019   PLT 226 05/12/2019   NEUTROABS 10.5 (H) 05/12/2019    ASSESSMENT & PLAN:  Malignant neoplasm of upper-outer quadrant of right breast in female, estrogen receptor positive ([place]) 04/23/2018 palpable lump in right breast UOQ, mammogram revealed 2 separate masses. 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,  margins negative, 2/3 lymph nodes positive, ER 95%, PR 0%, HER-2 negative, Ki-67 10%  Treatment plan: 1.Neoadjuvant antiestrogen therapy12/02/2018-July 2020 3.12/24/2018 Right lumpectomy: Residual invasive ductal carcinoma 3.8 cm, margins negative, 2/3 lymph nodes positive, ER 95%, PR 0%, HER-2 negative, Ki-67 10% 4.MammaPrint high risk: Adjuvant chemotherapy is being recommended with dose dense Adriamycin and Cytoxan followed by Taxol 5.adjuvant radiation therapy 5.Followed by adjuvant antiestrogen therapy ----------------------------------------------------------------------------------------------------------------------------------------------- 03/11/2019 bilateral mastectomies [name]):  Left breast: benign tissue and two negative lymph nodes Right breast: residual IDC0.5 cm , clear margins, 2 lymph nodes negative, ER 95%, PR 0%, HER-2 -1+, Ki-67 10%  Treatment plan: Adjuvant chemotherapy with dose dense Adriamycin and Cytoxan x4 followed by Taxol weekly x12 clinical trialSWOG 1714 ---------------------------------------------------------------------------------------------------------------------------------- Current Treatment:Cycle 3 dose dense Adriamycin and Cytoxan Labs reviewed Echocardiogram9/03/2019: EF 55 to 60%  Chemo toxicities: 1.Headaches due to antinausea drugs 2.Constipation Denies any nausea or vomiting.  Return to clinic in 2 weeks for cycle  4    No orders of the defined types were placed in this encounter.  The patient has a good understanding of the overall plan. she agrees with it. she will call with any problems that may develop before the next visit here.  [name], MD 05/12/2019  Julious Oka Dorshimer, am acting as scribe for Dr. [name].  I have reviewed the above documentation for accuracy and completeness, and I agree with the above.

## 2019-05-12 ENCOUNTER — Inpatient Hospital Stay: Payer: Medicare Other

## 2019-05-12 ENCOUNTER — Inpatient Hospital Stay: Payer: Medicare Other | Attending: Hematology and Oncology | Admitting: Hematology and Oncology

## 2019-05-12 ENCOUNTER — Encounter: Payer: Self-pay | Admitting: *Deleted

## 2019-05-12 ENCOUNTER — Other Ambulatory Visit: Payer: Self-pay

## 2019-05-12 DIAGNOSIS — Z170 Estrogen receptor positive status [ER+]: Secondary | ICD-10-CM

## 2019-05-12 DIAGNOSIS — C50411 Malignant neoplasm of upper-outer quadrant of right female breast: Secondary | ICD-10-CM

## 2019-05-12 DIAGNOSIS — Z23 Encounter for immunization: Secondary | ICD-10-CM | POA: Insufficient documentation

## 2019-05-12 DIAGNOSIS — Z7689 Persons encountering health services in other specified circumstances: Secondary | ICD-10-CM | POA: Insufficient documentation

## 2019-05-12 DIAGNOSIS — Z9013 Acquired absence of bilateral breasts and nipples: Secondary | ICD-10-CM | POA: Insufficient documentation

## 2019-05-12 DIAGNOSIS — Z79899 Other long term (current) drug therapy: Secondary | ICD-10-CM | POA: Insufficient documentation

## 2019-05-12 DIAGNOSIS — Z5111 Encounter for antineoplastic chemotherapy: Secondary | ICD-10-CM | POA: Insufficient documentation

## 2019-05-12 DIAGNOSIS — Z79811 Long term (current) use of aromatase inhibitors: Secondary | ICD-10-CM | POA: Insufficient documentation

## 2019-05-12 LAB — CBC WITH DIFFERENTIAL (CANCER CENTER ONLY)
Abs Immature Granulocytes: 1.05 10*3/uL — ABNORMAL HIGH (ref 0.00–0.07)
Basophils Absolute: 0.1 10*3/uL (ref 0.0–0.1)
Basophils Relative: 1 %
Eosinophils Absolute: 0.1 10*3/uL (ref 0.0–0.5)
Eosinophils Relative: 1 %
HCT: 41.3 % (ref 36.0–46.0)
Hemoglobin: 13.3 g/dL (ref 12.0–15.0)
Immature Granulocytes: 7 %
Lymphocytes Relative: 11 %
Lymphs Abs: 1.7 10*3/uL (ref 0.7–4.0)
MCH: 30 pg (ref 26.0–34.0)
MCHC: 32.2 g/dL (ref 30.0–36.0)
MCV: 93 fL (ref 80.0–100.0)
Monocytes Absolute: 1.4 10*3/uL — ABNORMAL HIGH (ref 0.1–1.0)
Monocytes Relative: 10 %
Neutro Abs: 10.5 10*3/uL — ABNORMAL HIGH (ref 1.7–7.7)
Neutrophils Relative %: 70 %
Platelet Count: 226 10*3/uL (ref 150–400)
RBC: 4.44 MIL/uL (ref 3.87–5.11)
RDW: 14.5 % (ref 11.5–15.5)
WBC Count: 14.8 10*3/uL — ABNORMAL HIGH (ref 4.0–10.5)
nRBC: 0.1 % (ref 0.0–0.2)

## 2019-05-12 LAB — CMP (CANCER CENTER ONLY)
ALT: 12 U/L (ref 0–44)
AST: 12 U/L — ABNORMAL LOW (ref 15–41)
Albumin: 3.7 g/dL (ref 3.5–5.0)
Alkaline Phosphatase: 111 U/L (ref 38–126)
Anion gap: 9 (ref 5–15)
BUN: 7 mg/dL — ABNORMAL LOW (ref 8–23)
CO2: 27 mmol/L (ref 22–32)
Calcium: 9 mg/dL (ref 8.9–10.3)
Chloride: 105 mmol/L (ref 98–111)
Creatinine: 0.74 mg/dL (ref 0.44–1.00)
GFR, Est AFR Am: >60 mL/min (ref >60)
GFR, Estimated: >60 mL/min (ref >60)
Glucose, Bld: 98 mg/dL (ref 70–99)
Potassium: 4.5 mmol/L (ref 3.5–5.1)
Sodium: 141 mmol/L (ref 135–145)
Total Bilirubin: 0.4 mg/dL (ref 0.3–1.2)
Total Protein: 7 g/dL (ref 6.5–8.1)

## 2019-05-12 MED ORDER — ALTEPLASE 2 MG IJ SOLR
INTRAMUSCULAR | Status: AC
  Filled 2019-05-12: qty 2

## 2019-05-12 MED ORDER — ALTEPLASE 2 MG IJ SOLR
2.0000 mg | Freq: Once | INTRAMUSCULAR | Status: AC
  Administered 2019-05-12: 2 mg
  Filled 2019-05-12: qty 2

## 2019-05-12 MED ORDER — PALONOSETRON HCL INJECTION 0.25 MG/5ML
INTRAVENOUS | Status: AC
  Filled 2019-05-12: qty 5

## 2019-05-12 MED ORDER — SODIUM CHLORIDE 0.9 % IV SOLN
Freq: Once | INTRAVENOUS | Status: AC
  Administered 2019-05-12: 13:00:00 via INTRAVENOUS
  Filled 2019-05-12: qty 250

## 2019-05-12 MED ORDER — SODIUM CHLORIDE 0.9 % IV SOLN
Freq: Once | INTRAVENOUS | Status: AC
  Administered 2019-05-12: 13:00:00 via INTRAVENOUS
  Filled 2019-05-12: qty 5

## 2019-05-12 MED ORDER — PALONOSETRON HCL INJECTION 0.25 MG/5ML
0.2500 mg | Freq: Once | INTRAVENOUS | Status: AC
  Administered 2019-05-12: 0.25 mg via INTRAVENOUS

## 2019-05-12 MED ORDER — SODIUM CHLORIDE 0.9 % IV SOLN
500.0000 mg/m2 | Freq: Once | INTRAVENOUS | Status: AC
  Administered 2019-05-12: 840 mg via INTRAVENOUS
  Filled 2019-05-12: qty 42

## 2019-05-12 MED ORDER — DOXORUBICIN HCL CHEMO IV INJECTION 2 MG/ML
50.0000 mg/m2 | Freq: Once | INTRAVENOUS | Status: AC
  Administered 2019-05-12: 84 mg via INTRAVENOUS
  Filled 2019-05-12: qty 42

## 2019-05-12 MED ORDER — SODIUM CHLORIDE 0.9% FLUSH
10.0000 mL | INTRAVENOUS | Status: DC | PRN
  Administered 2019-05-12: 10 mL
  Filled 2019-05-12: qty 10

## 2019-05-12 MED ORDER — HEPARIN SOD (PORK) LOCK FLUSH 100 UNIT/ML IV SOLN
500.0000 [IU] | Freq: Once | INTRAVENOUS | Status: AC | PRN
  Administered 2019-05-12: 500 [IU]
  Filled 2019-05-12: qty 5

## 2019-05-12 NOTE — Research (Signed)
05/12/19 at 12:42pm - The pt was into the cancer center for her ongoing chemo treatments.  The pt met with the research nurse briefly before her infusion.  The pt said that she had read over the DCP consent, and she doesn't want to participate in any clinical trials.  The pt said that she has " a lot going on now".  The pt was thanked for her time.  The research nurse will notify Dr. Lindi Adie about the pt's decision to not participate in any clinical trials.  Of note, the pt had been referred for the UpBeat and the S1714 study.  The pt previously declined the UpBeat study.   Brion Aliment RN, BSN, CCRP Clinical Research Nurse 05/12/2019 12:47 PM

## 2019-05-12 NOTE — Assessment & Plan Note (Signed)
04/23/2018 palpable lump in right breast UOQ, mammogram revealed 2 separate masses. 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, margins negative, 2/3 lymph nodes positive, ER 95%, PR 0%, HER-2 negative, Ki-67 10%  Treatment plan: 1.Neoadjuvant antiestrogen therapy12/02/2018-July 2020 3.12/24/2018 Right lumpectomy: Residual invasive ductal carcinoma 3.8 cm, margins negative, 2/3 lymph nodes positive, ER 95%, PR 0%, HER-2 negative, Ki-67 10% 4.MammaPrint high risk: Adjuvant chemotherapy is being recommended with dose dense Adriamycin and Cytoxan followed by Taxol 5.adjuvant radiation therapy 5.Followed by adjuvant antiestrogen therapy ----------------------------------------------------------------------------------------------------------------------------------------------- 03/11/2019 bilateral mastectomies Samantha Cross):  Left breast: benign tissue and two negative lymph nodes Right breast: residual IDC0.5 cm , clear margins, 2 lymph nodes negative, ER 95%, PR 0%, HER-2 -1+, Ki-67 10%  Treatment plan: Adjuvant chemotherapy with dose dense Adriamycin and Cytoxan x4 followed by Taxol weekly x12 clinical trialSWOG 1714 ---------------------------------------------------------------------------------------------------------------------------------- Current Treatment:Cycle 3 dose dense Adriamycin and Cytoxan Labs reviewed Echocardiogram9/03/2019: EF 55 to 60%  Chemo toxicities: 1.Headaches due to antinausea  drugs 2.Constipation Denies any nausea or vomiting.  Return to clinic in 2 weeks for cycle 4

## 2019-05-12 NOTE — Patient Instructions (Signed)
[facility] Discharge Instructions for Patients Receiving Chemotherapy  Today you received the following chemotherapy agents: Adriamycin, Cytoxan  To help prevent nausea and vomiting after your treatment, we encourage you to take your nausea medication as directed.   If you develop nausea and vomiting that is not controlled by your nausea medication, call the clinic.   BELOW ARE SYMPTOMS THAT SHOULD BE REPORTED IMMEDIATELY:  *FEVER GREATER THAN 100.5 F  *CHILLS WITH OR WITHOUT FEVER  NAUSEA AND VOMITING THAT IS NOT CONTROLLED WITH YOUR NAUSEA MEDICATION  *UNUSUAL SHORTNESS OF BREATH  *UNUSUAL BRUISING OR BLEEDING  TENDERNESS IN MOUTH AND THROAT WITH OR WITHOUT PRESENCE OF ULCERS  *URINARY PROBLEMS  *BOWEL PROBLEMS  UNUSUAL RASH Items with * indicate a potential emergency and should be followed up as soon as possible.  Feel free to call the clinic should you have any questions or concerns. The clinic phone number is [phone].  Please show the CHEMO ALERT CARD at check-in to the Emergency Department and triage nurse.   

## 2019-05-14 ENCOUNTER — Inpatient Hospital Stay: Payer: Medicare Other

## 2019-05-14 ENCOUNTER — Other Ambulatory Visit: Payer: Self-pay

## 2019-05-14 VITALS — BP 118/71 | HR 71 | Temp 97.9°F | Resp 20

## 2019-05-14 DIAGNOSIS — C50411 Malignant neoplasm of upper-outer quadrant of right female breast: Secondary | ICD-10-CM

## 2019-05-14 DIAGNOSIS — Z5111 Encounter for antineoplastic chemotherapy: Secondary | ICD-10-CM | POA: Diagnosis not present

## 2019-05-14 MED ORDER — PEGFILGRASTIM-JMDB 6 MG/0.6ML ~~LOC~~ SOSY
6.0000 mg | PREFILLED_SYRINGE | Freq: Once | SUBCUTANEOUS | Status: AC
  Administered 2019-05-14: 6 mg via SUBCUTANEOUS

## 2019-05-14 MED ORDER — PEGFILGRASTIM-JMDB 6 MG/0.6ML ~~LOC~~ SOSY
PREFILLED_SYRINGE | SUBCUTANEOUS | Status: AC
  Filled 2019-05-14: qty 0.6

## 2019-05-14 NOTE — Patient Instructions (Signed)

## 2019-05-25 NOTE — Progress Notes (Signed)
Patient Care Team: [name], MD as PCP - General (Family Medicine)  DIAGNOSIS:    ICD-10-CM   1. Malignant neoplasm of upper-outer quadrant of right breast in female, estrogen receptor positive ([place])  C50.411    Z17.0     SUMMARY OF ONCOLOGIC HISTORY: Oncology History  Malignant neoplasm of upper-outer quadrant of right breast in female, estrogen receptor positive ([place])  04/23/2018 Initial Diagnosis   Palpable lump in right breast UOQ, mammogram revealed 2 separate masses.  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  The Multi-Gene Panel offered by [facility] includes sequencing and/or deletion duplication testing of the following 85 genes: AIP, ALK, APC, ATM, AXIN2,BAP1,  BARD1, BLM, BMPR1A, BRCA1, BRCA2, BRIP1, CASR, CDC73, CDH1, CDK4, CDKN1B, CDKN1C, CDKN2A (p14ARF), CDKN2A (p16INK4a), CEBPA, CHEK2, CTNNA1, DICER1, DIS3L2, EGFR (c.2369C>T, p.Thr790Met variant only), EPCAM (Deletion/duplication testing only), FH, FLCN, GATA2, GPC3, GREM1 (Promoter region deletion/duplication testing only), HOXB13 (c.251G>A, p.Gly84Glu), HRAS, KIT, MAX, MEN1, MET, MITF (c.952G>A, p.Glu318Lys variant only), MLH1, MSH2, MSH3, MSH6, MUTYH, NBN, NF1, NF2, NTHL1, PALB2, PDGFRA, PHOX2B, PMS2, POLD1, POLE, POT1, PRKAR1A, PTCH1, PTEN, RAD50, RAD51C, RAD51D, RB1, RECQL4, RET, RNF43, RUNX1, SDHAF2, SDHA (sequence changes only), SDHB, SDHC, SDHD, SMAD4, SMARCA4, SMARCB1, SMARCE1, STK11,  SUFU, TERC, TERT, TMEM127, TP53, TSC1, TSC2, VHL, WRN and WT1.      12/24/2018 Surgery   Right lumpectomy [name]): residual invasive carcinoma, clear margins, and 2/3 lymph nodes positive for carcinoma.    01/14/2019 Cancer Staging   Staging form: Breast, AJCC 8th Edition - Pathologic stage from 01/14/2019: No Stage Recommended (ypT2, pN1a, cM0, G2, ER+, PR-, HER2-) - Signed by [name], MD on 01/14/2019   01/22/2019 Miscellaneous   MammaPrint: High risk luminal type B   03/11/2019 Surgery   Bilateral mastectomies [name]):   Left breast: benign tissue and two negative lymph nodes Right breast: residual IDC 0.5 cm, clear margins, 2 lymph nodes negativeER 95%, PR 0%, HER-2 -1+, Ki-67 10%   04/14/2019 -  Chemotherapy   The patient had DOXOrubicin (ADRIAMYCIN) chemo injection 102 mg, 60 mg/m2 = 102 mg, Intravenous,  Once, 3 of 4 cycles Dose modification: 50 mg/m2 (original dose 60 mg/m2, Cycle 2, Reason: Dose not tolerated) Administration: 102 mg (04/14/2019), 84 mg (04/28/2019), 84 mg (05/12/2019) palonosetron (ALOXI) injection 0.25 mg, 0.25 mg, Intravenous,  Once, 3 of 4 cycles Administration: 0.25 mg (04/14/2019), 0.25 mg (04/28/2019), 0.25 mg (05/12/2019) pegfilgrastim-jmdb (FULPHILA) injection 6 mg, 6 mg, Subcutaneous,  Once, 3 of 4 cycles Administration: 6 mg (04/16/2019), 6 mg (04/30/2019), 6 mg (05/14/2019) cyclophosphamide (CYTOXAN) 1,020 mg in sodium chloride 0.9 % 250 mL chemo infusion, 600 mg/m2 = 1,020 mg, Intravenous,  Once, 3 of 4 cycles Dose modification: 500 mg/m2 (original dose 600 mg/m2, Cycle 2, Reason: Dose not tolerated) Administration: 1,020 mg (04/14/2019), 840 mg (04/28/2019), 840 mg (05/12/2019) PACLitaxel (TAXOL) 138 mg in sodium chloride 0.9 % 250 mL chemo infusion (</= 53m/m2), 80 mg/m2 = 138 mg,  Intravenous,  Once, 0 of 12 cycles fosaprepitant (EMEND) 150 mg, dexamethasone (DECADRON) 12 mg in sodium chloride 0.9 % 145 mL IVPB, , Intravenous,  Once, 3 of 4  cycles Administration:  (04/14/2019),  (04/28/2019),  (05/12/2019)  for chemotherapy treatment.      CHIEF COMPLIANT: Cycle4Adriamycin and Cytoxan  INTERVAL HISTORY: Samantha Cross is a 73 y.o. with above-mentioned history of right breast cancer treated with neoadjuvant letrozole, lumpectomy, and for which Mammaprint testing revealed she was high risk.She underwent bilateral mastectomiesand is currently on adjuvant chemotherapy withdose dense Adriamycin and Cytoxan.She presents to the clinic today forcycle4.  After the injection she feels tired and achy for couple of days but after that she has been feeling very well.  Denies any nausea or vomiting.  REVIEW OF SYSTEMS:   Constitutional: Denies fevers, chills or abnormal weight loss Eyes: Denies blurriness of vision Ears, nose, mouth, throat, and face: Denies mucositis or sore throat Respiratory: Denies cough, dyspnea or wheezes Cardiovascular: Denies palpitation, chest discomfort Gastrointestinal: Denies nausea, heartburn or change in bowel habits Skin: Denies abnormal skin rashes Lymphatics: Denies new lymphadenopathy or easy bruising Neurological: Denies numbness, tingling or new weaknesses Behavioral/Psych: Mood is stable, no new changes  Extremities: No lower extremity edema Breast: denies any pain or lumps or nodules in either breasts All other systems were reviewed with the patient and are negative.  I have reviewed the past medical history, past surgical history, social history and family history with the patient and they are unchanged from previous note.  ALLERGIES:  is allergic to lisinopril; alendronate [alendronate]; and hydroxyzine.  MEDICATIONS:  Current Outpatient Medications  Medication Sig Dispense Refill  . amLODipine (NORVASC) 10 MG tablet TAKE 1 TABLET(10 MG) BY MOUTH DAILY 90 tablet 1  . CLOBETASOL PROPIONATE E 0.05 % emollient cream APPLY EXTERNALLY TO THE AFFECTED AREA TWICE DAILY (Patient  taking differently: Apply 1 application topically 2 (two) times daily as needed (rash). ) 45 g prn  . dexamethasone (DECADRON) 4 MG tablet Take 1 tablet day after chemo and 1 tablet 2 days after chemo with food 30 tablet 1  . HYDROcodone-acetaminophen (NORCO/VICODIN) 5-325 MG tablet Take 1-2 tablets by mouth every 6 (six) hours as needed for moderate pain. 15 tablet 0  . letrozole (FEMARA) 2.5 MG tablet TAKE 1 TABLET(2.5 MG) BY MOUTH DAILY 90 tablet 0  . lidocaine-prilocaine (EMLA) cream Apply to affected area once 30 g 3  . LORazepam (ATIVAN) 0.5 MG tablet Take 1 tablet (0.5 mg total) by mouth every 6 (six) hours as needed (Nausea or vomiting). 30 tablet 0  . methocarbamol (ROBAXIN) 500 MG tablet Take 1 tablet (500 mg total) by mouth every 6 (six) hours as needed for muscle spasms. 20 tablet 1  . Misc Natural Products (ESSIAC TONIC) CAPS Take 1-2 capsules by mouth 2 (two) times daily. Vegetable    . ondansetron (ZOFRAN) 8 MG tablet Take 1 tablet (8 mg total) by mouth 2 (two) times daily as needed. Start on the third day after chemotherapy. 30 tablet 1  . OVER THE COUNTER MEDICATION Take 2 capsules by mouth 2 (two) times daily as needed (Supplement). [employer]    . prochlorperazine (COMPAZINE) 10 MG tablet Take 1 tablet (10 mg total) by mouth every 6 (six) hours as needed (Nausea or vomiting). 30 tablet 1  . simvastatin (ZOCOR) 20 MG tablet TAKE 1 TABLET BY MOUTH EVERY NIGHT AT BEDTIME 90 tablet 0   No current facility-administered medications for this visit.  PHYSICAL EXAMINATION: ECOG PERFORMANCE STATUS: 1 - Symptomatic but completely ambulatory  Vitals:   05/26/19 1037  BP: 129/71  Pulse: 79  Resp: 18  Temp: 98.3 F (36.8 C)  SpO2: 100%   Filed Weights   05/26/19 1037  Weight: 138 lb 11.2 oz (62.9 kg)    GENERAL: alert, no distress and comfortable SKIN: skin color, texture, turgor are normal, no rashes or significant lesions EYES: normal, Conjunctiva are pink and  non-injected, sclera clear OROPHARYNX: no exudate, no erythema and lips, buccal mucosa, and tongue normal  NECK: supple, thyroid normal size, non-tender, without nodularity LYMPH: no palpable lymphadenopathy in the cervical, axillary or inguinal LUNGS: clear to auscultation and percussion with normal breathing effort HEART: regular rate & rhythm and no murmurs and no lower extremity edema ABDOMEN: abdomen soft, non-tender and normal bowel sounds MUSCULOSKELETAL: no cyanosis of digits and no clubbing  NEURO: alert & oriented x 3 with fluent speech, no focal motor/sensory deficits EXTREMITIES: No lower extremity edema  LABORATORY DATA:  I have reviewed the data as listed CMP Latest Ref Rng & Units 05/12/2019 04/28/2019 04/21/2019  Glucose 70 - 99 mg/dL 98 94 146(H)  BUN 8 - 23 mg/dL 7(L) 9 11  Creatinine 0.44 - 1.00 mg/dL 0.74 0.82 0.74  Sodium 135 - 145 mmol/L 141 140 141  Potassium 3.5 - 5.1 mmol/L 4.5 4.0 3.8  Chloride 98 - 111 mmol/L 105 107 104  CO2 22 - 32 mmol/L '27 24 26  ' Calcium 8.9 - 10.3 mg/dL 9.0 8.7(L) 8.7(L)  Total Protein 6.5 - 8.1 g/dL 7.0 6.9 6.5  Total Bilirubin 0.3 - 1.2 mg/dL 0.4 0.3 0.9  Alkaline Phos 38 - 126 U/L 111 106 90  AST 15 - 41 U/L 12(L) 13(L) 6(L)  ALT 0 - 44 U/L '12 15 12    ' Lab Results  Component Value Date   WBC 14.4 (H) 05/26/2019   HGB 11.8 (L) 05/26/2019   HCT 36.1 05/26/2019   MCV 92.1 05/26/2019   PLT 302 05/26/2019   NEUTROABS PENDING 05/26/2019    ASSESSMENT & PLAN:  Malignant neoplasm of upper-outer quadrant of right breast in female, estrogen receptor positive ([place]) 04/23/2018 palpable lump in right breast UOQ, mammogram revealed 2 separate masses. 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, ER 95%, PR 0%, Ki-67 10%, HER-2 negative IHC 1+ T3 N0 stage 3A  MRI breast 05/19/2018: 5.7 x 4.9 x 3.3 cm mass central right  breast, no lymph nodes 05/22/2018: CT CAP: Right renal lesion 06/06/2017: MRI abdomen: Complex cystic lesion right kidney, probably benign but requires follow-up in 6 months  Right lumpectomy: Residual invasive ductal carcinoma 3.8 cm, margins negative, 2/3 lymph nodes positive, ER 95%, PR 0%, HER-2 negative, Ki-67 10%  Treatment plan: 1.Neoadjuvant antiestrogen therapy12/02/2018-July 2020 3.12/24/2018 Right lumpectomy: Residual invasive ductal carcinoma 3.8 cm, margins negative, 2/3 lymph nodes positive, ER 95%, PR 0%, HER-2 negative, Ki-67 10% 4.MammaPrint high risk: Adjuvant chemotherapy is being recommended with dose dense Adriamycin and Cytoxan followed by Taxol 5.adjuvant radiation therapy 5.Followed by adjuvant antiestrogen therapy ----------------------------------------------------------------------------------------------------------------------------------------------- 03/11/2019 bilateral mastectomies [name]):  Left breast: benign tissue and two negative lymph nodes Right breast: residual IDC0.5 cm , clear margins, 2 lymph nodes negative, ER 95%, PR 0%, HER-2 -1+, Ki-67 10%  Treatment plan: Adjuvant chemotherapy with dose dense Adriamycin and Cytoxan x4 followed by Taxol weekly x12 clinical trialSWOG 1714 ---------------------------------------------------------------------------------------------------------------------------------- Current  Treatment:Cycle4dose dense Adriamycin and Cytoxan Labs reviewed Echocardiogram9/03/2019: EF 55 to 60%  Chemo toxicities: 1.Headaches due to antinausea drugs 2.Constipation 3.  Fatigue after chemotherapy Denies any nausea or vomiting.  Return to clinic in2 weeks for cycle 1 Taxol    No orders of the defined types were placed in this encounter.  The patient has a good understanding of the overall plan. she agrees with it. she will call with any problems that may develop before the next visit here.  [name], MD 05/26/2019  Julious Oka Dorshimer, am acting as scribe for Dr. [name].  I have reviewed the above document for accuracy and completeness, and I agree with the above.

## 2019-05-26 ENCOUNTER — Inpatient Hospital Stay (HOSPITAL_BASED_OUTPATIENT_CLINIC_OR_DEPARTMENT_OTHER): Payer: Medicare Other | Admitting: Hematology and Oncology

## 2019-05-26 ENCOUNTER — Inpatient Hospital Stay: Payer: Medicare Other

## 2019-05-26 ENCOUNTER — Other Ambulatory Visit: Payer: Self-pay

## 2019-05-26 DIAGNOSIS — C50411 Malignant neoplasm of upper-outer quadrant of right female breast: Secondary | ICD-10-CM

## 2019-05-26 DIAGNOSIS — Z95828 Presence of other vascular implants and grafts: Secondary | ICD-10-CM

## 2019-05-26 DIAGNOSIS — Z170 Estrogen receptor positive status [ER+]: Secondary | ICD-10-CM

## 2019-05-26 DIAGNOSIS — Z23 Encounter for immunization: Secondary | ICD-10-CM

## 2019-05-26 DIAGNOSIS — Z5111 Encounter for antineoplastic chemotherapy: Secondary | ICD-10-CM | POA: Diagnosis not present

## 2019-05-26 LAB — CMP (CANCER CENTER ONLY)
ALT: 8 U/L (ref 0–44)
AST: 10 U/L — ABNORMAL LOW (ref 15–41)
Albumin: 3.4 g/dL — ABNORMAL LOW (ref 3.5–5.0)
Alkaline Phosphatase: 109 U/L (ref 38–126)
Anion gap: 10 (ref 5–15)
BUN: 8 mg/dL (ref 8–23)
CO2: 24 mmol/L (ref 22–32)
Calcium: 8.8 mg/dL — ABNORMAL LOW (ref 8.9–10.3)
Chloride: 107 mmol/L (ref 98–111)
Creatinine: 0.66 mg/dL (ref 0.44–1.00)
GFR, Est AFR Am: >60 mL/min (ref >60)
GFR, Estimated: >60 mL/min (ref >60)
Glucose, Bld: 140 mg/dL — ABNORMAL HIGH (ref 70–99)
Potassium: 3.3 mmol/L — ABNORMAL LOW (ref 3.5–5.1)
Sodium: 141 mmol/L (ref 135–145)
Total Bilirubin: 0.4 mg/dL (ref 0.3–1.2)
Total Protein: 6.8 g/dL (ref 6.5–8.1)

## 2019-05-26 LAB — CBC WITH DIFFERENTIAL (CANCER CENTER ONLY)
Abs Immature Granulocytes: 0.94 10*3/uL — ABNORMAL HIGH (ref 0.00–0.07)
Basophils Absolute: 0.2 10*3/uL — ABNORMAL HIGH (ref 0.0–0.1)
Basophils Relative: 1 %
Eosinophils Absolute: 0 10*3/uL (ref 0.0–0.5)
Eosinophils Relative: 0 %
HCT: 36.1 % (ref 36.0–46.0)
Hemoglobin: 11.8 g/dL — ABNORMAL LOW (ref 12.0–15.0)
Immature Granulocytes: 7 %
Lymphocytes Relative: 6 %
Lymphs Abs: 0.9 10*3/uL (ref 0.7–4.0)
MCH: 30.1 pg (ref 26.0–34.0)
MCHC: 32.7 g/dL (ref 30.0–36.0)
MCV: 92.1 fL (ref 80.0–100.0)
Monocytes Absolute: 1.2 10*3/uL — ABNORMAL HIGH (ref 0.1–1.0)
Monocytes Relative: 8 %
Neutro Abs: 11.2 10*3/uL — ABNORMAL HIGH (ref 1.7–7.7)
Neutrophils Relative %: 78 %
Platelet Count: 302 10*3/uL (ref 150–400)
RBC: 3.92 MIL/uL (ref 3.87–5.11)
RDW: 15.3 % (ref 11.5–15.5)
WBC Count: 14.4 10*3/uL — ABNORMAL HIGH (ref 4.0–10.5)
nRBC: 0 % (ref 0.0–0.2)

## 2019-05-26 MED ORDER — SODIUM CHLORIDE 0.9 % IV SOLN
Freq: Once | INTRAVENOUS | Status: AC
  Filled 2019-05-26: qty 5

## 2019-05-26 MED ORDER — INFLUENZA VAC A&B SA ADJ QUAD 0.5 ML IM PRSY
0.5000 mL | PREFILLED_SYRINGE | Freq: Once | INTRAMUSCULAR | Status: AC
  Administered 2019-05-26: 0.5 mL via INTRAMUSCULAR

## 2019-05-26 MED ORDER — HEPARIN SOD (PORK) LOCK FLUSH 100 UNIT/ML IV SOLN
500.0000 [IU] | Freq: Once | INTRAVENOUS | Status: AC | PRN
  Administered 2019-05-26: 500 [IU]
  Filled 2019-05-26: qty 5

## 2019-05-26 MED ORDER — SODIUM CHLORIDE 0.9% FLUSH
10.0000 mL | INTRAVENOUS | Status: DC | PRN
  Administered 2019-05-26: 10 mL
  Filled 2019-05-26: qty 10

## 2019-05-26 MED ORDER — PALONOSETRON HCL INJECTION 0.25 MG/5ML
0.2500 mg | Freq: Once | INTRAVENOUS | Status: AC
  Administered 2019-05-26: 0.25 mg via INTRAVENOUS

## 2019-05-26 MED ORDER — SODIUM CHLORIDE 0.9% FLUSH
10.0000 mL | INTRAVENOUS | Status: DC | PRN
  Administered 2019-05-26: 10 mL via INTRAVENOUS
  Filled 2019-05-26: qty 10

## 2019-05-26 MED ORDER — DOXORUBICIN HCL CHEMO IV INJECTION 2 MG/ML
50.0000 mg/m2 | Freq: Once | INTRAVENOUS | Status: AC
  Administered 2019-05-26: 84 mg via INTRAVENOUS
  Filled 2019-05-26: qty 42

## 2019-05-26 MED ORDER — SODIUM CHLORIDE 0.9 % IV SOLN
Freq: Once | INTRAVENOUS | Status: AC
  Filled 2019-05-26: qty 250

## 2019-05-26 MED ORDER — INFLUENZA VAC A&B SA ADJ QUAD 0.5 ML IM PRSY
PREFILLED_SYRINGE | INTRAMUSCULAR | Status: AC
  Filled 2019-05-26: qty 0.5

## 2019-05-26 MED ORDER — SODIUM CHLORIDE 0.9 % IV SOLN
500.0000 mg/m2 | Freq: Once | INTRAVENOUS | Status: AC
  Administered 2019-05-26: 840 mg via INTRAVENOUS
  Filled 2019-05-26: qty 42

## 2019-05-26 MED ORDER — PALONOSETRON HCL INJECTION 0.25 MG/5ML
INTRAVENOUS | Status: AC
  Filled 2019-05-26: qty 5

## 2019-05-26 NOTE — Assessment & Plan Note (Signed)
04/23/2018 palpable lump in right breast UOQ, mammogram revealed 2 separate masses. 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, margins negative, 2/3 lymph nodes positive, ER 95%, PR 0%, HER-2 negative, Ki-67 10%  Treatment plan: 1.Neoadjuvant antiestrogen therapy12/02/2018-July 2020 3.12/24/2018 Right lumpectomy: Residual invasive ductal carcinoma 3.8 cm, margins negative, 2/3 lymph nodes positive, ER 95%, PR 0%, HER-2 negative, Ki-67 10% 4.MammaPrint high risk: Adjuvant chemotherapy is being recommended with dose dense Adriamycin and Cytoxan followed by Taxol 5.adjuvant radiation therapy 5.Followed by adjuvant antiestrogen therapy ----------------------------------------------------------------------------------------------------------------------------------------------- 03/11/2019 bilateral mastectomies ([place]):  Left breast: benign tissue and two negative lymph nodes Right breast: residual IDC0.5 cm , clear margins, 2 lymph nodes negative, ER 95%, PR 0%, HER-2 -1+, Ki-67 10%  Treatment plan: Adjuvant chemotherapy with dose dense Adriamycin and Cytoxan x4 followed by Taxol weekly x12 clinical trialSWOG 1714 ---------------------------------------------------------------------------------------------------------------------------------- Current Treatment:Cycle4dose dense Adriamycin and Cytoxan Labs reviewed Echocardiogram9/03/2019: EF 55 to 60%  Chemo toxicities: 1.Headaches due to antinausea  drugs 2.Constipation Denies any nausea or vomiting.  Return to clinic in2 weeks for cycle 1 Taxol 

## 2019-05-26 NOTE — Patient Instructions (Addendum)
[facility] Discharge Instructions for Patients Receiving Chemotherapy  Today you received the following chemotherapy agents: Adriamycin, Cytoxan   To help prevent nausea and vomiting after your treatment, we encourage you to take your nausea medication as directed.    If you develop nausea and vomiting that is not controlled by your nausea medication, call the clinic.   BELOW ARE SYMPTOMS THAT SHOULD BE REPORTED IMMEDIATELY:  *FEVER GREATER THAN 100.5 F  *CHILLS WITH OR WITHOUT FEVER  NAUSEA AND VOMITING THAT IS NOT CONTROLLED WITH YOUR NAUSEA MEDICATION  *UNUSUAL SHORTNESS OF BREATH  *UNUSUAL BRUISING OR BLEEDING  TENDERNESS IN MOUTH AND THROAT WITH OR WITHOUT PRESENCE OF ULCERS  *URINARY PROBLEMS  *BOWEL PROBLEMS  UNUSUAL RASH Items with * indicate a potential emergency and should be followed up as soon as possible.  Feel free to call the clinic should you have any questions or concerns. The clinic phone number is [phone].  Please show the [facility] at check-in to the Emergency Department and triage nurse.   Advance Directive  Advance directives are legal documents that let you make choices ahead of time about your health care and medical treatment in case you become unable to communicate for yourself. Advance directives are a way for you to communicate your wishes to family, friends, and health care providers. This can help convey your decisions about end-of-life care if you become unable to communicate. Discussing and writing advance directives should happen over time rather than all at once. Advance directives can be changed depending on your situation and what you want, even after you have signed the advance directives. If you do not have an advance directive, some states assign family decision makers to act on your behalf based on how closely you are related to them. Each state has its own laws regarding advance directives. You may want to  check with your health care provider, attorney, or state representative about the laws in your state. There are different types of advance directives, such as:  Medical power of attorney.  Living will.  Do not resuscitate (DNR) or do not attempt resuscitation (DNAR) order. Health care proxy and medical power of attorney A health care proxy, also called a health care agent, is a person who is appointed to make medical decisions for you in cases in which you are unable to make the decisions yourself. Generally, people choose someone they know well and trust to represent their preferences. Make sure to ask this person for an agreement to act as your proxy. A proxy may have to exercise judgment in the event of a medical decision for which your wishes are not known. A medical power of attorney is a legal document that names your health care proxy. Depending on the laws in your state, after the document is written, it may also need to be:  Signed.  Notarized.  Dated.  Copied.  Witnessed.  Incorporated into your medical record. You may also want to appoint someone to manage your financial affairs in a situation in which you are unable to do so. This is called a durable power of attorney for finances. It is a separate legal document from the durable power of attorney for health care. You may choose the same person or someone different from your health care proxy to act as your agent in financial matters. If you do not appoint a proxy, or if there is a concern that the proxy is not acting in your best interests,  a court-appointed guardian may be designated to act on your behalf. Living will A living will is a set of instructions documenting your wishes about medical care when you cannot express them yourself. Health care providers should keep a copy of your living will in your medical record. You may want to give a copy to family members or friends. To alert caregivers in case of an emergency, you  can place a card in your wallet to let them know that you have a living will and where they can find it. A living will is used if you become:  Terminally ill.  Incapacitated.  Unable to communicate or make decisions. Items to consider in your living will include:  The use or non-use of life-sustaining equipment, such as dialysis machines and breathing machines (ventilators).  A DNR or DNAR order, which is the instruction not to use cardiopulmonary resuscitation (CPR) if breathing or heartbeat stops.  The use or non-use of tube feeding.  Withholding of food and fluids.  Comfort (palliative) care when the goal becomes comfort rather than a cure.  Organ and tissue donation. A living will does not give instructions for distributing your money and property if you should pass away. It is recommended that you seek the advice of a lawyer when writing a will. Decisions about taxes, beneficiaries, and asset distribution will be legally binding. This process can relieve your family and friends of any concerns surrounding disputes or questions that may come up about the distribution of your assets. DNR or DNAR A DNR or DNAR order is a request not to have CPR in the event that your heart stops beating or you stop breathing. If a DNR or DNAR order has not been made and shared, a health care provider will try to help any patient whose heart has stopped or who has stopped breathing. If you plan to have surgery, talk with your health care provider about how your DNR or DNAR order will be followed if problems occur. Summary  Advance directives are the legal documents that allow you to make choices ahead of time about your health care and medical treatment in case you become unable to communicate for yourself.  The process of discussing and writing advance directives should happen over time. You can change the advance directives, even after you have signed them.  Advance directives include DNR or DNAR  orders, living wills, and designating an agent as your medical power of attorney. This information is not intended to replace advice given to you by your health care provider. Make sure you discuss any questions you have with your health care provider. Document Released: 08/28/2007 Document Revised: 06/25/2018 Document Reviewed: 04/09/2016 Elsevier Patient Education  [place].   Influenza Virus Vaccine (Flucelvax) What is this medicine? INFLUENZA VIRUS VACCINE (in floo EN zuh VAHY ruhs vak SEEN) helps to reduce the risk of getting influenza also known as the flu. The vaccine only helps protect you against some strains of the flu. This medicine may be used for other purposes; ask your health care provider or pharmacist if you have questions. COMMON BRAND NAME(S): FLUCELVAX What should I tell my health care provider before I take this medicine? They need to know if you have any of these conditions:  bleeding disorder like hemophilia  fever or infection  Guillain-Barre syndrome or other neurological problems  immune system problems  infection with the human immunodeficiency virus (HIV) or AIDS  low blood platelet counts  multiple sclerosis  an unusual  or allergic reaction to influenza virus vaccine, other medicines, foods, dyes or preservatives  pregnant or trying to get pregnant  breast-feeding How should I use this medicine? This vaccine is for injection into a muscle. It is given by a health care professional. A copy of Vaccine Information Statements will be given before each vaccination. Read this sheet carefully each time. The sheet may change frequently. Talk to your pediatrician regarding the use of this medicine in children. Special care may be needed. Overdosage: If you think you've taken too much of this medicine contact a poison control center or emergency room at once. Overdosage: If you think you have taken too much of this medicine contact a poison control  center or emergency room at once. NOTE: This medicine is only for you. Do not share this medicine with others. What if I miss a dose? This does not apply. What may interact with this medicine?  chemotherapy or radiation therapy  medicines that lower your immune system like etanercept, anakinra, infliximab, and adalimumab  medicines that treat or prevent blood clots like warfarin  phenytoin  steroid medicines like prednisone or cortisone  theophylline  vaccines This list may not describe all possible interactions. Give your health care provider a list of all the medicines, herbs, non-prescription drugs, or dietary supplements you use. Also tell them if you smoke, drink alcohol, or use illegal drugs. Some items may interact with your medicine. What should I watch for while using this medicine? Report any side effects that do not go away within 3 days to your doctor or health care professional. Call your health care provider if any unusual symptoms occur within 6 weeks of receiving this vaccine. You may still catch the flu, but the illness is not usually as bad. You cannot get the flu from the vaccine. The vaccine will not protect against colds or other illnesses that may cause fever. The vaccine is needed every year. What side effects may I notice from receiving this medicine? Side effects that you should report to your doctor or health care professional as soon as possible:  allergic reactions like skin rash, itching or hives, swelling of the face, lips, or tongue Side effects that usually do not require medical attention (Report these to your doctor or health care professional if they continue or are bothersome.):  fever  headache  muscle aches and pains  pain, tenderness, redness, or swelling at the injection site  tiredness This list may not describe all possible side effects. Call your doctor for medical advice about side effects. You may report side effects to FDA at  1-800-FDA-1088. Where should I keep my medicine? The vaccine will be given by a health care professional in a clinic, pharmacy, doctor's office, or other health care setting. You will not be given vaccine doses to store at home. NOTE: This sheet is a summary. It may not cover all possible information. If you have questions about this medicine, talk to your doctor, pharmacist, or health care provider.  2020 Elsevier/Gold Standard (2011-05-02 14:06:47)

## 2019-05-26 NOTE — Patient Instructions (Signed)

## 2019-05-28 ENCOUNTER — Inpatient Hospital Stay: Payer: Medicare Other

## 2019-05-28 ENCOUNTER — Other Ambulatory Visit: Payer: Self-pay

## 2019-05-28 VITALS — BP 125/67 | HR 79 | Temp 98.2°F | Resp 19

## 2019-05-28 DIAGNOSIS — C50411 Malignant neoplasm of upper-outer quadrant of right female breast: Secondary | ICD-10-CM

## 2019-05-28 DIAGNOSIS — Z170 Estrogen receptor positive status [ER+]: Secondary | ICD-10-CM

## 2019-05-28 DIAGNOSIS — Z5111 Encounter for antineoplastic chemotherapy: Secondary | ICD-10-CM | POA: Diagnosis not present

## 2019-05-28 MED ORDER — PEGFILGRASTIM-JMDB 6 MG/0.6ML ~~LOC~~ SOSY
6.0000 mg | PREFILLED_SYRINGE | Freq: Once | SUBCUTANEOUS | Status: AC
  Administered 2019-05-28: 6 mg via SUBCUTANEOUS

## 2019-05-28 MED ORDER — PEGFILGRASTIM-JMDB 6 MG/0.6ML ~~LOC~~ SOSY
PREFILLED_SYRINGE | SUBCUTANEOUS | Status: AC
  Filled 2019-05-28: qty 0.6

## 2019-05-28 NOTE — Patient Instructions (Signed)

## 2019-06-09 ENCOUNTER — Inpatient Hospital Stay: Payer: Medicare Other

## 2019-06-09 ENCOUNTER — Inpatient Hospital Stay: Payer: Medicare Other | Attending: Hematology and Oncology

## 2019-06-09 ENCOUNTER — Other Ambulatory Visit: Payer: Self-pay

## 2019-06-09 VITALS — BP 124/75 | HR 76 | Temp 97.9°F | Resp 20

## 2019-06-09 DIAGNOSIS — Z170 Estrogen receptor positive status [ER+]: Secondary | ICD-10-CM | POA: Insufficient documentation

## 2019-06-09 DIAGNOSIS — C50411 Malignant neoplasm of upper-outer quadrant of right female breast: Secondary | ICD-10-CM

## 2019-06-09 DIAGNOSIS — Z9013 Acquired absence of bilateral breasts and nipples: Secondary | ICD-10-CM | POA: Insufficient documentation

## 2019-06-09 DIAGNOSIS — Z79811 Long term (current) use of aromatase inhibitors: Secondary | ICD-10-CM | POA: Insufficient documentation

## 2019-06-09 DIAGNOSIS — Z95828 Presence of other vascular implants and grafts: Secondary | ICD-10-CM

## 2019-06-09 DIAGNOSIS — Z5111 Encounter for antineoplastic chemotherapy: Secondary | ICD-10-CM | POA: Insufficient documentation

## 2019-06-09 LAB — CMP (CANCER CENTER ONLY)
ALT: 7 U/L (ref 0–44)
AST: 9 U/L — ABNORMAL LOW (ref 15–41)
Albumin: 3.5 g/dL (ref 3.5–5.0)
Alkaline Phosphatase: 108 U/L (ref 38–126)
Anion gap: 11 (ref 5–15)
BUN: 5 mg/dL — ABNORMAL LOW (ref 8–23)
CO2: 24 mmol/L (ref 22–32)
Calcium: 8.7 mg/dL — ABNORMAL LOW (ref 8.9–10.3)
Chloride: 107 mmol/L (ref 98–111)
Creatinine: 0.74 mg/dL (ref 0.44–1.00)
GFR, Est AFR Am: >60 mL/min (ref >60)
GFR, Estimated: >60 mL/min (ref >60)
Glucose, Bld: 137 mg/dL — ABNORMAL HIGH (ref 70–99)
Potassium: 3.3 mmol/L — ABNORMAL LOW (ref 3.5–5.1)
Sodium: 142 mmol/L (ref 135–145)
Total Bilirubin: 0.3 mg/dL (ref 0.3–1.2)
Total Protein: 6.5 g/dL (ref 6.5–8.1)

## 2019-06-09 LAB — CBC WITH DIFFERENTIAL (CANCER CENTER ONLY)
Abs Immature Granulocytes: 0.55 10*3/uL — ABNORMAL HIGH (ref 0.00–0.07)
Basophils Absolute: 0.1 10*3/uL (ref 0.0–0.1)
Basophils Relative: 1 %
Eosinophils Absolute: 0.1 10*3/uL (ref 0.0–0.5)
Eosinophils Relative: 1 %
HCT: 34.2 % — ABNORMAL LOW (ref 36.0–46.0)
Hemoglobin: 11.1 g/dL — ABNORMAL LOW (ref 12.0–15.0)
Immature Granulocytes: 4 %
Lymphocytes Relative: 9 %
Lymphs Abs: 1.1 10*3/uL (ref 0.7–4.0)
MCH: 30 pg (ref 26.0–34.0)
MCHC: 32.5 g/dL (ref 30.0–36.0)
MCV: 92.4 fL (ref 80.0–100.0)
Monocytes Absolute: 1.2 10*3/uL — ABNORMAL HIGH (ref 0.1–1.0)
Monocytes Relative: 10 %
Neutro Abs: 9.7 10*3/uL — ABNORMAL HIGH (ref 1.7–7.7)
Neutrophils Relative %: 75 %
Platelet Count: 275 10*3/uL (ref 150–400)
RBC: 3.7 MIL/uL — ABNORMAL LOW (ref 3.87–5.11)
RDW: 16.5 % — ABNORMAL HIGH (ref 11.5–15.5)
WBC Count: 12.8 10*3/uL — ABNORMAL HIGH (ref 4.0–10.5)
nRBC: 0 % (ref 0.0–0.2)

## 2019-06-09 MED ORDER — SODIUM CHLORIDE 0.9 % IV SOLN
20.0000 mg | Freq: Once | INTRAVENOUS | Status: AC
  Administered 2019-06-09: 20 mg via INTRAVENOUS
  Filled 2019-06-09: qty 20

## 2019-06-09 MED ORDER — SODIUM CHLORIDE 0.9 % IV SOLN
80.0000 mg/m2 | Freq: Once | INTRAVENOUS | Status: AC
  Administered 2019-06-09: 138 mg via INTRAVENOUS
  Filled 2019-06-09: qty 23

## 2019-06-09 MED ORDER — DIPHENHYDRAMINE HCL 50 MG/ML IJ SOLN
50.0000 mg | Freq: Once | INTRAMUSCULAR | Status: AC
  Administered 2019-06-09: 50 mg via INTRAVENOUS

## 2019-06-09 MED ORDER — HEPARIN SOD (PORK) LOCK FLUSH 100 UNIT/ML IV SOLN
500.0000 [IU] | Freq: Once | INTRAVENOUS | Status: AC | PRN
  Administered 2019-06-09: 500 [IU]
  Filled 2019-06-09: qty 5

## 2019-06-09 MED ORDER — SODIUM CHLORIDE 0.9% FLUSH
10.0000 mL | INTRAVENOUS | Status: DC | PRN
  Administered 2019-06-09: 10 mL via INTRAVENOUS
  Filled 2019-06-09: qty 10

## 2019-06-09 MED ORDER — DIPHENHYDRAMINE HCL 50 MG/ML IJ SOLN
INTRAMUSCULAR | Status: AC
  Filled 2019-06-09: qty 1

## 2019-06-09 MED ORDER — SODIUM CHLORIDE 0.9% FLUSH
10.0000 mL | INTRAVENOUS | Status: DC | PRN
  Administered 2019-06-09: 10 mL
  Filled 2019-06-09: qty 10

## 2019-06-09 MED ORDER — SODIUM CHLORIDE 0.9 % IV SOLN
Freq: Once | INTRAVENOUS | Status: AC
  Filled 2019-06-09: qty 250

## 2019-06-09 MED ORDER — FAMOTIDINE IN NACL 20-0.9 MG/50ML-% IV SOLN
INTRAVENOUS | Status: AC
  Filled 2019-06-09: qty 50

## 2019-06-09 MED ORDER — FAMOTIDINE IN NACL 20-0.9 MG/50ML-% IV SOLN
20.0000 mg | Freq: Once | INTRAVENOUS | Status: AC
  Administered 2019-06-09: 20 mg via INTRAVENOUS

## 2019-06-09 NOTE — Patient Instructions (Signed)

## 2019-06-09 NOTE — Patient Instructions (Signed)
Paclitaxel injection What is this medicine? PACLITAXEL (PAK li TAX el) is a chemotherapy drug. It targets fast dividing cells, like cancer cells, and causes these cells to die. This medicine is used to treat ovarian cancer, breast cancer, lung cancer, Kaposi's sarcoma, and other cancers. This medicine may be used for other purposes; ask your health care provider or pharmacist if you have questions. COMMON BRAND NAME(S): Onxol, Taxol What should I tell my health care provider before I take this medicine? They need to know if you have any of these conditions:  history of irregular heartbeat  liver disease  low blood counts, like low white cell, platelet, or red cell counts  lung or breathing disease, like asthma  tingling of the fingers or toes, or other nerve disorder  an unusual or allergic reaction to paclitaxel, alcohol, polyoxyethylated castor oil, other chemotherapy, other medicines, foods, dyes, or preservatives  pregnant or trying to get pregnant  breast-feeding How should I use this medicine? This drug is given as an infusion into a vein. It is administered in a hospital or clinic by a specially trained health care professional. Talk to your pediatrician regarding the use of this medicine in children. Special care may be needed. Overdosage: If you think you have taken too much of this medicine contact a poison control center or emergency room at once. NOTE: This medicine is only for you. Do not share this medicine with others. What if I miss a dose? It is important not to miss your dose. Call your doctor or health care professional if you are unable to keep an appointment. What may interact with this medicine? Do not take this medicine with any of the following medications:  disulfiram  metronidazole This medicine may also interact with the following medications:  antiviral medicines for hepatitis, HIV or AIDS  certain antibiotics like erythromycin and  clarithromycin  certain medicines for fungal infections like ketoconazole and itraconazole  certain medicines for seizures like carbamazepine, phenobarbital, phenytoin  gemfibrozil  nefazodone  rifampin  St. John's wort This list may not describe all possible interactions. Give your health care provider a list of all the medicines, herbs, non-prescription drugs, or dietary supplements you use. Also tell them if you smoke, drink alcohol, or use illegal drugs. Some items may interact with your medicine. What should I watch for while using this medicine? Your condition will be monitored carefully while you are receiving this medicine. You will need important blood work done while you are taking this medicine. This medicine can cause serious allergic reactions. To reduce your risk you will need to take other medicine(s) before treatment with this medicine. If you experience allergic reactions like skin rash, itching or hives, swelling of the face, lips, or tongue, tell your doctor or health care professional right away. In some cases, you may be given additional medicines to help with side effects. Follow all directions for their use. This drug may make you feel generally unwell. This is not uncommon, as chemotherapy can affect healthy cells as well as cancer cells. Report any side effects. Continue your course of treatment even though you feel ill unless your doctor tells you to stop. Call your doctor or health care professional for advice if you get a fever, chills or sore throat, or other symptoms of a cold or flu. Do not treat yourself. This drug decreases your body's ability to fight infections. Try to avoid being around people who are sick. This medicine may increase your risk to bruise   or bleed. Call your doctor or health care professional if you notice any unusual bleeding. Be careful brushing and flossing your teeth or using a toothpick because you may get an infection or bleed more easily.  If you have any dental work done, tell your dentist you are receiving this medicine. Avoid taking products that contain aspirin, acetaminophen, ibuprofen, naproxen, or ketoprofen unless instructed by your doctor. These medicines may hide a fever. Do not become pregnant while taking this medicine. Women should inform their doctor if they wish to become pregnant or think they might be pregnant. There is a potential for serious side effects to an unborn child. Talk to your health care professional or pharmacist for more information. Do not breast-feed an infant while taking this medicine. Men are advised not to father a child while receiving this medicine. This product may contain alcohol. Ask your pharmacist or healthcare provider if this medicine contains alcohol. Be sure to tell all healthcare providers you are taking this medicine. Certain medicines, like metronidazole and disulfiram, can cause an unpleasant reaction when taken with alcohol. The reaction includes flushing, headache, nausea, vomiting, sweating, and increased thirst. The reaction can last from 30 minutes to several hours. What side effects may I notice from receiving this medicine? Side effects that you should report to your doctor or health care professional as soon as possible:  allergic reactions like skin rash, itching or hives, swelling of the face, lips, or tongue  breathing problems  changes in vision  fast, irregular heartbeat  high or low blood pressure  mouth sores  pain, tingling, numbness in the hands or feet  signs of decreased platelets or bleeding - bruising, pinpoint red spots on the skin, black, tarry stools, blood in the urine  signs of decreased red blood cells - unusually weak or tired, feeling faint or lightheaded, falls  signs of infection - fever or chills, cough, sore throat, pain or difficulty passing urine  signs and symptoms of liver injury like dark yellow or brown urine; general ill feeling or  flu-like symptoms; light-colored stools; loss of appetite; nausea; right upper belly pain; unusually weak or tired; yellowing of the eyes or skin  swelling of the ankles, feet, hands  unusually slow heartbeat Side effects that usually do not require medical attention (report to your doctor or health care professional if they continue or are bothersome):  diarrhea  hair loss  loss of appetite  muscle or joint pain  nausea, vomiting  pain, redness, or irritation at site where injected  tiredness This list may not describe all possible side effects. Call your doctor for medical advice about side effects. You may report side effects to FDA at 1-800-FDA-1088. Where should I keep my medicine? This drug is given in a hospital or clinic and will not be stored at home. NOTE: This sheet is a summary. It may not cover all possible information. If you have questions about this medicine, talk to your doctor, pharmacist, or health care provider.  2020 Elsevier/Gold Standard (2017-01-22 13:14:55)  

## 2019-06-09 NOTE — Progress Notes (Signed)
First time Taxol. Pt tolerated well.

## 2019-06-16 ENCOUNTER — Inpatient Hospital Stay: Payer: Medicare Other

## 2019-06-16 ENCOUNTER — Other Ambulatory Visit: Payer: Self-pay

## 2019-06-16 VITALS — BP 105/70 | HR 74 | Temp 98.5°F | Resp 18

## 2019-06-16 DIAGNOSIS — Z170 Estrogen receptor positive status [ER+]: Secondary | ICD-10-CM

## 2019-06-16 DIAGNOSIS — Z95828 Presence of other vascular implants and grafts: Secondary | ICD-10-CM

## 2019-06-16 DIAGNOSIS — Z9013 Acquired absence of bilateral breasts and nipples: Secondary | ICD-10-CM | POA: Diagnosis not present

## 2019-06-16 DIAGNOSIS — Z5111 Encounter for antineoplastic chemotherapy: Secondary | ICD-10-CM | POA: Diagnosis not present

## 2019-06-16 DIAGNOSIS — C50411 Malignant neoplasm of upper-outer quadrant of right female breast: Secondary | ICD-10-CM

## 2019-06-16 DIAGNOSIS — Z79811 Long term (current) use of aromatase inhibitors: Secondary | ICD-10-CM | POA: Diagnosis not present

## 2019-06-16 LAB — CBC WITH DIFFERENTIAL (CANCER CENTER ONLY)
Abs Immature Granulocytes: 0.03 K/uL (ref 0.00–0.07)
Basophils Absolute: 0.1 K/uL (ref 0.0–0.1)
Basophils Relative: 1 %
Eosinophils Absolute: 0.1 K/uL (ref 0.0–0.5)
Eosinophils Relative: 2 %
HCT: 33.2 % — ABNORMAL LOW (ref 36.0–46.0)
Hemoglobin: 11 g/dL — ABNORMAL LOW (ref 12.0–15.0)
Immature Granulocytes: 1 %
Lymphocytes Relative: 12 %
Lymphs Abs: 0.8 K/uL (ref 0.7–4.0)
MCH: 30.2 pg (ref 26.0–34.0)
MCHC: 33.1 g/dL (ref 30.0–36.0)
MCV: 91.2 fL (ref 80.0–100.0)
Monocytes Absolute: 0.9 K/uL (ref 0.1–1.0)
Monocytes Relative: 14 %
Neutro Abs: 4.3 K/uL (ref 1.7–7.7)
Neutrophils Relative %: 70 %
Platelet Count: 331 K/uL (ref 150–400)
RBC: 3.64 MIL/uL — ABNORMAL LOW (ref 3.87–5.11)
RDW: 16.6 % — ABNORMAL HIGH (ref 11.5–15.5)
WBC Count: 6.2 K/uL (ref 4.0–10.5)
nRBC: 0 % (ref 0.0–0.2)

## 2019-06-16 LAB — CMP (CANCER CENTER ONLY)
ALT: 14 U/L (ref 0–44)
AST: 14 U/L — ABNORMAL LOW (ref 15–41)
Albumin: 3.7 g/dL (ref 3.5–5.0)
Alkaline Phosphatase: 80 U/L (ref 38–126)
Anion gap: 10 (ref 5–15)
BUN: 9 mg/dL (ref 8–23)
CO2: 25 mmol/L (ref 22–32)
Calcium: 8.6 mg/dL — ABNORMAL LOW (ref 8.9–10.3)
Chloride: 107 mmol/L (ref 98–111)
Creatinine: 0.67 mg/dL (ref 0.44–1.00)
GFR, Est AFR Am: >60 mL/min (ref >60)
GFR, Estimated: >60 mL/min (ref >60)
Glucose, Bld: 132 mg/dL — ABNORMAL HIGH (ref 70–99)
Potassium: 3.4 mmol/L — ABNORMAL LOW (ref 3.5–5.1)
Sodium: 142 mmol/L (ref 135–145)
Total Bilirubin: 0.5 mg/dL (ref 0.3–1.2)
Total Protein: 6.7 g/dL (ref 6.5–8.1)

## 2019-06-16 MED ORDER — DIPHENHYDRAMINE HCL 50 MG/ML IJ SOLN
50.0000 mg | Freq: Once | INTRAMUSCULAR | Status: AC
  Administered 2019-06-16: 50 mg via INTRAVENOUS

## 2019-06-16 MED ORDER — FAMOTIDINE IN NACL 20-0.9 MG/50ML-% IV SOLN
20.0000 mg | Freq: Once | INTRAVENOUS | Status: AC
  Administered 2019-06-16: 20 mg via INTRAVENOUS

## 2019-06-16 MED ORDER — SODIUM CHLORIDE 0.9 % IV SOLN
20.0000 mg | Freq: Once | INTRAVENOUS | Status: AC
  Administered 2019-06-16: 20 mg via INTRAVENOUS
  Filled 2019-06-16: qty 20

## 2019-06-16 MED ORDER — SODIUM CHLORIDE 0.9 % IV SOLN
65.0000 mg/m2 | Freq: Once | INTRAVENOUS | Status: AC
  Administered 2019-06-16: 108 mg via INTRAVENOUS
  Filled 2019-06-16: qty 18

## 2019-06-16 MED ORDER — HEPARIN SOD (PORK) LOCK FLUSH 100 UNIT/ML IV SOLN
500.0000 [IU] | Freq: Once | INTRAVENOUS | Status: AC | PRN
  Administered 2019-06-16: 500 [IU]
  Filled 2019-06-16: qty 5

## 2019-06-16 MED ORDER — SODIUM CHLORIDE 0.9 % IV SOLN
Freq: Once | INTRAVENOUS | Status: AC
  Filled 2019-06-16: qty 250

## 2019-06-16 MED ORDER — DIPHENHYDRAMINE HCL 50 MG/ML IJ SOLN
INTRAMUSCULAR | Status: AC
  Filled 2019-06-16: qty 1

## 2019-06-16 MED ORDER — SODIUM CHLORIDE 0.9 % IV SOLN: 80.0000 mg/m2 | Freq: Once | INTRAVENOUS | Status: DC

## 2019-06-16 MED ORDER — SODIUM CHLORIDE 0.9% FLUSH
10.0000 mL | INTRAVENOUS | Status: DC | PRN
  Administered 2019-06-16: 10 mL
  Filled 2019-06-16: qty 10

## 2019-06-16 MED ORDER — FAMOTIDINE IN NACL 20-0.9 MG/50ML-% IV SOLN
INTRAVENOUS | Status: AC
  Filled 2019-06-16: qty 50

## 2019-06-16 MED ORDER — SODIUM CHLORIDE 0.9% FLUSH
10.0000 mL | INTRAVENOUS | Status: DC | PRN
  Filled 2019-06-16: qty 10

## 2019-06-16 NOTE — Progress Notes (Signed)
Patient presented to treatment c/o fingertip "soreness" on all fingers started since last treatment. Worse x 3 days. Visual assessment revealed no abnormalities. Dr. Lindi Adie aware. Will dose-reduce today to 65mg /m2. Demetrius Charity (pharmacist) to contact Dr. Lindi Adie for order.

## 2019-06-16 NOTE — Progress Notes (Signed)
Spoke w/ Dr. Lindi Adie, decrease paclitaxel dose to 65 mg/m2 today and for future treatments.   Demetrius Charity, PharmD, [facility] Pharmacist Pharmacy Phone: [phone] 06/16/2019

## 2019-06-16 NOTE — Patient Instructions (Signed)
[facility] Discharge Instructions for Patients Receiving Chemotherapy  Today you received the following chemotherapy agents: paclitaxel.  To help prevent nausea and vomiting after your treatment, we encourage you to take your nausea medication as directed.   If you develop nausea and vomiting that is not controlled by your nausea medication, call the clinic.   BELOW ARE SYMPTOMS THAT SHOULD BE REPORTED IMMEDIATELY:  *FEVER GREATER THAN 100.5 F  *CHILLS WITH OR WITHOUT FEVER  NAUSEA AND VOMITING THAT IS NOT CONTROLLED WITH YOUR NAUSEA MEDICATION  *UNUSUAL SHORTNESS OF BREATH  *UNUSUAL BRUISING OR BLEEDING  TENDERNESS IN MOUTH AND THROAT WITH OR WITHOUT PRESENCE OF ULCERS  *URINARY PROBLEMS  *BOWEL PROBLEMS  UNUSUAL RASH Items with * indicate a potential emergency and should be followed up as soon as possible.  Feel free to call the clinic should you have any questions or concerns. The clinic phone number is [phone].  Please show the CHEMO ALERT CARD at check-in to the Emergency Department and triage nurse.   

## 2019-06-22 ENCOUNTER — Encounter: Payer: Self-pay | Admitting: *Deleted

## 2019-06-22 NOTE — Progress Notes (Signed)
Patient Care Team: Samantha Marry, MD as PCP - General (Family Medicine) Samantha Germany, RN as Oncology Nurse Navigator Samantha Kaufmann, RN as Oncology Nurse Navigator  DIAGNOSIS:    ICD-10-CM   1. Malignant neoplasm of upper-outer quadrant of right breast in female, estrogen receptor positive ([place])  C50.411    Z17.0     SUMMARY OF ONCOLOGIC HISTORY: Oncology History  Malignant neoplasm of upper-outer quadrant of right breast in female, estrogen receptor positive ([place])  04/23/2018 Initial Diagnosis   Palpable lump in right breast UOQ, mammogram revealed 2 separate masses.  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  The Multi-Gene Panel offered by [facility] includes sequencing and/or deletion duplication testing of the following 85 genes: AIP, ALK, APC, ATM, AXIN2,BAP1,  BARD1, BLM, BMPR1A, BRCA1, BRCA2, BRIP1, CASR, CDC73, CDH1, CDK4, CDKN1B, CDKN1C, CDKN2A (p14ARF), CDKN2A (p16INK4a), CEBPA, CHEK2, CTNNA1, DICER1, DIS3L2, EGFR (c.2369C>T, p.Thr790Met variant only), EPCAM (Deletion/duplication testing only), FH, FLCN, GATA2, GPC3, GREM1 (Promoter region deletion/duplication testing only), HOXB13 (c.251G>A, p.Gly84Glu), HRAS, KIT, MAX, MEN1, MET, MITF (c.952G>A, p.Glu318Lys variant only), MLH1, MSH2, MSH3, MSH6, MUTYH, NBN, NF1, NF2, NTHL1, PALB2, PDGFRA, PHOX2B, PMS2, POLD1, POLE, POT1, PRKAR1A, PTCH1, PTEN, RAD50, RAD51C, RAD51D, RB1, RECQL4, RET, RNF43, RUNX1,  SDHAF2, SDHA (sequence changes only), SDHB, SDHC, SDHD, SMAD4, SMARCA4, SMARCB1, SMARCE1, STK11, SUFU, TERC, TERT, TMEM127, TP53, TSC1, TSC2, VHL, WRN and WT1.      12/24/2018 Surgery   Right lumpectomy Samantha Cross): residual invasive carcinoma, clear margins, and 2/3 lymph nodes positive for carcinoma.    01/14/2019 Cancer Staging   Staging form: Breast, AJCC 8th Edition - Pathologic stage from 01/14/2019: No Stage Recommended (ypT2, pN1a, cM0, G2, ER+, PR-, HER2-) - Signed by Samantha Gibson, MD on 01/14/2019   01/22/2019 Miscellaneous   MammaPrint: High risk luminal type B   03/11/2019 Surgery   Bilateral mastectomies Samantha Cross):   Left breast: benign tissue and two negative lymph nodes Right breast: residual IDC 0.5 cm, clear margins, 2 lymph nodes negativeER 95%, PR 0%, HER-2 -1+, Ki-67 10%   04/14/2019 -  Chemotherapy   The patient had DOXOrubicin (ADRIAMYCIN) chemo injection 102 mg, 60 mg/m2 = 102 mg, Intravenous,  Once, 4 of 4 cycles Dose modification: 50 mg/m2 (original dose 60 mg/m2, Cycle 2, Reason: Dose not tolerated) Administration: 102 mg (04/14/2019), 84 mg (04/28/2019), 84 mg (05/12/2019), 84 mg (05/26/2019) palonosetron (ALOXI) injection 0.25 mg, 0.25 mg, Intravenous,  Once, 4 of 4 cycles Administration: 0.25 mg (04/14/2019), 0.25 mg (04/28/2019), 0.25 mg (05/12/2019), 0.25 mg (05/26/2019) pegfilgrastim-jmdb (FULPHILA) injection 6 mg, 6 mg, Subcutaneous,  Once, 4 of 4 cycles Administration: 6 mg (04/16/2019), 6 mg (04/30/2019), 6 mg (05/14/2019), 6 mg (05/28/2019) cyclophosphamide (CYTOXAN) 1,020 mg in sodium chloride 0.9 % 250 mL chemo infusion, 600 mg/m2 = 1,020 mg, Intravenous,  Once, 4 of 4 cycles Dose modification: 500 mg/m2 (original dose 600 mg/m2, Cycle 2, Reason: Dose not tolerated) Administration: 1,020 mg (04/14/2019), 840  mg (04/28/2019), 840 mg (05/12/2019), 840 mg (05/26/2019) PACLitaxel (TAXOL) 138 mg in sodium chloride 0.9 % 250 mL chemo infusion (</= 41m/m2), 80 mg/m2 = 138  mg, Intravenous,  Once, 2 of 12 cycles Dose modification: 65 mg/m2 (original dose 80 mg/m2, Cycle 7, Reason: Dose not tolerated, Comment: neuropathy) Administration: 138 mg (06/09/2019), 108 mg (06/16/2019) fosaprepitant (EMEND) 150 mg, dexamethasone (DECADRON) 12 mg in sodium chloride 0.9 % 145 mL IVPB, , Intravenous,  Once, 4 of 4 cycles Administration:  (04/14/2019),  (04/28/2019),  (05/12/2019),  (05/26/2019)  for chemotherapy treatment.      CHIEF COMPLIANT: Cycle 3 Taxol  INTERVAL HISTORY: Samantha Dembyis a 74y.o. with above-mentioned history of right breast cancer treated with neoadjuvant letrozole, lumpectomy, and for which Mammaprint testing revealed she was high risk.She underwent bilateral mastectomiesand is currently on adjuvant chemotherapy withweekly Taxol.She presents to the clinic today forcycle3.   ALLERGIES:  is allergic to lisinopril; alendronate [alendronate]; and hydroxyzine.  MEDICATIONS:  Current Outpatient Medications  Medication Sig Dispense Refill  . amLODipine (NORVASC) 10 MG tablet TAKE 1 TABLET(10 MG) BY MOUTH DAILY 90 tablet 1  . CLOBETASOL PROPIONATE E 0.05 % emollient cream APPLY EXTERNALLY TO THE AFFECTED AREA TWICE DAILY (Patient taking differently: Apply 1 application topically 2 (two) times daily as needed (rash). ) 45 g prn  . dexamethasone (DECADRON) 4 MG tablet Take 1 tablet day after chemo and 1 tablet 2 days after chemo with food 30 tablet 1  . HYDROcodone-acetaminophen (NORCO/VICODIN) 5-325 MG tablet Take 1-2 tablets by mouth every 6 (six) hours as needed for moderate pain. 15 tablet 0  . letrozole (FEMARA) 2.5 MG tablet TAKE 1 TABLET(2.5 MG) BY MOUTH DAILY 90 tablet 0  . lidocaine-prilocaine (EMLA) cream Apply to affected area once 30 g 3  . LORazepam (ATIVAN) 0.5 MG tablet Take 1 tablet (0.5 mg total) by mouth every 6 (six) hours as needed (Nausea or vomiting). 30 tablet 0  . methocarbamol (ROBAXIN) 500 MG tablet Take 1 tablet (500  mg total) by mouth every 6 (six) hours as needed for muscle spasms. 20 tablet 1  . Misc Natural Products (ESSIAC TONIC) CAPS Take 1-2 capsules by mouth 2 (two) times daily. Vegetable    . ondansetron (ZOFRAN) 8 MG tablet Take 1 tablet (8 mg total) by mouth 2 (two) times daily as needed. Start on the third day after chemotherapy. 30 tablet 1  . OVER THE COUNTER MEDICATION Take 2 capsules by mouth 2 (two) times daily as needed (Supplement). TTemple-Inland   . prochlorperazine (COMPAZINE) 10 MG tablet Take 1 tablet (10 mg total) by mouth every 6 (six) hours as needed (Nausea or vomiting). 30 tablet 1  . simvastatin (ZOCOR) 20 MG tablet TAKE 1 TABLET BY MOUTH EVERY NIGHT AT BEDTIME 90 tablet 0   No current facility-administered medications for this visit.    PHYSICAL EXAMINATION: ECOG PERFORMANCE STATUS: 1 - Symptomatic but completely ambulatory  Vitals:   06/23/19 1024  BP: 136/70  Pulse: 73  Resp: 17  Temp: 98.5 F (36.9 C)  SpO2: 100%   Filed Weights   06/23/19 1024  Weight: 135 lb 9.6 oz (61.5 kg)    LABORATORY DATA:  I have reviewed the data as listed CMP Latest Ref Rng & Units 06/16/2019 06/09/2019 05/26/2019  Glucose 70 - 99 mg/dL 132(H) 137(H) 140(H)  BUN 8 - 23 mg/dL 9 5(L) 8  Creatinine 0.44 - 1.00 mg/dL 0.67 0.74 0.66  Sodium 135 - 145  mmol/L 142 142 141  Potassium 3.5 - 5.1 mmol/L 3.4(L) 3.3(L) 3.3(L)  Chloride 98 - 111 mmol/L 107 107 107  CO2 22 - 32 mmol/L '25 24 24  ' Calcium 8.9 - 10.3 mg/dL 8.6(L) 8.7(L) 8.8(L)  Total Protein 6.5 - 8.1 g/dL 6.7 6.5 6.8  Total Bilirubin 0.3 - 1.2 mg/dL 0.5 0.3 0.4  Alkaline Phos 38 - 126 U/L 80 108 109  AST 15 - 41 U/L 14(L) 9(L) 10(L)  ALT 0 - 44 U/L '14 7 8    ' Lab Results  Component Value Date   WBC 4.8 06/23/2019   HGB 11.2 (L) 06/23/2019   HCT 34.9 (L) 06/23/2019   MCV 94.8 06/23/2019   PLT 309 06/23/2019   NEUTROABS 2.9 06/23/2019    ASSESSMENT & PLAN:  Malignant neoplasm of upper-outer quadrant of right breast in  female, estrogen receptor positive ([place]) 04/23/2018 palpable lump in right breast UOQ, mammogram revealed 2 separate masses. 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, margins negative, 2/3 lymph nodes positive, ER 95%, PR 0%, HER-2 negative, Ki-67 10%  Treatment plan: 1.Neoadjuvant antiestrogen therapy12/02/2018-July 2020 3.12/24/2018 Right lumpectomy: Residual invasive ductal carcinoma 3.8 cm, margins negative, 2/3 lymph nodes positive, ER 95%, PR 0%, HER-2 negative, Ki-67 10% 4.MammaPrint high risk: Adjuvant chemotherapy is being recommended with dose dense Adriamycin and Cytoxan followed by Taxol 5.adjuvant radiation therapy 5.Followed by adjuvant antiestrogen therapy ----------------------------------------------------------------------------------------------------------------------------------------------- 03/11/2019 bilateral mastectomies Samantha Cross):  Left breast: benign tissue and two negative lymph nodes Right breast: residual IDC0.5 cm , clear margins, 2 lymph nodes negative, ER 95%, PR 0%, HER-2 -1+, Ki-67 10%  Treatment plan: Adjuvant chemotherapy with dose dense Adriamycin and Cytoxan x4 followed by Taxol weekly x12 clinical trialSWOG 1714 ---------------------------------------------------------------------------------------------------------------------------------- Current Treatment:Completed 4 cycles ofdose dense Adriamycin and Cytoxan, today is cycle 3 Taxol Labs  reviewed Echocardiogram9/03/2019: EF 55 to 60%  Chemo toxicities: 1.Headaches: Stable 2.Constipation: Improved 3.  Fatigue after chemotherapy: Being monitored 4.  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 Denies any nausea or vomiting. I encouraged her to get the COVID-19 vaccine.  Return to clinic weekly for Taxol every other week for follow-up with me.    No orders of the defined types were placed in this encounter.  The patient has a good understanding of the overall plan. she agrees with it. she will call with any problems that may develop before the next visit here.  Total time spent: 30 mins including face to face time and time spent for planning, charting and coordination of care  Samantha Lose, MD 06/23/2019  I, Cloyde Reams Dorshimer, am acting as scribe for Dr. Nicholas Cross.  I have reviewed the above documentation for accuracy and completeness, and I agree with the above.

## 2019-06-23 ENCOUNTER — Inpatient Hospital Stay: Payer: Medicare Other

## 2019-06-23 ENCOUNTER — Inpatient Hospital Stay (HOSPITAL_BASED_OUTPATIENT_CLINIC_OR_DEPARTMENT_OTHER): Payer: Medicare Other | Admitting: Hematology and Oncology

## 2019-06-23 ENCOUNTER — Other Ambulatory Visit: Payer: Self-pay

## 2019-06-23 VITALS — BP 117/65 | HR 68 | Temp 98.9°F | Resp 16

## 2019-06-23 DIAGNOSIS — C50411 Malignant neoplasm of upper-outer quadrant of right female breast: Secondary | ICD-10-CM

## 2019-06-23 DIAGNOSIS — Z170 Estrogen receptor positive status [ER+]: Secondary | ICD-10-CM

## 2019-06-23 DIAGNOSIS — Z9013 Acquired absence of bilateral breasts and nipples: Secondary | ICD-10-CM | POA: Diagnosis not present

## 2019-06-23 DIAGNOSIS — Z5111 Encounter for antineoplastic chemotherapy: Secondary | ICD-10-CM | POA: Diagnosis not present

## 2019-06-23 DIAGNOSIS — Z79811 Long term (current) use of aromatase inhibitors: Secondary | ICD-10-CM | POA: Diagnosis not present

## 2019-06-23 DIAGNOSIS — C50011 Malignant neoplasm of nipple and areola, right female breast: Secondary | ICD-10-CM

## 2019-06-23 DIAGNOSIS — Z95828 Presence of other vascular implants and grafts: Secondary | ICD-10-CM | POA: Insufficient documentation

## 2019-06-23 LAB — CMP (CANCER CENTER ONLY)
ALT: 21 U/L (ref 0–44)
AST: 19 U/L (ref 15–41)
Albumin: 3.7 g/dL (ref 3.5–5.0)
Alkaline Phosphatase: 66 U/L (ref 38–126)
Anion gap: 9 (ref 5–15)
BUN: 5 mg/dL — ABNORMAL LOW (ref 8–23)
CO2: 23 mmol/L (ref 22–32)
Calcium: 8.5 mg/dL — ABNORMAL LOW (ref 8.9–10.3)
Chloride: 110 mmol/L (ref 98–111)
Creatinine: 0.62 mg/dL (ref 0.44–1.00)
GFR, Est AFR Am: >60 mL/min (ref >60)
GFR, Estimated: >60 mL/min (ref >60)
Glucose, Bld: 91 mg/dL (ref 70–99)
Potassium: 3.9 mmol/L (ref 3.5–5.1)
Sodium: 142 mmol/L (ref 135–145)
Total Bilirubin: 0.5 mg/dL (ref 0.3–1.2)
Total Protein: 6.5 g/dL (ref 6.5–8.1)

## 2019-06-23 LAB — CBC WITH DIFFERENTIAL (CANCER CENTER ONLY)
Abs Immature Granulocytes: 0.03 10*3/uL (ref 0.00–0.07)
Basophils Absolute: 0.1 10*3/uL (ref 0.0–0.1)
Basophils Relative: 1 %
Eosinophils Absolute: 0.2 10*3/uL (ref 0.0–0.5)
Eosinophils Relative: 5 %
HCT: 34.9 % — ABNORMAL LOW (ref 36.0–46.0)
Hemoglobin: 11.2 g/dL — ABNORMAL LOW (ref 12.0–15.0)
Immature Granulocytes: 1 %
Lymphocytes Relative: 17 %
Lymphs Abs: 0.8 10*3/uL (ref 0.7–4.0)
MCH: 30.4 pg (ref 26.0–34.0)
MCHC: 32.1 g/dL (ref 30.0–36.0)
MCV: 94.8 fL (ref 80.0–100.0)
Monocytes Absolute: 0.8 10*3/uL (ref 0.1–1.0)
Monocytes Relative: 16 %
Neutro Abs: 2.9 10*3/uL (ref 1.7–7.7)
Neutrophils Relative %: 60 %
Platelet Count: 309 10*3/uL (ref 150–400)
RBC: 3.68 MIL/uL — ABNORMAL LOW (ref 3.87–5.11)
RDW: 17.3 % — ABNORMAL HIGH (ref 11.5–15.5)
WBC Count: 4.8 10*3/uL (ref 4.0–10.5)
nRBC: 0 % (ref 0.0–0.2)

## 2019-06-23 MED ORDER — DIPHENHYDRAMINE HCL 50 MG/ML IJ SOLN
50.0000 mg | Freq: Once | INTRAMUSCULAR | Status: AC
  Administered 2019-06-23: 50 mg via INTRAVENOUS

## 2019-06-23 MED ORDER — SODIUM CHLORIDE 0.9 % IV SOLN
65.0000 mg/m2 | Freq: Once | INTRAVENOUS | Status: AC
  Administered 2019-06-23: 108 mg via INTRAVENOUS
  Filled 2019-06-23: qty 18

## 2019-06-23 MED ORDER — FAMOTIDINE IN NACL 20-0.9 MG/50ML-% IV SOLN
20.0000 mg | Freq: Once | INTRAVENOUS | Status: AC
  Administered 2019-06-23: 20 mg via INTRAVENOUS

## 2019-06-23 MED ORDER — SODIUM CHLORIDE 0.9% FLUSH
10.0000 mL | Freq: Once | INTRAVENOUS | Status: AC
  Administered 2019-06-23: 10 mL
  Filled 2019-06-23: qty 10

## 2019-06-23 MED ORDER — DIPHENHYDRAMINE HCL 50 MG/ML IJ SOLN
INTRAMUSCULAR | Status: AC
  Filled 2019-06-23: qty 1

## 2019-06-23 MED ORDER — SODIUM CHLORIDE 0.9% FLUSH
10.0000 mL | INTRAVENOUS | Status: DC | PRN
  Administered 2019-06-23: 10 mL
  Filled 2019-06-23: qty 10

## 2019-06-23 MED ORDER — SODIUM CHLORIDE 0.9 % IV SOLN
Freq: Once | INTRAVENOUS | Status: AC
  Filled 2019-06-23: qty 250

## 2019-06-23 MED ORDER — FAMOTIDINE IN NACL 20-0.9 MG/50ML-% IV SOLN
INTRAVENOUS | Status: AC
  Filled 2019-06-23: qty 50

## 2019-06-23 MED ORDER — SODIUM CHLORIDE 0.9 % IV SOLN
20.0000 mg | Freq: Once | INTRAVENOUS | Status: AC
  Administered 2019-06-23: 20 mg via INTRAVENOUS
  Filled 2019-06-23: qty 20

## 2019-06-23 MED ORDER — HEPARIN SOD (PORK) LOCK FLUSH 100 UNIT/ML IV SOLN
500.0000 [IU] | Freq: Once | INTRAVENOUS | Status: AC | PRN
  Administered 2019-06-23: 500 [IU]
  Filled 2019-06-23: qty 5

## 2019-06-23 NOTE — Patient Instructions (Signed)
[facility] Discharge Instructions for Patients Receiving Chemotherapy  Today you received the following chemotherapy agents:  Taxol (paclitaxel)  To help prevent nausea and vomiting after your treatment, we encourage you to take your nausea medication as prescribed.   If you develop nausea and vomiting that is not controlled by your nausea medication, call the clinic.   BELOW ARE SYMPTOMS THAT SHOULD BE REPORTED IMMEDIATELY:  *FEVER GREATER THAN 100.5 F  *CHILLS WITH OR WITHOUT FEVER  NAUSEA AND VOMITING THAT IS NOT CONTROLLED WITH YOUR NAUSEA MEDICATION  *UNUSUAL SHORTNESS OF BREATH  *UNUSUAL BRUISING OR BLEEDING  TENDERNESS IN MOUTH AND THROAT WITH OR WITHOUT PRESENCE OF ULCERS  *URINARY PROBLEMS  *BOWEL PROBLEMS  UNUSUAL RASH Items with * indicate a potential emergency and should be followed up as soon as possible.  Feel free to call the clinic should you have any questions or concerns. The clinic phone number is [phone].  Please show the CHEMO ALERT CARD at check-in to the Emergency Department and triage nurse.   

## 2019-06-23 NOTE — Assessment & Plan Note (Signed)
04/23/2018 palpable lump in right breast UOQ, mammogram revealed 2 separate masses. 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, margins negative, 2/3 lymph nodes positive, ER 95%, PR 0%, HER-2 negative, Ki-67 10%  Treatment plan: 1.Neoadjuvant antiestrogen therapy12/02/2018-July 2020 3.12/24/2018 Right lumpectomy: Residual invasive ductal carcinoma 3.8 cm, margins negative, 2/3 lymph nodes positive, ER 95%, PR 0%, HER-2 negative, Ki-67 10% 4.MammaPrint high risk: Adjuvant chemotherapy is being recommended with dose dense Adriamycin and Cytoxan followed by Taxol 5.adjuvant radiation therapy 5.Followed by adjuvant antiestrogen therapy ----------------------------------------------------------------------------------------------------------------------------------------------- 03/11/2019 bilateral mastectomies [name]):  Left breast: benign tissue and two negative lymph nodes Right breast: residual IDC0.5 cm , clear margins, 2 lymph nodes negative, ER 95%, PR 0%, HER-2 -1+, Ki-67 10%  Treatment plan: Adjuvant chemotherapy with dose dense Adriamycin and Cytoxan x4 followed by Taxol weekly x12 clinical trialSWOG 1714 ---------------------------------------------------------------------------------------------------------------------------------- Current Treatment:Completed 4 cycles ofdose dense Adriamycin and Cytoxan, today cycle 1 Taxol Labs reviewed Echocardiogram9/03/2019: EF 55 to 60%  Chemo  toxicities: 1.Headaches: Stable 2.Constipation: Improved 3.  Fatigue after chemotherapy: Being monitored Denies any nausea or vomiting.  Return to clinic in 1 week for cycle 2 Taxol toxicity check

## 2019-06-25 ENCOUNTER — Other Ambulatory Visit: Payer: Self-pay | Admitting: *Deleted

## 2019-06-25 ENCOUNTER — Other Ambulatory Visit: Payer: Self-pay | Admitting: Family Medicine

## 2019-06-25 ENCOUNTER — Telehealth: Payer: Self-pay

## 2019-06-25 MED ORDER — AMLODIPINE BESYLATE 10 MG PO TABS: 10.0000 mg | ORAL_TABLET | Freq: Every day | ORAL | 0 refills | Status: DC

## 2019-06-25 MED ORDER — SIMVASTATIN 20 MG PO TABS: 20.0000 mg | ORAL_TABLET | Freq: Every day | ORAL | 0 refills | Status: DC

## 2019-06-25 NOTE — Telephone Encounter (Signed)
Must make appointment 

## 2019-06-25 NOTE — Telephone Encounter (Signed)
Martella called and states she would like to know if she can eat grapefruit. She was advised to call Dr Madilyn Fireman from the oncologist. She was advised she may be on medications that grapefruit should not be eaten. Please advise.

## 2019-06-26 NOTE — Telephone Encounter (Signed)
Grapefruit will interact with her medication.  Interestingly grapefruit goes through some of the same pathways in the liver that certain medications are metabolized through.  In particular her cholesterol pill so if she does choose to eat it I would only do it infrequently and in small amounts but not on a regular basis or in significant quantities.

## 2019-06-26 NOTE — Telephone Encounter (Signed)
Left message advising of recommendations.  

## 2019-06-26 NOTE — Telephone Encounter (Signed)
Patient called back and was advised of recommendations. She did not have any questions.

## 2019-06-29 NOTE — Progress Notes (Signed)
Patient Care Team: [name], MD as PCP - General (Family Medicine) [name], RN as Oncology Nurse Navigator [name], RN as Oncology Nurse Navigator  DIAGNOSIS:    ICD-10-CM   1. Malignant neoplasm of upper-outer quadrant of right breast in female, estrogen receptor positive ([place])  C50.411    Z17.0     SUMMARY OF ONCOLOGIC HISTORY: Oncology History  Malignant neoplasm of upper-outer quadrant of right breast in female, estrogen receptor positive ([place])  04/23/2018 Initial Diagnosis   Palpable lump in right breast UOQ, mammogram revealed 2 separate masses.  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  The Multi-Gene Panel offered by [facility] includes sequencing and/or deletion duplication testing of the following 85 genes: AIP, ALK, APC, ATM, AXIN2,BAP1,  BARD1, BLM, BMPR1A, BRCA1, BRCA2, BRIP1, CASR, CDC73, CDH1, CDK4, CDKN1B, CDKN1C, CDKN2A (p14ARF), CDKN2A (p16INK4a), CEBPA, CHEK2, CTNNA1, DICER1, DIS3L2, EGFR (c.2369C>T, p.Thr790Met variant only), EPCAM (Deletion/duplication testing only), FH, FLCN, GATA2, GPC3, GREM1 (Promoter region deletion/duplication testing only), HOXB13 (c.251G>A, p.Gly84Glu), HRAS, KIT, MAX, MEN1, MET, MITF (c.952G>A, p.Glu318Lys variant only), MLH1, MSH2, MSH3, MSH6, MUTYH, NBN, NF1, NF2, NTHL1, PALB2, PDGFRA, PHOX2B, PMS2, POLD1, POLE, POT1, PRKAR1A, PTCH1, PTEN, RAD50, RAD51C, RAD51D, RB1, RECQL4, RET, RNF43, RUNX1,  SDHAF2, SDHA (sequence changes only), SDHB, SDHC, SDHD, SMAD4, SMARCA4, SMARCB1, SMARCE1, STK11, SUFU, TERC, TERT, TMEM127, TP53, TSC1, TSC2, VHL, WRN and WT1.      12/24/2018 Surgery   Right lumpectomy [name]): residual invasive carcinoma, clear margins, and 2/3 lymph nodes positive for carcinoma.    01/14/2019 Cancer Staging   Staging form: Breast, AJCC 8th Edition - Pathologic stage from 01/14/2019: No Stage Recommended (ypT2, pN1a, cM0, G2, ER+, PR-, HER2-) - Signed by [name], MD on 01/14/2019   01/22/2019 Miscellaneous   MammaPrint: High risk luminal type B   03/11/2019 Surgery   Bilateral mastectomies [name]):   Left breast: benign tissue and two negative lymph nodes Right breast: residual IDC 0.5 cm, clear margins, 2 lymph nodes negativeER 95%, PR 0%, HER-2 -1+, Ki-67 10%   04/14/2019 -  Chemotherapy   The patient had DOXOrubicin (ADRIAMYCIN) chemo injection 102 mg, 60 mg/m2 = 102 mg, Intravenous,  Once, 4 of 4 cycles Dose modification: 50 mg/m2 (original dose 60 mg/m2, Cycle 2, Reason: Dose not tolerated) Administration: 102 mg (04/14/2019), 84 mg (04/28/2019), 84 mg (05/12/2019), 84 mg (05/26/2019) palonosetron (ALOXI) injection 0.25 mg, 0.25 mg, Intravenous,  Once, 4 of 4 cycles Administration: 0.25 mg (04/14/2019), 0.25 mg (04/28/2019), 0.25 mg (05/12/2019), 0.25 mg (05/26/2019) pegfilgrastim-jmdb (FULPHILA) injection 6 mg, 6 mg, Subcutaneous,  Once, 4 of 4 cycles Administration: 6 mg (04/16/2019), 6 mg (04/30/2019), 6 mg (05/14/2019), 6 mg (05/28/2019) cyclophosphamide (CYTOXAN) 1,020 mg in sodium chloride 0.9 % 250 mL chemo infusion, 600 mg/m2 = 1,020 mg, Intravenous,  Once, 4 of 4 cycles Dose modification: 500 mg/m2 (original dose 600 mg/m2, Cycle 2, Reason: Dose not tolerated) Administration: 1,020 mg (04/14/2019), 840  mg (04/28/2019), 840 mg (05/12/2019), 840 mg (05/26/2019) PACLitaxel (TAXOL) 138 mg in sodium chloride 0.9 % 250 mL chemo infusion (</= 26m/m2), 80 mg/m2 = 138  mg, Intravenous,  Once, 3 of 12 cycles Dose modification: 65 mg/m2 (original dose 80 mg/m2, Cycle 7, Reason: Dose not tolerated, Comment: neuropathy) Administration: 138 mg (06/09/2019), 108 mg (06/16/2019), 108 mg (06/23/2019) fosaprepitant (EMEND) 150 mg, dexamethasone (DECADRON) 12 mg in sodium chloride 0.9 % 145 mL IVPB, , Intravenous,  Once, 4 of 4 cycles Administration:  (04/14/2019),  (04/28/2019),  (05/12/2019),  (05/26/2019)  for chemotherapy treatment.      CHIEF COMPLIANT: Cycle 4 Taxol  INTERVAL HISTORY: DRenessa Wellnitzis a 74y.o. with above-mentioned history of right breast cancer treated with neoadjuvant letrozole, lumpectomy, and for which Mammaprint testing revealed she was high risk.She underwent bilateral mastectomiesand is currently on adjuvant chemotherapy withweekly Taxol.She presents to the clinic today forcycle4.   ALLERGIES:  is allergic to lisinopril; alendronate [alendronate]; and hydroxyzine.  MEDICATIONS:  Current Outpatient Medications  Medication Sig Dispense Refill  . amLODipine (NORVASC) 10 MG tablet Take 1 tablet (10 mg total) by mouth daily. MUST SCHEDULE AND KEEP APPOINTMENT FOR REFILLS. 90 tablet 0  . CLOBETASOL PROPIONATE E 0.05 % emollient cream APPLY EXTERNALLY TO THE AFFECTED AREA TWICE DAILY (Patient taking differently: Apply 1 application topically 2 (two) times daily as needed (rash). ) 45 g prn  . dexamethasone (DECADRON) 4 MG tablet Take 1 tablet day after chemo and 1 tablet 2 days after chemo with food 30 tablet 1  . HYDROcodone-acetaminophen (NORCO/VICODIN) 5-325 MG tablet Take 1-2 tablets by mouth every 6 (six) hours as needed for moderate pain. 15 tablet 0  . letrozole (FEMARA) 2.5 MG tablet TAKE 1 TABLET(2.5 MG) BY MOUTH DAILY 90 tablet 0  . lidocaine-prilocaine (EMLA) cream Apply to affected area once 30 g 3  . LORazepam (ATIVAN) 0.5 MG tablet Take 1 tablet (0.5 mg total) by mouth every 6 (six) hours as needed (Nausea or  vomiting). 30 tablet 0  . methocarbamol (ROBAXIN) 500 MG tablet Take 1 tablet (500 mg total) by mouth every 6 (six) hours as needed for muscle spasms. 20 tablet 1  . Misc Natural Products (ESSIAC TONIC) CAPS Take 1-2 capsules by mouth 2 (two) times daily. Vegetable    . ondansetron (ZOFRAN) 8 MG tablet Take 1 tablet (8 mg total) by mouth 2 (two) times daily as needed. Start on the third day after chemotherapy. 30 tablet 1  . OVER THE COUNTER MEDICATION Take 2 capsules by mouth 2 (two) times daily as needed (Supplement). TTemple-Inland   . prochlorperazine (COMPAZINE) 10 MG tablet Take 1 tablet (10 mg total) by mouth every 6 (six) hours as needed (Nausea or vomiting). 30 tablet 1  . simvastatin (ZOCOR) 20 MG tablet Take 1 tablet (20 mg total) by mouth at bedtime. MUST SCHEDULE AND KEEP APPOINTMENT AND HAVE LABS DONE FOR REFILLS 90 tablet 0   No current facility-administered medications for this visit.    PHYSICAL EXAMINATION: ECOG PERFORMANCE STATUS: 1 - Symptomatic but completely ambulatory  Vitals:   06/30/19 1352  BP: 127/68  Pulse: 80  Resp: 18  Temp: 98.7 F (37.1 C)  SpO2: 100%   Filed Weights   06/30/19 1352  Weight: 135 lb 3.2 oz (61.3 kg)    LABORATORY DATA:  I have reviewed the data as listed CMP Latest Ref Rng & Units 06/23/2019 06/16/2019 06/09/2019  Glucose 70 - 99 mg/dL 91 132(H)  137(H)  BUN 8 - 23 mg/dL 5(L) 9 5(L)  Creatinine 0.44 - 1.00 mg/dL 0.62 0.67 0.74  Sodium 135 - 145 mmol/L 142 142 142  Potassium 3.5 - 5.1 mmol/L 3.9 3.4(L) 3.3(L)  Chloride 98 - 111 mmol/L 110 107 107  CO2 22 - 32 mmol/L '23 25 24  ' Calcium 8.9 - 10.3 mg/dL 8.5(L) 8.6(L) 8.7(L)  Total Protein 6.5 - 8.1 g/dL 6.5 6.7 6.5  Total Bilirubin 0.3 - 1.2 mg/dL 0.5 0.5 0.3  Alkaline Phos 38 - 126 U/L 66 80 108  AST 15 - 41 U/L 19 14(L) 9(L)  ALT 0 - 44 U/L '21 14 7    ' Lab Results  Component Value Date   WBC 6.6 06/30/2019   HGB 11.3 (L) 06/30/2019   HCT 34.2 (L) 06/30/2019   MCV 94.5  06/30/2019   PLT 304 06/30/2019   NEUTROABS 4.3 06/30/2019    ASSESSMENT & PLAN:  Malignant neoplasm of upper-outer quadrant of right breast in female, estrogen receptor positive ([place]) 04/23/2018 palpable lump in right breast UOQ, mammogram revealed 2 separate masses. 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, margins negative, 2/3 lymph nodes positive, ER 95%, PR 0%, HER-2 negative, Ki-67 10%  Treatment plan: 1.Neoadjuvant antiestrogen therapy12/02/2018-July 2020 3.12/24/2018 Right lumpectomy: Residual invasive ductal carcinoma 3.8 cm, margins negative, 2/3 lymph nodes positive, ER 95%, PR 0%, HER-2 negative, Ki-67 10%  03/11/2019 bilateral mastectomies [name]):  Left breast: benign tissue and two negative lymph nodes Right breast: residual IDC0.5 cm , clear margins, 2 lymph nodes negative, ER 95%, PR 0%, HER-2 -1+, Ki-67 10%  4.MammaPrint high risk: Adjuvant chemotherapy is being recommended with dose dense Adriamycin and Cytoxan followed by Taxol 5.adjuvant radiation therapy 5.Followed by adjuvant antiestrogen therapy ----------------------------------------------------------------------------------------------------------------------------------------------- clinical trialSWOG [id number] ---------------------------------------------------------------------------------------------------------------------------------- Current Treatment:Completed 4 cycles ofdose dense Adriamycin and Cytoxan, today is cycle 4  Taxol Labs reviewed Echocardiogram9/03/2019: EF 55 to 60%  Chemo toxicities: 1.Headaches: Better 2.Constipation: No further issues 3.Fatigue after chemotherapy: Moderate severity 4.  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  Return to clinic weekly for Taxol every other week for follow-up with me.    No orders of the defined types were placed in this encounter.  The patient has a good understanding of the overall plan. she agrees with it. she will call with any problems that may develop before the next visit here.  Total time spent: 30 mins including face to face time and time spent for planning, charting and coordination of care  [name], MD 06/30/2019  I, Cloyde Reams Dorshimer, am acting as scribe for Dr. [name].  I have reviewed the above documentation for accuracy and completeness, and I agree with the above.

## 2019-06-30 ENCOUNTER — Inpatient Hospital Stay: Payer: Medicare Other

## 2019-06-30 ENCOUNTER — Other Ambulatory Visit: Payer: Self-pay

## 2019-06-30 ENCOUNTER — Inpatient Hospital Stay (HOSPITAL_BASED_OUTPATIENT_CLINIC_OR_DEPARTMENT_OTHER): Payer: Medicare Other | Admitting: Hematology and Oncology

## 2019-06-30 DIAGNOSIS — Z170 Estrogen receptor positive status [ER+]: Secondary | ICD-10-CM | POA: Diagnosis not present

## 2019-06-30 DIAGNOSIS — Z79811 Long term (current) use of aromatase inhibitors: Secondary | ICD-10-CM | POA: Diagnosis not present

## 2019-06-30 DIAGNOSIS — Z9013 Acquired absence of bilateral breasts and nipples: Secondary | ICD-10-CM | POA: Diagnosis not present

## 2019-06-30 DIAGNOSIS — C50411 Malignant neoplasm of upper-outer quadrant of right female breast: Secondary | ICD-10-CM

## 2019-06-30 DIAGNOSIS — Z95828 Presence of other vascular implants and grafts: Secondary | ICD-10-CM

## 2019-06-30 DIAGNOSIS — Z5111 Encounter for antineoplastic chemotherapy: Secondary | ICD-10-CM | POA: Diagnosis not present

## 2019-06-30 DIAGNOSIS — C50011 Malignant neoplasm of nipple and areola, right female breast: Secondary | ICD-10-CM

## 2019-06-30 LAB — CBC WITH DIFFERENTIAL (CANCER CENTER ONLY)
Abs Immature Granulocytes: 0.03 10*3/uL (ref 0.00–0.07)
Basophils Absolute: 0.1 10*3/uL (ref 0.0–0.1)
Basophils Relative: 1 %
Eosinophils Absolute: 0.3 10*3/uL (ref 0.0–0.5)
Eosinophils Relative: 4 %
HCT: 34.2 % — ABNORMAL LOW (ref 36.0–46.0)
Hemoglobin: 11.3 g/dL — ABNORMAL LOW (ref 12.0–15.0)
Immature Granulocytes: 1 %
Lymphocytes Relative: 20 %
Lymphs Abs: 1.3 10*3/uL (ref 0.7–4.0)
MCH: 31.2 pg (ref 26.0–34.0)
MCHC: 33 g/dL (ref 30.0–36.0)
MCV: 94.5 fL (ref 80.0–100.0)
Monocytes Absolute: 0.7 10*3/uL (ref 0.1–1.0)
Monocytes Relative: 10 %
Neutro Abs: 4.3 10*3/uL (ref 1.7–7.7)
Neutrophils Relative %: 64 %
Platelet Count: 304 10*3/uL (ref 150–400)
RBC: 3.62 MIL/uL — ABNORMAL LOW (ref 3.87–5.11)
RDW: 17.3 % — ABNORMAL HIGH (ref 11.5–15.5)
WBC Count: 6.6 10*3/uL (ref 4.0–10.5)
nRBC: 0 % (ref 0.0–0.2)

## 2019-06-30 LAB — CMP (CANCER CENTER ONLY)
ALT: 18 U/L (ref 0–44)
AST: 16 U/L (ref 15–41)
Albumin: 3.7 g/dL (ref 3.5–5.0)
Alkaline Phosphatase: 68 U/L (ref 38–126)
Anion gap: 9 (ref 5–15)
BUN: 8 mg/dL (ref 8–23)
CO2: 24 mmol/L (ref 22–32)
Calcium: 8.7 mg/dL — ABNORMAL LOW (ref 8.9–10.3)
Chloride: 108 mmol/L (ref 98–111)
Creatinine: 0.7 mg/dL (ref 0.44–1.00)
GFR, Est AFR Am: >60 mL/min (ref >60)
GFR, Estimated: >60 mL/min (ref >60)
Glucose, Bld: 151 mg/dL — ABNORMAL HIGH (ref 70–99)
Potassium: 3.4 mmol/L — ABNORMAL LOW (ref 3.5–5.1)
Sodium: 141 mmol/L (ref 135–145)
Total Bilirubin: 0.6 mg/dL (ref 0.3–1.2)
Total Protein: 6.5 g/dL (ref 6.5–8.1)

## 2019-06-30 MED ORDER — FAMOTIDINE IN NACL 20-0.9 MG/50ML-% IV SOLN
20.0000 mg | Freq: Once | INTRAVENOUS | Status: AC
  Administered 2019-06-30: 20 mg via INTRAVENOUS

## 2019-06-30 MED ORDER — SODIUM CHLORIDE 0.9 % IV SOLN
20.0000 mg | Freq: Once | INTRAVENOUS | Status: AC
  Administered 2019-06-30: 20 mg via INTRAVENOUS
  Filled 2019-06-30: qty 20

## 2019-06-30 MED ORDER — SODIUM CHLORIDE 0.9% FLUSH
10.0000 mL | Freq: Once | INTRAVENOUS | Status: AC
  Administered 2019-06-30: 10 mL
  Filled 2019-06-30: qty 10

## 2019-06-30 MED ORDER — SODIUM CHLORIDE 0.9 % IV SOLN
65.0000 mg/m2 | Freq: Once | INTRAVENOUS | Status: AC
  Administered 2019-06-30: 108 mg via INTRAVENOUS
  Filled 2019-06-30: qty 18

## 2019-06-30 MED ORDER — DIPHENHYDRAMINE HCL 50 MG/ML IJ SOLN
50.0000 mg | Freq: Once | INTRAMUSCULAR | Status: AC
  Administered 2019-06-30: 50 mg via INTRAVENOUS

## 2019-06-30 MED ORDER — SODIUM CHLORIDE 0.9 % IV SOLN
Freq: Once | INTRAVENOUS | Status: AC
  Filled 2019-06-30: qty 250

## 2019-06-30 MED ORDER — DIPHENHYDRAMINE HCL 50 MG/ML IJ SOLN
INTRAMUSCULAR | Status: AC
  Filled 2019-06-30: qty 1

## 2019-06-30 MED ORDER — FAMOTIDINE IN NACL 20-0.9 MG/50ML-% IV SOLN
INTRAVENOUS | Status: AC
  Filled 2019-06-30: qty 50

## 2019-06-30 MED ORDER — SODIUM CHLORIDE 0.9% FLUSH
10.0000 mL | INTRAVENOUS | Status: DC | PRN
  Administered 2019-06-30: 10 mL
  Filled 2019-06-30: qty 10

## 2019-06-30 MED ORDER — HEPARIN SOD (PORK) LOCK FLUSH 100 UNIT/ML IV SOLN
500.0000 [IU] | Freq: Once | INTRAVENOUS | Status: AC | PRN
  Administered 2019-06-30: 500 [IU]
  Filled 2019-06-30: qty 5

## 2019-06-30 NOTE — Patient Instructions (Signed)
[facility] Discharge Instructions for Patients Receiving Chemotherapy  Today you received the following chemotherapy agents:  Taxol.  To help prevent nausea and vomiting after your treatment, we encourage you to take your nausea medication as directed.   If you develop nausea and vomiting that is not controlled by your nausea medication, call the clinic.   BELOW ARE SYMPTOMS THAT SHOULD BE REPORTED IMMEDIATELY:  *FEVER GREATER THAN 100.5 F  *CHILLS WITH OR WITHOUT FEVER  NAUSEA AND VOMITING THAT IS NOT CONTROLLED WITH YOUR NAUSEA MEDICATION  *UNUSUAL SHORTNESS OF BREATH  *UNUSUAL BRUISING OR BLEEDING  TENDERNESS IN MOUTH AND THROAT WITH OR WITHOUT PRESENCE OF ULCERS  *URINARY PROBLEMS  *BOWEL PROBLEMS  UNUSUAL RASH Items with * indicate a potential emergency and should be followed up as soon as possible.  Feel free to call the clinic should you have any questions or concerns. The clinic phone number is [phone].  Please show the CHEMO ALERT CARD at check-in to the Emergency Department and triage nurse.   

## 2019-06-30 NOTE — Assessment & Plan Note (Signed)
04/23/2018 palpable lump in right breast UOQ, mammogram revealed 2 separate masses. 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, margins negative, 2/3 lymph nodes positive, ER 95%, PR 0%, HER-2 negative, Ki-67 10%  Treatment plan: 1.Neoadjuvant antiestrogen therapy12/02/2018-July 2020 3.12/24/2018 Right lumpectomy: Residual invasive ductal carcinoma 3.8 cm, margins negative, 2/3 lymph nodes positive, ER 95%, PR 0%, HER-2 negative, Ki-67 10%  03/11/2019 bilateral mastectomies ([place]):  Left breast: benign tissue and two negative lymph nodes Right breast: residual IDC0.5 cm , clear margins, 2 lymph nodes negative, ER 95%, PR 0%, HER-2 -1+, Ki-67 10%  4.MammaPrint high risk: Adjuvant chemotherapy is being recommended with dose dense Adriamycin and Cytoxan followed by Taxol 5.adjuvant radiation therapy 5.Followed by adjuvant antiestrogen therapy ----------------------------------------------------------------------------------------------------------------------------------------------- clinical trialSWOG 1714 ---------------------------------------------------------------------------------------------------------------------------------- Current Treatment:Completed 4 cycles ofdose dense Adriamycin and Cytoxan, today is cycle 4 Taxol Labs reviewed Echocardiogram9/03/2019: EF 55 to 60%  Chemo toxicities: 1.Headaches: Better 2.Constipation: No further issues 3.Fatigue after chemotherapy: Moderate  severity 4.  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  Return to clinic weekly for Taxol every other week for follow-up with me. 

## 2019-07-07 ENCOUNTER — Other Ambulatory Visit: Payer: Self-pay

## 2019-07-07 ENCOUNTER — Inpatient Hospital Stay: Payer: Medicare Other

## 2019-07-07 ENCOUNTER — Inpatient Hospital Stay: Payer: Medicare Other | Attending: Hematology and Oncology

## 2019-07-07 VITALS — BP 121/69 | HR 67 | Temp 97.8°F | Resp 18

## 2019-07-07 DIAGNOSIS — Z9013 Acquired absence of bilateral breasts and nipples: Secondary | ICD-10-CM | POA: Insufficient documentation

## 2019-07-07 DIAGNOSIS — Z5111 Encounter for antineoplastic chemotherapy: Secondary | ICD-10-CM | POA: Diagnosis not present

## 2019-07-07 DIAGNOSIS — Z79899 Other long term (current) drug therapy: Secondary | ICD-10-CM | POA: Diagnosis not present

## 2019-07-07 DIAGNOSIS — C50411 Malignant neoplasm of upper-outer quadrant of right female breast: Secondary | ICD-10-CM | POA: Insufficient documentation

## 2019-07-07 DIAGNOSIS — Z79811 Long term (current) use of aromatase inhibitors: Secondary | ICD-10-CM | POA: Insufficient documentation

## 2019-07-07 DIAGNOSIS — Z170 Estrogen receptor positive status [ER+]: Secondary | ICD-10-CM | POA: Diagnosis not present

## 2019-07-07 DIAGNOSIS — Z95828 Presence of other vascular implants and grafts: Secondary | ICD-10-CM

## 2019-07-07 DIAGNOSIS — Z7952 Long term (current) use of systemic steroids: Secondary | ICD-10-CM | POA: Diagnosis not present

## 2019-07-07 DIAGNOSIS — C50011 Malignant neoplasm of nipple and areola, right female breast: Secondary | ICD-10-CM

## 2019-07-07 LAB — CMP (CANCER CENTER ONLY)
ALT: 20 U/L (ref 0–44)
AST: 17 U/L (ref 15–41)
Albumin: 3.8 g/dL (ref 3.5–5.0)
Alkaline Phosphatase: 62 U/L (ref 38–126)
Anion gap: 8 (ref 5–15)
BUN: 8 mg/dL (ref 8–23)
CO2: 24 mmol/L (ref 22–32)
Calcium: 8.6 mg/dL — ABNORMAL LOW (ref 8.9–10.3)
Chloride: 109 mmol/L (ref 98–111)
Creatinine: 0.74 mg/dL (ref 0.44–1.00)
GFR, Est AFR Am: >60 mL/min (ref >60)
GFR, Estimated: >60 mL/min (ref >60)
Glucose, Bld: 122 mg/dL — ABNORMAL HIGH (ref 70–99)
Potassium: 3.4 mmol/L — ABNORMAL LOW (ref 3.5–5.1)
Sodium: 141 mmol/L (ref 135–145)
Total Bilirubin: 0.6 mg/dL (ref 0.3–1.2)
Total Protein: 6.6 g/dL (ref 6.5–8.1)

## 2019-07-07 LAB — CBC WITH DIFFERENTIAL (CANCER CENTER ONLY)
Abs Immature Granulocytes: 0.03 10*3/uL (ref 0.00–0.07)
Basophils Absolute: 0.1 10*3/uL (ref 0.0–0.1)
Basophils Relative: 1 %
Eosinophils Absolute: 0.2 10*3/uL (ref 0.0–0.5)
Eosinophils Relative: 3 %
HCT: 36.2 % (ref 36.0–46.0)
Hemoglobin: 11.5 g/dL — ABNORMAL LOW (ref 12.0–15.0)
Immature Granulocytes: 1 %
Lymphocytes Relative: 18 %
Lymphs Abs: 1.1 10*3/uL (ref 0.7–4.0)
MCH: 30.7 pg (ref 26.0–34.0)
MCHC: 31.8 g/dL (ref 30.0–36.0)
MCV: 96.8 fL (ref 80.0–100.0)
Monocytes Absolute: 0.6 10*3/uL (ref 0.1–1.0)
Monocytes Relative: 9 %
Neutro Abs: 4.3 10*3/uL (ref 1.7–7.7)
Neutrophils Relative %: 68 %
Platelet Count: 252 10*3/uL (ref 150–400)
RBC: 3.74 MIL/uL — ABNORMAL LOW (ref 3.87–5.11)
RDW: 17.1 % — ABNORMAL HIGH (ref 11.5–15.5)
WBC Count: 6.2 10*3/uL (ref 4.0–10.5)
nRBC: 0 % (ref 0.0–0.2)

## 2019-07-07 MED ORDER — SODIUM CHLORIDE 0.9 % IV SOLN
20.0000 mg | Freq: Once | INTRAVENOUS | Status: AC
  Administered 2019-07-07: 15:00:00 20 mg via INTRAVENOUS
  Filled 2019-07-07: qty 20

## 2019-07-07 MED ORDER — FAMOTIDINE IN NACL 20-0.9 MG/50ML-% IV SOLN
INTRAVENOUS | Status: AC
  Filled 2019-07-07: qty 50

## 2019-07-07 MED ORDER — FAMOTIDINE IN NACL 20-0.9 MG/50ML-% IV SOLN
20.0000 mg | Freq: Once | INTRAVENOUS | Status: AC
  Administered 2019-07-07: 14:00:00 20 mg via INTRAVENOUS

## 2019-07-07 MED ORDER — DIPHENHYDRAMINE HCL 50 MG/ML IJ SOLN
50.0000 mg | Freq: Once | INTRAMUSCULAR | Status: AC
  Administered 2019-07-07: 14:00:00 50 mg via INTRAVENOUS

## 2019-07-07 MED ORDER — SODIUM CHLORIDE 0.9% FLUSH
10.0000 mL | INTRAVENOUS | Status: DC | PRN
  Administered 2019-07-07: 17:00:00 10 mL
  Filled 2019-07-07: qty 10

## 2019-07-07 MED ORDER — DIPHENHYDRAMINE HCL 50 MG/ML IJ SOLN
INTRAMUSCULAR | Status: AC
  Filled 2019-07-07: qty 1

## 2019-07-07 MED ORDER — SODIUM CHLORIDE 0.9 % IV SOLN
65.0000 mg/m2 | Freq: Once | INTRAVENOUS | Status: AC
  Administered 2019-07-07: 15:00:00 108 mg via INTRAVENOUS
  Filled 2019-07-07: qty 18

## 2019-07-07 MED ORDER — SODIUM CHLORIDE 0.9% FLUSH
10.0000 mL | Freq: Once | INTRAVENOUS | Status: AC
  Administered 2019-07-07: 13:00:00 10 mL
  Filled 2019-07-07: qty 10

## 2019-07-07 MED ORDER — SODIUM CHLORIDE 0.9 % IV SOLN
Freq: Once | INTRAVENOUS | Status: AC
  Filled 2019-07-07: qty 250

## 2019-07-07 MED ORDER — HEPARIN SOD (PORK) LOCK FLUSH 100 UNIT/ML IV SOLN
500.0000 [IU] | Freq: Once | INTRAVENOUS | Status: AC | PRN
  Administered 2019-07-07: 17:00:00 500 [IU]
  Filled 2019-07-07: qty 5

## 2019-07-07 NOTE — Patient Instructions (Signed)
[facility] Discharge Instructions for Patients Receiving Chemotherapy  Today you received the following chemotherapy agents :  Taxol.  To help prevent nausea and vomiting after your treatment, we encourage you to take your nausea medication as prescribed.   If you develop nausea and vomiting that is not controlled by your nausea medication, call the clinic.   BELOW ARE SYMPTOMS THAT SHOULD BE REPORTED IMMEDIATELY:  *FEVER GREATER THAN 100.5 F  *CHILLS WITH OR WITHOUT FEVER  NAUSEA AND VOMITING THAT IS NOT CONTROLLED WITH YOUR NAUSEA MEDICATION  *UNUSUAL SHORTNESS OF BREATH  *UNUSUAL BRUISING OR BLEEDING  TENDERNESS IN MOUTH AND THROAT WITH OR WITHOUT PRESENCE OF ULCERS  *URINARY PROBLEMS  *BOWEL PROBLEMS  UNUSUAL RASH Items with * indicate a potential emergency and should be followed up as soon as possible.  Feel free to call the clinic should you have any questions or concerns. The clinic phone number is [phone].  Please show the CHEMO ALERT CARD at check-in to the Emergency Department and triage nurse.   

## 2019-07-13 ENCOUNTER — Encounter: Payer: Self-pay | Admitting: *Deleted

## 2019-07-13 NOTE — Progress Notes (Signed)
Patient Care Team: [name], MD as PCP - General (Family Medicine) [name], RN as Oncology Nurse Navigator [name], RN as Oncology Nurse Navigator  DIAGNOSIS:    ICD-10-CM   1. Malignant neoplasm of upper-outer quadrant of right breast in female, estrogen receptor positive ([place])  C50.411    Z17.0     SUMMARY OF ONCOLOGIC HISTORY: Oncology History  Malignant neoplasm of upper-outer quadrant of right breast in female, estrogen receptor positive ([place])  04/23/2018 Initial Diagnosis   Palpable lump in right breast UOQ, mammogram revealed 2 separate masses.  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  The Multi-Gene Panel offered by [facility] includes sequencing and/or deletion duplication testing of the following 85 genes: AIP, ALK, APC, ATM, AXIN2,BAP1,  BARD1, BLM, BMPR1A, BRCA1, BRCA2, BRIP1, CASR, CDC73, CDH1, CDK4, CDKN1B, CDKN1C, CDKN2A (p14ARF), CDKN2A (p16INK4a), CEBPA, CHEK2, CTNNA1, DICER1, DIS3L2, EGFR (c.2369C>T, p.Thr790Met variant only), EPCAM (Deletion/duplication testing only), FH, FLCN, GATA2, GPC3, GREM1 (Promoter region deletion/duplication testing only), HOXB13 (c.251G>A, p.Gly84Glu), HRAS, KIT, MAX, MEN1, MET, MITF (c.952G>A, p.Glu318Lys variant only), MLH1, MSH2, MSH3, MSH6, MUTYH, NBN, NF1, NF2, NTHL1, PALB2, PDGFRA, PHOX2B, PMS2, POLD1, POLE, POT1, PRKAR1A, PTCH1, PTEN, RAD50, RAD51C, RAD51D, RB1, RECQL4, RET, RNF43, RUNX1,  SDHAF2, SDHA (sequence changes only), SDHB, SDHC, SDHD, SMAD4, SMARCA4, SMARCB1, SMARCE1, STK11, SUFU, TERC, TERT, TMEM127, TP53, TSC1, TSC2, VHL, WRN and WT1.      12/24/2018 Surgery   Right lumpectomy [name]): residual invasive carcinoma, clear margins, and 2/3 lymph nodes positive for carcinoma.    01/14/2019 Cancer Staging   Staging form: Breast, AJCC 8th Edition - Pathologic stage from 01/14/2019: No Stage Recommended (ypT2, pN1a, cM0, G2, ER+, PR-, HER2-) - Signed by [name], MD on 01/14/2019   01/22/2019 Miscellaneous   MammaPrint: High risk luminal type B   03/11/2019 Surgery   Bilateral mastectomies [name]):   Left breast: benign tissue and two negative lymph nodes Right breast: residual IDC 0.5 cm, clear margins, 2 lymph nodes negativeER 95%, PR 0%, HER-2 -1+, Ki-67 10%   04/14/2019 -  Chemotherapy   The patient had DOXOrubicin (ADRIAMYCIN) chemo injection 102 mg, 60 mg/m2 = 102 mg, Intravenous,  Once, 4 of 4 cycles Dose modification: 50 mg/m2 (original dose 60 mg/m2, Cycle 2, Reason: Dose not tolerated) Administration: 102 mg (04/14/2019), 84 mg (04/28/2019), 84 mg (05/12/2019), 84 mg (05/26/2019) palonosetron (ALOXI) injection 0.25 mg, 0.25 mg, Intravenous,  Once, 4 of 4 cycles Administration: 0.25 mg (04/14/2019), 0.25 mg (04/28/2019), 0.25 mg (05/12/2019), 0.25 mg (05/26/2019) pegfilgrastim-jmdb (FULPHILA) injection 6 mg, 6 mg, Subcutaneous,  Once, 4 of 4 cycles Administration: 6 mg (04/16/2019), 6 mg (04/30/2019), 6 mg (05/14/2019), 6 mg (05/28/2019) cyclophosphamide (CYTOXAN) 1,020 mg in sodium chloride 0.9 % 250 mL chemo infusion, 600 mg/m2 = 1,020 mg, Intravenous,  Once, 4 of 4 cycles Dose modification: 500 mg/m2 (original dose 600 mg/m2, Cycle 2, Reason: Dose not tolerated) Administration: 1,020 mg (04/14/2019), 840  mg (04/28/2019), 840 mg (05/12/2019), 840 mg (05/26/2019) PACLitaxel (TAXOL) 138 mg in sodium chloride 0.9 % 250 mL chemo infusion (</= 38m/m2), 80 mg/m2 = 138  mg, Intravenous,  Once, 5 of 12 cycles Dose modification: 65 mg/m2 (original dose 80 mg/m2, Cycle 7, Reason: Dose not tolerated, Comment: neuropathy) Administration: 138 mg (06/09/2019), 108 mg (06/16/2019), 108 mg (06/23/2019), 108 mg (06/30/2019), 108 mg (07/07/2019) fosaprepitant (EMEND) 150 mg, dexamethasone (DECADRON) 12 mg in sodium chloride 0.9 % 145 mL IVPB, , Intravenous,  Once, 4 of 4 cycles Administration:  (04/14/2019),  (04/28/2019),  (05/12/2019),  (05/26/2019)  for chemotherapy treatment.      CHIEF COMPLIANT: Cycle 6 Taxol  INTERVAL HISTORY: DGhazal Peveyis a 74y.o. with above-mentioned history of right breast cancer treated with neoadjuvant letrozole, lumpectomy, and for which Mammaprint testing revealed she was high risk.She underwent bilateral mastectomiesand is currently on adjuvant chemotherapy withweekly Taxol.[place]   ALLERGIES:  is allergic to lisinopril; alendronate [alendronate]; and hydroxyzine.  MEDICATIONS:  Current Outpatient Medications  Medication Sig Dispense Refill  . amLODipine (NORVASC) 10 MG tablet Take 1 tablet (10 mg total) by mouth daily. MUST SCHEDULE AND KEEP APPOINTMENT FOR REFILLS. 90 tablet 0  . CLOBETASOL PROPIONATE E 0.05 % emollient cream APPLY EXTERNALLY TO THE AFFECTED AREA TWICE DAILY (Patient taking differently: Apply 1 application topically 2 (two) times daily as needed (rash). ) 45 g prn  . dexamethasone (DECADRON) 4 MG tablet Take 1 tablet day after chemo and 1 tablet 2 days after chemo with food 30 tablet 1  . HYDROcodone-acetaminophen (NORCO/VICODIN) 5-325 MG tablet Take 1-2 tablets by mouth every 6 (six) hours as needed for moderate pain. 15 tablet 0  . letrozole (FEMARA) 2.5 MG tablet TAKE 1 TABLET(2.5 MG) BY MOUTH DAILY 90 tablet 0  . lidocaine-prilocaine (EMLA) cream Apply to affected area once 30 g 3  . LORazepam (ATIVAN) 0.5 MG tablet Take 1 tablet (0.5 mg total) by mouth every 6  (six) hours as needed (Nausea or vomiting). 30 tablet 0  . methocarbamol (ROBAXIN) 500 MG tablet Take 1 tablet (500 mg total) by mouth every 6 (six) hours as needed for muscle spasms. 20 tablet 1  . Misc Natural Products (ESSIAC TONIC) CAPS Take 1-2 capsules by mouth 2 (two) times daily. Vegetable    . ondansetron (ZOFRAN) 8 MG tablet Take 1 tablet (8 mg total) by mouth 2 (two) times daily as needed. Start on the third day after chemotherapy. 30 tablet 1  . OVER THE COUNTER MEDICATION Take 2 capsules by mouth 2 (two) times daily as needed (Supplement). TTem[place]   . prochlorperazine (COMPAZINE) 10 MG tablet Take 1 tablet (10 mg total) by mouth every 6 (six) hours as needed (Nausea or vomiting). 30 tablet 1  . simvastatin (ZOCOR) 20 MG tablet Take 1 tablet (20 mg total) by mouth at bedtime. MUST SCHEDULE AND KEEP APPOINTMENT AND HAVE LABS DONE FOR REFILLS 90 tablet 0   No current facility-administered medications for this visit.    PHYSICAL EXAMINATION: ECOG PERFORMANCE STATUS: 1 - Symptomatic but completely ambulatory  Vitals:   07/14/19 1334  BP: 120/77  Pulse: 76  Resp: 19  Temp: 98 F (36.7 C)  SpO2: 100%   Filed Weights   07/14/19 1334  Weight: 134 lb 1.6 oz (60.8 kg)    LABORATORY DATA:  I have reviewed the data as listed CMP Latest Ref Rng & Units 07/07/2019 06/30/2019 06/23/2019  Glucose  70 - 99 mg/dL 122(H) 151(H) 91  BUN 8 - 23 mg/dL 8 8 5(L)  Creatinine 0.44 - 1.00 mg/dL 0.74 0.70 0.62  Sodium 135 - 145 mmol/L 141 141 142  Potassium 3.5 - 5.1 mmol/L 3.4(L) 3.4(L) 3.9  Chloride 98 - 111 mmol/L 109 108 110  CO2 22 - 32 mmol/L '24 24 23  ' Calcium 8.9 - 10.3 mg/dL 8.6(L) 8.7(L) 8.5(L)  Total Protein 6.5 - 8.1 g/dL 6.6 6.5 6.5  Total Bilirubin 0.3 - 1.2 mg/dL 0.6 0.6 0.5  Alkaline Phos 38 - 126 U/L 62 68 66  AST 15 - 41 U/L '17 16 19  ' ALT 0 - 44 U/L '20 18 21    ' Lab Results  Component Value Date   WBC 5.4 07/14/2019   HGB 11.6 (L) 07/14/2019   HCT 35.8 (L)  07/14/2019   MCV 97.8 07/14/2019   PLT 241 07/14/2019   NEUTROABS 3.6 07/14/2019    ASSESSMENT & PLAN:  Malignant neoplasm of upper-outer quadrant of right breast in female, estrogen receptor positive ([place]) 04/23/2018 palpable lump in right breast UOQ, mammogram revealed 2 separate masses. 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, margins negative, 2/3 lymph nodes positive, ER 95%, PR 0%, HER-2 negative, Ki-67 10%  Treatment plan: 1.Neoadjuvant antiestrogen therapy12/02/2018-July 2020 3.12/24/2018 Right lumpectomy: Residual invasive ductal carcinoma 3.8 cm, margins negative, 2/3 lymph nodes positive, ER 95%, PR 0%, HER-2 negative, Ki-67 10%  03/11/2019 bilateral mastectomies [name]):  Left breast: benign tissue and two negative lymph nodes Right breast: residual IDC0.5 cm , clear margins, 2 lymph nodes negative, ER 95%, PR 0%, HER-2 -1+, Ki-67 10%  4.MammaPrint high risk: Adjuvant chemotherapy is being recommended with dose dense Adriamycin and Cytoxan followed by Taxol 5.adjuvant radiation therapy 5.Followed by adjuvant antiestrogen therapy ----------------------------------------------------------------------------------------------------------------------------------------------- clinical trialSWOG [id number] ---------------------------------------------------------------------------------------------------------------------------------- Current Treatment:Completed 4 cycles ofdose dense Adriamycin and  Cytoxan, todayiscycle10Taxol Labs reviewed Echocardiogram9/03/2019: EF 55 to 60%  Chemo toxicities: 1.Headaches:  No further issues 2.Constipation:  Resolved 3.Fatigue after chemotherapy:  Monitoring 4.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  The patient has a good understanding of the overall plan. she agrees with it. she will call with any problems that may develop before the next visit here.  Total time spent: 30 mins including face to face time and time spent for planning, charting and coordination of care  [name], MD 07/14/2019  I, Cloyde Reams Dorshimer, am acting as scribe for Dr. [name].  I have reviewed the above documentation for accuracy and completeness, and I agree with the above.

## 2019-07-14 ENCOUNTER — Inpatient Hospital Stay: Payer: Medicare Other

## 2019-07-14 ENCOUNTER — Inpatient Hospital Stay (HOSPITAL_BASED_OUTPATIENT_CLINIC_OR_DEPARTMENT_OTHER): Payer: Medicare Other | Admitting: Hematology and Oncology

## 2019-07-14 ENCOUNTER — Other Ambulatory Visit: Payer: Self-pay

## 2019-07-14 DIAGNOSIS — C50411 Malignant neoplasm of upper-outer quadrant of right female breast: Secondary | ICD-10-CM

## 2019-07-14 DIAGNOSIS — Z7952 Long term (current) use of systemic steroids: Secondary | ICD-10-CM | POA: Diagnosis not present

## 2019-07-14 DIAGNOSIS — Z170 Estrogen receptor positive status [ER+]: Secondary | ICD-10-CM

## 2019-07-14 DIAGNOSIS — Z5111 Encounter for antineoplastic chemotherapy: Secondary | ICD-10-CM | POA: Diagnosis not present

## 2019-07-14 DIAGNOSIS — Z9013 Acquired absence of bilateral breasts and nipples: Secondary | ICD-10-CM | POA: Diagnosis not present

## 2019-07-14 DIAGNOSIS — Z79811 Long term (current) use of aromatase inhibitors: Secondary | ICD-10-CM | POA: Diagnosis not present

## 2019-07-14 LAB — CBC WITH DIFFERENTIAL (CANCER CENTER ONLY)
Abs Immature Granulocytes: 0.01 10*3/uL (ref 0.00–0.07)
Basophils Absolute: 0 10*3/uL (ref 0.0–0.1)
Basophils Relative: 1 %
Eosinophils Absolute: 0.1 10*3/uL (ref 0.0–0.5)
Eosinophils Relative: 3 %
HCT: 35.8 % — ABNORMAL LOW (ref 36.0–46.0)
Hemoglobin: 11.6 g/dL — ABNORMAL LOW (ref 12.0–15.0)
Immature Granulocytes: 0 %
Lymphocytes Relative: 22 %
Lymphs Abs: 1.2 10*3/uL (ref 0.7–4.0)
MCH: 31.7 pg (ref 26.0–34.0)
MCHC: 32.4 g/dL (ref 30.0–36.0)
MCV: 97.8 fL (ref 80.0–100.0)
Monocytes Absolute: 0.5 10*3/uL (ref 0.1–1.0)
Monocytes Relative: 9 %
Neutro Abs: 3.6 10*3/uL (ref 1.7–7.7)
Neutrophils Relative %: 65 %
Platelet Count: 241 10*3/uL (ref 150–400)
RBC: 3.66 MIL/uL — ABNORMAL LOW (ref 3.87–5.11)
RDW: 16.4 % — ABNORMAL HIGH (ref 11.5–15.5)
WBC Count: 5.4 10*3/uL (ref 4.0–10.5)
nRBC: 0 % (ref 0.0–0.2)

## 2019-07-14 LAB — CMP (CANCER CENTER ONLY)
ALT: 17 U/L (ref 0–44)
AST: 14 U/L — ABNORMAL LOW (ref 15–41)
Albumin: 3.9 g/dL (ref 3.5–5.0)
Alkaline Phosphatase: 60 U/L (ref 38–126)
Anion gap: 8 (ref 5–15)
BUN: 8 mg/dL (ref 8–23)
CO2: 25 mmol/L (ref 22–32)
Calcium: 8.7 mg/dL — ABNORMAL LOW (ref 8.9–10.3)
Chloride: 110 mmol/L (ref 98–111)
Creatinine: 0.63 mg/dL (ref 0.44–1.00)
GFR, Est AFR Am: >60 mL/min (ref >60)
GFR, Estimated: >60 mL/min (ref >60)
Glucose, Bld: 89 mg/dL (ref 70–99)
Potassium: 3.5 mmol/L (ref 3.5–5.1)
Sodium: 143 mmol/L (ref 135–145)
Total Bilirubin: 0.9 mg/dL (ref 0.3–1.2)
Total Protein: 6.7 g/dL (ref 6.5–8.1)

## 2019-07-14 MED ORDER — HEPARIN SOD (PORK) LOCK FLUSH 100 UNIT/ML IV SOLN
500.0000 [IU] | Freq: Once | INTRAVENOUS | Status: AC | PRN
  Administered 2019-07-14: 500 [IU]
  Filled 2019-07-14: qty 5

## 2019-07-14 MED ORDER — SODIUM CHLORIDE 0.9 % IV SOLN
20.0000 mg | Freq: Once | INTRAVENOUS | Status: AC
  Administered 2019-07-14: 20 mg via INTRAVENOUS
  Filled 2019-07-14: qty 20

## 2019-07-14 MED ORDER — DIPHENHYDRAMINE HCL 50 MG/ML IJ SOLN
INTRAMUSCULAR | Status: AC
  Filled 2019-07-14: qty 1

## 2019-07-14 MED ORDER — SODIUM CHLORIDE 0.9 % IV SOLN
65.0000 mg/m2 | Freq: Once | INTRAVENOUS | Status: AC
  Administered 2019-07-14: 108 mg via INTRAVENOUS
  Filled 2019-07-14: qty 18

## 2019-07-14 MED ORDER — DIPHENHYDRAMINE HCL 50 MG/ML IJ SOLN
50.0000 mg | Freq: Once | INTRAMUSCULAR | Status: AC
  Administered 2019-07-14: 50 mg via INTRAVENOUS

## 2019-07-14 MED ORDER — SODIUM CHLORIDE 0.9% FLUSH
10.0000 mL | INTRAVENOUS | Status: DC | PRN
  Administered 2019-07-14: 10 mL
  Filled 2019-07-14: qty 10

## 2019-07-14 MED ORDER — FAMOTIDINE IN NACL 20-0.9 MG/50ML-% IV SOLN
20.0000 mg | Freq: Once | INTRAVENOUS | Status: AC
  Administered 2019-07-14: 20 mg via INTRAVENOUS

## 2019-07-14 MED ORDER — SODIUM CHLORIDE 0.9 % IV SOLN
Freq: Once | INTRAVENOUS | Status: AC
  Filled 2019-07-14: qty 250

## 2019-07-14 MED ORDER — FAMOTIDINE IN NACL 20-0.9 MG/50ML-% IV SOLN
INTRAVENOUS | Status: AC
  Filled 2019-07-14: qty 50

## 2019-07-14 NOTE — Assessment & Plan Note (Signed)
04/23/2018 palpable lump in right breast UOQ, mammogram revealed 2 separate masses. 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, margins negative, 2/3 lymph nodes positive, ER 95%, PR 0%, HER-2 negative, Ki-67 10%  Treatment plan: 1.Neoadjuvant antiestrogen therapy12/02/2018-July 2020 3.12/24/2018 Right lumpectomy: Residual invasive ductal carcinoma 3.8 cm, margins negative, 2/3 lymph nodes positive, ER 95%, PR 0%, HER-2 negative, Ki-67 10%  03/11/2019 bilateral mastectomies [name]):  Left breast: benign tissue and two negative lymph nodes Right breast: residual IDC0.5 cm , clear margins, 2 lymph nodes negative, ER 95%, PR 0%, HER-2 -1+, Ki-67 10%  4.MammaPrint high risk: Adjuvant chemotherapy is being recommended with dose dense Adriamycin and Cytoxan followed by Taxol 5.adjuvant radiation therapy 5.Followed by adjuvant antiestrogen therapy ----------------------------------------------------------------------------------------------------------------------------------------------- clinical trialSWOG 6803 ---------------------------------------------------------------------------------------------------------------------------------- Current Treatment:Completed 4 cycles ofdose dense Adriamycin and Cytoxan, todayiscycle10Taxol Labs reviewed Echocardiogram9/03/2019: EF 55 to 60%  Chemo toxicities: 1.Headaches:  No further issues 2.Constipation:  Resolved 3.Fatigue after chemotherapy:   Monitoring 4.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

## 2019-07-14 NOTE — Patient Instructions (Signed)
[facility] Discharge Instructions for Patients Receiving Chemotherapy  Today you received the following chemotherapy agents :  Taxol.  To help prevent nausea and vomiting after your treatment, we encourage you to take your nausea medication as prescribed.   If you develop nausea and vomiting that is not controlled by your nausea medication, call the clinic.   BELOW ARE SYMPTOMS THAT SHOULD BE REPORTED IMMEDIATELY:  *FEVER GREATER THAN 100.5 F  *CHILLS WITH OR WITHOUT FEVER  NAUSEA AND VOMITING THAT IS NOT CONTROLLED WITH YOUR NAUSEA MEDICATION  *UNUSUAL SHORTNESS OF BREATH  *UNUSUAL BRUISING OR BLEEDING  TENDERNESS IN MOUTH AND THROAT WITH OR WITHOUT PRESENCE OF ULCERS  *URINARY PROBLEMS  *BOWEL PROBLEMS  UNUSUAL RASH Items with * indicate a potential emergency and should be followed up as soon as possible.  Feel free to call the clinic should you have any questions or concerns. The clinic phone number is [phone].  Please show the CHEMO ALERT CARD at check-in to the Emergency Department and triage nurse.   

## 2019-07-21 ENCOUNTER — Other Ambulatory Visit: Payer: Self-pay

## 2019-07-21 ENCOUNTER — Inpatient Hospital Stay: Payer: Medicare Other

## 2019-07-21 VITALS — BP 124/82 | HR 66 | Temp 97.8°F | Resp 17 | Ht 63.0 in | Wt 134.5 lb

## 2019-07-21 DIAGNOSIS — Z170 Estrogen receptor positive status [ER+]: Secondary | ICD-10-CM | POA: Diagnosis not present

## 2019-07-21 DIAGNOSIS — C50411 Malignant neoplasm of upper-outer quadrant of right female breast: Secondary | ICD-10-CM

## 2019-07-21 DIAGNOSIS — Z9013 Acquired absence of bilateral breasts and nipples: Secondary | ICD-10-CM | POA: Diagnosis not present

## 2019-07-21 DIAGNOSIS — Z7952 Long term (current) use of systemic steroids: Secondary | ICD-10-CM | POA: Diagnosis not present

## 2019-07-21 DIAGNOSIS — Z95828 Presence of other vascular implants and grafts: Secondary | ICD-10-CM

## 2019-07-21 DIAGNOSIS — C50011 Malignant neoplasm of nipple and areola, right female breast: Secondary | ICD-10-CM

## 2019-07-21 DIAGNOSIS — Z5111 Encounter for antineoplastic chemotherapy: Secondary | ICD-10-CM | POA: Diagnosis not present

## 2019-07-21 DIAGNOSIS — Z79811 Long term (current) use of aromatase inhibitors: Secondary | ICD-10-CM | POA: Diagnosis not present

## 2019-07-21 LAB — CMP (CANCER CENTER ONLY)
ALT: 17 U/L (ref 0–44)
AST: 14 U/L — ABNORMAL LOW (ref 15–41)
Albumin: 3.9 g/dL (ref 3.5–5.0)
Alkaline Phosphatase: 61 U/L (ref 38–126)
Anion gap: 8 (ref 5–15)
BUN: 9 mg/dL (ref 8–23)
CO2: 25 mmol/L (ref 22–32)
Calcium: 8.7 mg/dL — ABNORMAL LOW (ref 8.9–10.3)
Chloride: 109 mmol/L (ref 98–111)
Creatinine: 0.67 mg/dL (ref 0.44–1.00)
GFR, Est AFR Am: >60 mL/min (ref >60)
GFR, Estimated: >60 mL/min (ref >60)
Glucose, Bld: 91 mg/dL (ref 70–99)
Potassium: 3.6 mmol/L (ref 3.5–5.1)
Sodium: 142 mmol/L (ref 135–145)
Total Bilirubin: 0.6 mg/dL (ref 0.3–1.2)
Total Protein: 6.6 g/dL (ref 6.5–8.1)

## 2019-07-21 LAB — CBC WITH DIFFERENTIAL (CANCER CENTER ONLY)
Abs Immature Granulocytes: 0.01 10*3/uL (ref 0.00–0.07)
Basophils Absolute: 0 10*3/uL (ref 0.0–0.1)
Basophils Relative: 1 %
Eosinophils Absolute: 0.1 10*3/uL (ref 0.0–0.5)
Eosinophils Relative: 2 %
HCT: 35.9 % — ABNORMAL LOW (ref 36.0–46.0)
Hemoglobin: 11.6 g/dL — ABNORMAL LOW (ref 12.0–15.0)
Immature Granulocytes: 0 %
Lymphocytes Relative: 25 %
Lymphs Abs: 1.1 10*3/uL (ref 0.7–4.0)
MCH: 32 pg (ref 26.0–34.0)
MCHC: 32.3 g/dL (ref 30.0–36.0)
MCV: 99.2 fL (ref 80.0–100.0)
Monocytes Absolute: 0.5 10*3/uL (ref 0.1–1.0)
Monocytes Relative: 10 %
Neutro Abs: 2.9 10*3/uL (ref 1.7–7.7)
Neutrophils Relative %: 62 %
Platelet Count: 255 10*3/uL (ref 150–400)
RBC: 3.62 MIL/uL — ABNORMAL LOW (ref 3.87–5.11)
RDW: 15.7 % — ABNORMAL HIGH (ref 11.5–15.5)
WBC Count: 4.6 10*3/uL (ref 4.0–10.5)
nRBC: 0 % (ref 0.0–0.2)

## 2019-07-21 MED ORDER — FAMOTIDINE IN NACL 20-0.9 MG/50ML-% IV SOLN
20.0000 mg | Freq: Once | INTRAVENOUS | Status: AC
  Administered 2019-07-21: 20 mg via INTRAVENOUS

## 2019-07-21 MED ORDER — SODIUM CHLORIDE 0.9% FLUSH
10.0000 mL | INTRAVENOUS | Status: DC | PRN
  Administered 2019-07-21: 10 mL
  Filled 2019-07-21: qty 10

## 2019-07-21 MED ORDER — SODIUM CHLORIDE 0.9 % IV SOLN
20.0000 mg | Freq: Once | INTRAVENOUS | Status: AC
  Administered 2019-07-21: 20 mg via INTRAVENOUS
  Filled 2019-07-21: qty 20

## 2019-07-21 MED ORDER — SODIUM CHLORIDE 0.9% FLUSH
10.0000 mL | Freq: Once | INTRAVENOUS | Status: AC
  Administered 2019-07-21: 10 mL
  Filled 2019-07-21: qty 10

## 2019-07-21 MED ORDER — SODIUM CHLORIDE 0.9 % IV SOLN
65.0000 mg/m2 | Freq: Once | INTRAVENOUS | Status: AC
  Administered 2019-07-21: 108 mg via INTRAVENOUS
  Filled 2019-07-21: qty 18

## 2019-07-21 MED ORDER — FAMOTIDINE IN NACL 20-0.9 MG/50ML-% IV SOLN
INTRAVENOUS | Status: AC
  Filled 2019-07-21: qty 50

## 2019-07-21 MED ORDER — SODIUM CHLORIDE 0.9 % IV SOLN
Freq: Once | INTRAVENOUS | Status: AC
  Filled 2019-07-21: qty 250

## 2019-07-21 MED ORDER — DIPHENHYDRAMINE HCL 50 MG/ML IJ SOLN
50.0000 mg | Freq: Once | INTRAMUSCULAR | Status: AC
  Administered 2019-07-21: 50 mg via INTRAVENOUS

## 2019-07-21 MED ORDER — HEPARIN SOD (PORK) LOCK FLUSH 100 UNIT/ML IV SOLN
500.0000 [IU] | Freq: Once | INTRAVENOUS | Status: AC | PRN
  Administered 2019-07-21: 500 [IU]
  Filled 2019-07-21: qty 5

## 2019-07-21 MED ORDER — DIPHENHYDRAMINE HCL 50 MG/ML IJ SOLN
INTRAMUSCULAR | Status: AC
  Filled 2019-07-21: qty 1

## 2019-07-21 NOTE — Patient Instructions (Signed)
[facility] Discharge Instructions for Patients Receiving Chemotherapy  Today you received the following chemotherapy agents: Paclitaxel (Taxol)  To help prevent nausea and vomiting after your treatment, we encourage you to take your nausea medication as directed.    If you develop nausea and vomiting that is not controlled by your nausea medication, call the clinic.   BELOW ARE SYMPTOMS THAT SHOULD BE REPORTED IMMEDIATELY:  *FEVER GREATER THAN 100.5 F  *CHILLS WITH OR WITHOUT FEVER  NAUSEA AND VOMITING THAT IS NOT CONTROLLED WITH YOUR NAUSEA MEDICATION  *UNUSUAL SHORTNESS OF BREATH  *UNUSUAL BRUISING OR BLEEDING  TENDERNESS IN MOUTH AND THROAT WITH OR WITHOUT PRESENCE OF ULCERS  *URINARY PROBLEMS  *BOWEL PROBLEMS  UNUSUAL RASH Items with * indicate a potential emergency and should be followed up as soon as possible.  Feel free to call the clinic should you have any questions or concerns. The clinic phone number is [phone].  Please show the CHEMO ALERT CARD at check-in to the Emergency Department and triage nurse.  Coronavirus (COVID-19) Are you at risk?  Are you at risk for the Coronavirus (COVID-19)?  To be considered HIGH RISK for Coronavirus (COVID-19), you have to meet the following criteria:  . Traveled to [place], [place], [place], [place] or [place]; or in the [place] to [place], [place], [place], or [place]; and have fever, cough, and shortness of breath within the last 2 weeks of travel OR . Been in close contact with a person diagnosed with COVID-19 within the last 2 weeks and have fever, cough, and shortness of breath . IF YOU DO NOT MEET THESE CRITERIA, YOU ARE CONSIDERED LOW RISK FOR COVID-19.  What to do if you are HIGH RISK for COVID-19?  . If you are having a medical emergency, call 911. . Seek medical care right away. Before you go to a doctor's office, urgent care or emergency department, call ahead and tell them about  your recent travel, contact with someone diagnosed with COVID-19, and your symptoms. You should receive instructions from your physician's office regarding next steps of care.  . When you arrive at healthcare provider, tell the healthcare staff immediately you have returned from visiting [place], [place], [place], [place] or [place]; or traveled in the [place] to [place], [place], [place], or [place]; in the last two weeks or you have been in close contact with a person diagnosed with COVID-19 in the last 2 weeks.   . Tell the health care staff about your symptoms: fever, cough and shortness of breath. . After you have been seen by a medical provider, you will be either: o Tested for (COVID-19) and discharged home on quarantine except to seek medical care if symptoms worsen, and asked to  - Stay home and avoid contact with others until you get your results (4-5 days)  - Avoid travel on public transportation if possible (such as bus, train, or airplane) or o Sent to the Emergency Department by EMS for evaluation, COVID-19 testing, and possible admission depending on your condition and test results.  What to do if you are LOW RISK for COVID-19?  Reduce your risk of any infection by using the same precautions used for avoiding the common cold or flu:  . Wash your hands often with soap and warm water for at least 20 seconds.  If soap and water are not readily available, use an alcohol-based hand sanitizer with at least 60% alcohol.  . If coughing   or sneezing, cover your mouth and nose by coughing or sneezing into the elbow areas of your shirt or coat, into a tissue or into your sleeve (not your hands). . Avoid shaking hands with others and consider head nods or verbal greetings only. . Avoid touching your eyes, nose, or mouth with unwashed hands.  . Avoid close contact with people who are sick. . Avoid places or events with large numbers of people in one location, like concerts or sporting  events. . Carefully consider travel plans you have or are making. . If you are planning any travel outside or inside the [place], visit the CDC's Travelers' Health webpage for the latest health notices. . If you have some symptoms but not all symptoms, continue to monitor at home and seek medical attention if your symptoms worsen. . If you are having a medical emergency, call 911.   ADDITIONAL HEALTHCARE OPTIONS FOR PATIENTS  [facility] / e-Visit: [email or web address]         [facility]: [phone]  [facility]: [phone]                   [facility]: [phone]   

## 2019-07-27 NOTE — Progress Notes (Signed)
Patient Care Team: [name], MD as PCP - General (Family Medicine) [name], RN as Oncology Nurse Navigator [name], RN as Oncology Nurse Navigator  DIAGNOSIS:    ICD-10-CM   1. Malignant neoplasm of upper-outer quadrant of right breast in female, estrogen receptor positive ([place])  C50.411    Z17.0     SUMMARY OF ONCOLOGIC HISTORY: Oncology History  Malignant neoplasm of upper-outer quadrant of right breast in female, estrogen receptor positive ([place])  04/23/2018 Initial Diagnosis   Palpable lump in right breast UOQ, mammogram revealed 2 separate masses.  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  The Multi-Gene Panel offered by [facility] includes sequencing and/or deletion duplication testing of the following 85 genes: AIP, ALK, APC, ATM, AXIN2,BAP1,  BARD1, BLM, BMPR1A, BRCA1, BRCA2, BRIP1, CASR, CDC73, CDH1, CDK4, CDKN1B, CDKN1C, CDKN2A (p14ARF), CDKN2A (p16INK4a), CEBPA, CHEK2, CTNNA1, DICER1, DIS3L2, EGFR (c.2369C>T, p.Thr790Met variant only), EPCAM (Deletion/duplication testing only), FH, FLCN, GATA2, GPC3, GREM1 (Promoter region deletion/duplication testing only), HOXB13 (c.251G>A, p.Gly84Glu), HRAS, KIT, MAX, MEN1, MET, MITF (c.952G>A, p.Glu318Lys variant only), MLH1, MSH2, MSH3, MSH6, MUTYH, NBN, NF1, NF2, NTHL1, PALB2, PDGFRA, PHOX2B, PMS2, POLD1, POLE, POT1, PRKAR1A, PTCH1, PTEN, RAD50, RAD51C, RAD51D, RB1, RECQL4, RET, RNF43, RUNX1,  SDHAF2, SDHA (sequence changes only), SDHB, SDHC, SDHD, SMAD4, SMARCA4, SMARCB1, SMARCE1, STK11, SUFU, TERC, TERT, TMEM127, TP53, TSC1, TSC2, VHL, WRN and WT1.      12/24/2018 Surgery   Right lumpectomy [name]): residual invasive carcinoma, clear margins, and 2/3 lymph nodes positive for carcinoma.    01/14/2019 Cancer Staging   Staging form: Breast, AJCC 8th Edition - Pathologic stage from 01/14/2019: No Stage Recommended (ypT2, pN1a, cM0, G2, ER+, PR-, HER2-) - Signed by [name], MD on 01/14/2019   01/22/2019 Miscellaneous   MammaPrint: High risk luminal type B   03/11/2019 Surgery   Bilateral mastectomies [name]):   Left breast: benign tissue and two negative lymph nodes Right breast: residual IDC 0.5 cm, clear margins, 2 lymph nodes negativeER 95%, PR 0%, HER-2 -1+, Ki-67 10%   04/14/2019 -  Chemotherapy   The patient had DOXOrubicin (ADRIAMYCIN) chemo injection 102 mg, 60 mg/m2 = 102 mg, Intravenous,  Once, 4 of 4 cycles Dose modification: 50 mg/m2 (original dose 60 mg/m2, Cycle 2, Reason: Dose not tolerated) Administration: 102 mg (04/14/2019), 84 mg (04/28/2019), 84 mg (05/12/2019), 84 mg (05/26/2019) palonosetron (ALOXI) injection 0.25 mg, 0.25 mg, Intravenous,  Once, 4 of 4 cycles Administration: 0.25 mg (04/14/2019), 0.25 mg (04/28/2019), 0.25 mg (05/12/2019), 0.25 mg (05/26/2019) pegfilgrastim-jmdb (FULPHILA) injection 6 mg, 6 mg, Subcutaneous,  Once, 4 of 4 cycles Administration: 6 mg (04/16/2019), 6 mg (04/30/2019), 6 mg (05/14/2019), 6 mg (05/28/2019) cyclophosphamide (CYTOXAN) 1,020 mg in sodium chloride 0.9 % 250 mL chemo infusion, 600 mg/m2 = 1,020 mg, Intravenous,  Once, 4 of 4 cycles Dose modification: 500 mg/m2 (original dose 600 mg/m2, Cycle 2, Reason: Dose not tolerated) Administration: 1,020 mg (04/14/2019), 840  mg (04/28/2019), 840 mg (05/12/2019), 840 mg (05/26/2019) PACLitaxel (TAXOL) 138 mg in sodium chloride 0.9 % 250 mL chemo infusion (</= 29m/m2), 80 mg/m2 = 138  mg, Intravenous,  Once, 7 of 12 cycles Dose modification: 65 mg/m2 (original dose 80 mg/m2, Cycle 7, Reason: Dose not tolerated, Comment: neuropathy) Administration: 138 mg (06/09/2019), 108 mg (06/16/2019), 108 mg (06/23/2019), 108 mg (06/30/2019), 108 mg (07/07/2019), 108 mg (07/14/2019), 108 mg (07/21/2019) fosaprepitant (EMEND) 150 mg, dexamethasone (DECADRON) 12 mg in sodium chloride 0.9 % 145 mL IVPB, , Intravenous,  Once, 4 of 4 cycles Administration:  (04/14/2019),  (04/28/2019),  (05/12/2019),  (05/26/2019)  for chemotherapy treatment.      CHIEF COMPLIANT: Cycle8Taxol  INTERVAL HISTORY: DMada Sadikis a 74y.o. with above-mentioned history of right breast cancer treated with neoadjuvant letrozole, lumpectomy, and for which Mammaprint testing revealed she was high risk.She underwent bilateral mastectomiesand is currently on adjuvant chemotherapy withweekly Taxol.She presents to the clinic today forcycle8.  ALLERGIES:  is allergic to lisinopril; alendronate [alendronate]; and hydroxyzine.  MEDICATIONS:  Current Outpatient Medications  Medication Sig Dispense Refill  . amLODipine (NORVASC) 10 MG tablet Take 1 tablet (10 mg total) by mouth daily. MUST SCHEDULE AND KEEP APPOINTMENT FOR REFILLS. 90 tablet 0  . CLOBETASOL PROPIONATE E 0.05 % emollient cream APPLY EXTERNALLY TO THE AFFECTED AREA TWICE DAILY (Patient taking differently: Apply 1 application topically 2 (two) times daily as needed (rash). ) 45 g prn  . dexamethasone (DECADRON) 4 MG tablet Take 1 tablet day after chemo and 1 tablet 2 days after chemo with food 30 tablet 1  . HYDROcodone-acetaminophen (NORCO/VICODIN) 5-325 MG tablet Take 1-2 tablets by mouth every 6 (six) hours as needed for moderate pain. 15 tablet 0  . letrozole (FEMARA) 2.5 MG tablet TAKE 1 TABLET(2.5 MG) BY MOUTH DAILY 90 tablet 0  . lidocaine-prilocaine (EMLA) cream Apply to affected area once 30 g 3  . LORazepam (ATIVAN) 0.5 MG tablet Take 1  tablet (0.5 mg total) by mouth every 6 (six) hours as needed (Nausea or vomiting). 30 tablet 0  . methocarbamol (ROBAXIN) 500 MG tablet Take 1 tablet (500 mg total) by mouth every 6 (six) hours as needed for muscle spasms. 20 tablet 1  . Misc Natural Products (ESSIAC TONIC) CAPS Take 1-2 capsules by mouth 2 (two) times daily. Vegetable    . ondansetron (ZOFRAN) 8 MG tablet Take 1 tablet (8 mg total) by mouth 2 (two) times daily as needed. Start on the third day after chemotherapy. 30 tablet 1  . OVER THE COUNTER MEDICATION Take 2 capsules by mouth 2 (two) times daily as needed (Supplement). T[place]   . prochlorperazine (COMPAZINE) 10 MG tablet Take 1 tablet (10 mg total) by mouth every 6 (six) hours as needed (Nausea or vomiting). 30 tablet 1  . simvastatin (ZOCOR) 20 MG tablet Take 1 tablet (20 mg total) by mouth at bedtime. MUST SCHEDULE AND KEEP APPOINTMENT AND HAVE LABS DONE FOR REFILLS 90 tablet 0   No current facility-administered medications for this visit.    PHYSICAL EXAMINATION: ECOG PERFORMANCE STATUS: 1 - Symptomatic but completely ambulatory  Vitals:   07/28/19 1351  BP: (!) 154/74  Pulse: 89  Resp: 18  Temp: 97.8 F (36.6 C)  SpO2: 100%   Filed Weights   07/28/19 1351  Weight: 134 lb 4.8 oz (60.9 kg)    LABORATORY DATA:  I have reviewed the data as listed CMP Latest Ref Rng & Units 07/21/2019  07/14/2019 07/07/2019  Glucose 70 - 99 mg/dL 91 89 122(H)  BUN 8 - 23 mg/dL '9 8 8  ' Creatinine 0.44 - 1.00 mg/dL 0.67 0.63 0.74  Sodium 135 - 145 mmol/L 142 143 141  Potassium 3.5 - 5.1 mmol/L 3.6 3.5 3.4(L)  Chloride 98 - 111 mmol/L 109 110 109  CO2 22 - 32 mmol/L '25 25 24  ' Calcium 8.9 - 10.3 mg/dL 8.7(L) 8.7(L) 8.6(L)  Total Protein 6.5 - 8.1 g/dL 6.6 6.7 6.6  Total Bilirubin 0.3 - 1.2 mg/dL 0.6 0.9 0.6  Alkaline Phos 38 - 126 U/L 61 60 62  AST 15 - 41 U/L 14(L) 14(L) 17  ALT 0 - 44 U/L '17 17 20    ' Lab Results  Component Value Date   WBC 4.6 07/28/2019   HGB  11.7 (L) 07/28/2019   HCT 36.7 07/28/2019   MCV 100.3 (H) 07/28/2019   PLT 248 07/28/2019   NEUTROABS 2.9 07/28/2019    ASSESSMENT & PLAN:  Malignant neoplasm of upper-outer quadrant of right breast in female, estrogen receptor positive ([place]) 04/23/2018 palpable lump in right breast UOQ, mammogram revealed 2 separate masses. 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, margins negative, 2/3 lymph nodes positive, ER 95%, PR 0%, HER-2 negative, Ki-67 10%  Treatment plan: 1.Neoadjuvant antiestrogen therapy12/02/2018-July 2020 3.12/24/2018 Right lumpectomy: Residual invasive ductal carcinoma 3.8 cm, margins negative, 2/3 lymph nodes positive, ER 95%, PR 0%, HER-2 negative, Ki-67 10%  03/11/2019 bilateral mastectomies [name]):  Left breast: benign tissue and two negative lymph nodes Right breast: residual IDC0.5 cm , clear margins, 2 lymph nodes negative, ER 95%, PR 0%, HER-2 -1+, Ki-67 10%  4.MammaPrint high risk: Adjuvant chemotherapy is being recommended with dose dense Adriamycin and Cytoxan followed by Taxol 5.adjuvant radiation therapy 5.Followed by adjuvant antiestrogen therapy ----------------------------------------------------------------------------------------------------------------------------------------------- clinical trialSWOG [id number] ---------------------------------------------------------------------------------------------------------------------------------- Current Treatment:Completed 4  cycles ofdose dense Adriamycin and Cytoxan, todayiscycle8Taxol Labs reviewed Echocardiogram9/03/2019: EF 55 to 60%  Chemo toxicities: 1.Headaches: No further issues 2.Constipation: Resolved 3.Fatigue after chemotherapy: Monitoring 4.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  The patient has a good understanding of the overall plan. she agrees with it. she will call with any problems that may develop before the next visit here.  Total time spent: 30 mins including face to face time and time spent for planning, charting and coordination of care  [name], MD 07/28/2019  I, Cloyde Reams Dorshimer, am acting as scribe for Dr. [name].  I have reviewed the above documentation for accuracy and completeness, and I agree with the above.

## 2019-07-28 ENCOUNTER — Inpatient Hospital Stay: Payer: Medicare Other

## 2019-07-28 ENCOUNTER — Inpatient Hospital Stay (HOSPITAL_BASED_OUTPATIENT_CLINIC_OR_DEPARTMENT_OTHER): Payer: Medicare Other | Admitting: Hematology and Oncology

## 2019-07-28 ENCOUNTER — Other Ambulatory Visit: Payer: Self-pay

## 2019-07-28 DIAGNOSIS — Z7952 Long term (current) use of systemic steroids: Secondary | ICD-10-CM | POA: Diagnosis not present

## 2019-07-28 DIAGNOSIS — C50411 Malignant neoplasm of upper-outer quadrant of right female breast: Secondary | ICD-10-CM

## 2019-07-28 DIAGNOSIS — Z9013 Acquired absence of bilateral breasts and nipples: Secondary | ICD-10-CM | POA: Diagnosis not present

## 2019-07-28 DIAGNOSIS — Z95828 Presence of other vascular implants and grafts: Secondary | ICD-10-CM

## 2019-07-28 DIAGNOSIS — Z170 Estrogen receptor positive status [ER+]: Secondary | ICD-10-CM

## 2019-07-28 DIAGNOSIS — Z5111 Encounter for antineoplastic chemotherapy: Secondary | ICD-10-CM | POA: Diagnosis not present

## 2019-07-28 DIAGNOSIS — Z79811 Long term (current) use of aromatase inhibitors: Secondary | ICD-10-CM | POA: Diagnosis not present

## 2019-07-28 DIAGNOSIS — C50011 Malignant neoplasm of nipple and areola, right female breast: Secondary | ICD-10-CM

## 2019-07-28 LAB — CBC WITH DIFFERENTIAL (CANCER CENTER ONLY)
Abs Immature Granulocytes: 0.01 10*3/uL (ref 0.00–0.07)
Basophils Absolute: 0 10*3/uL (ref 0.0–0.1)
Basophils Relative: 1 %
Eosinophils Absolute: 0.1 10*3/uL (ref 0.0–0.5)
Eosinophils Relative: 2 %
HCT: 36.7 % (ref 36.0–46.0)
Hemoglobin: 11.7 g/dL — ABNORMAL LOW (ref 12.0–15.0)
Immature Granulocytes: 0 %
Lymphocytes Relative: 26 %
Lymphs Abs: 1.2 10*3/uL (ref 0.7–4.0)
MCH: 32 pg (ref 26.0–34.0)
MCHC: 31.9 g/dL (ref 30.0–36.0)
MCV: 100.3 fL — ABNORMAL HIGH (ref 80.0–100.0)
Monocytes Absolute: 0.4 10*3/uL (ref 0.1–1.0)
Monocytes Relative: 9 %
Neutro Abs: 2.9 10*3/uL (ref 1.7–7.7)
Neutrophils Relative %: 62 %
Platelet Count: 248 10*3/uL (ref 150–400)
RBC: 3.66 MIL/uL — ABNORMAL LOW (ref 3.87–5.11)
RDW: 14.7 % (ref 11.5–15.5)
WBC Count: 4.6 10*3/uL (ref 4.0–10.5)
nRBC: 0 % (ref 0.0–0.2)

## 2019-07-28 LAB — CMP (CANCER CENTER ONLY)
ALT: 16 U/L (ref 0–44)
AST: 14 U/L — ABNORMAL LOW (ref 15–41)
Albumin: 3.7 g/dL (ref 3.5–5.0)
Alkaline Phosphatase: 65 U/L (ref 38–126)
Anion gap: 10 (ref 5–15)
BUN: 7 mg/dL — ABNORMAL LOW (ref 8–23)
CO2: 24 mmol/L (ref 22–32)
Calcium: 8.6 mg/dL — ABNORMAL LOW (ref 8.9–10.3)
Chloride: 108 mmol/L (ref 98–111)
Creatinine: 0.65 mg/dL (ref 0.44–1.00)
GFR, Est AFR Am: >60 mL/min (ref >60)
GFR, Estimated: >60 mL/min (ref >60)
Glucose, Bld: 133 mg/dL — ABNORMAL HIGH (ref 70–99)
Potassium: 3.4 mmol/L — ABNORMAL LOW (ref 3.5–5.1)
Sodium: 142 mmol/L (ref 135–145)
Total Bilirubin: 0.6 mg/dL (ref 0.3–1.2)
Total Protein: 6.5 g/dL (ref 6.5–8.1)

## 2019-07-28 MED ORDER — DIPHENHYDRAMINE HCL 50 MG/ML IJ SOLN
50.0000 mg | Freq: Once | INTRAMUSCULAR | Status: AC
  Administered 2019-07-28: 50 mg via INTRAVENOUS

## 2019-07-28 MED ORDER — FAMOTIDINE IN NACL 20-0.9 MG/50ML-% IV SOLN
INTRAVENOUS | Status: AC
  Filled 2019-07-28: qty 50

## 2019-07-28 MED ORDER — SODIUM CHLORIDE 0.9 % IV SOLN
20.0000 mg | Freq: Once | INTRAVENOUS | Status: AC
  Administered 2019-07-28: 20 mg via INTRAVENOUS
  Filled 2019-07-28: qty 20

## 2019-07-28 MED ORDER — DIPHENHYDRAMINE HCL 50 MG/ML IJ SOLN
INTRAMUSCULAR | Status: AC
  Filled 2019-07-28: qty 1

## 2019-07-28 MED ORDER — HEPARIN SOD (PORK) LOCK FLUSH 100 UNIT/ML IV SOLN
500.0000 [IU] | Freq: Once | INTRAVENOUS | Status: AC | PRN
  Administered 2019-07-28: 500 [IU]
  Filled 2019-07-28: qty 5

## 2019-07-28 MED ORDER — FAMOTIDINE IN NACL 20-0.9 MG/50ML-% IV SOLN
20.0000 mg | Freq: Once | INTRAVENOUS | Status: AC
  Administered 2019-07-28: 20 mg via INTRAVENOUS

## 2019-07-28 MED ORDER — SODIUM CHLORIDE 0.9% FLUSH
10.0000 mL | Freq: Once | INTRAVENOUS | Status: AC
  Administered 2019-07-28: 10 mL
  Filled 2019-07-28: qty 10

## 2019-07-28 MED ORDER — SODIUM CHLORIDE 0.9% FLUSH
10.0000 mL | INTRAVENOUS | Status: DC | PRN
  Administered 2019-07-28: 17:00:00 10 mL
  Filled 2019-07-28: qty 10

## 2019-07-28 MED ORDER — SODIUM CHLORIDE 0.9 % IV SOLN
65.0000 mg/m2 | Freq: Once | INTRAVENOUS | Status: AC
  Administered 2019-07-28: 108 mg via INTRAVENOUS
  Filled 2019-07-28: qty 18

## 2019-07-28 MED ORDER — SODIUM CHLORIDE 0.9 % IV SOLN
Freq: Once | INTRAVENOUS | Status: AC
  Filled 2019-07-28: qty 250

## 2019-07-28 NOTE — Patient Instructions (Signed)
[facility] Discharge Instructions for Patients Receiving Chemotherapy  Today you received the following chemotherapy agents:  Taxol.  To help prevent nausea and vomiting after your treatment, we encourage you to take your nausea medication as directed.   If you develop nausea and vomiting that is not controlled by your nausea medication, call the clinic.   BELOW ARE SYMPTOMS THAT SHOULD BE REPORTED IMMEDIATELY:  *FEVER GREATER THAN 100.5 F  *CHILLS WITH OR WITHOUT FEVER  NAUSEA AND VOMITING THAT IS NOT CONTROLLED WITH YOUR NAUSEA MEDICATION  *UNUSUAL SHORTNESS OF BREATH  *UNUSUAL BRUISING OR BLEEDING  TENDERNESS IN MOUTH AND THROAT WITH OR WITHOUT PRESENCE OF ULCERS  *URINARY PROBLEMS  *BOWEL PROBLEMS  UNUSUAL RASH Items with * indicate a potential emergency and should be followed up as soon as possible.  Feel free to call the clinic should you have any questions or concerns. The clinic phone number is [phone].  Please show the CHEMO ALERT CARD at check-in to the Emergency Department and triage nurse.   

## 2019-07-28 NOTE — Assessment & Plan Note (Signed)
04/23/2018 palpable lump in right breast UOQ, mammogram revealed 2 separate masses. 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, margins negative, 2/3 lymph nodes positive, ER 95%, PR 0%, HER-2 negative, Ki-67 10%  Treatment plan: 1.Neoadjuvant antiestrogen therapy12/02/2018-July 2020 3.12/24/2018 Right lumpectomy: Residual invasive ductal carcinoma 3.8 cm, margins negative, 2/3 lymph nodes positive, ER 95%, PR 0%, HER-2 negative, Ki-67 10%  03/11/2019 bilateral mastectomies Samantha Cross):  Left breast: benign tissue and two negative lymph nodes Right breast: residual IDC0.5 cm , clear margins, 2 lymph nodes negative, ER 95%, PR 0%, HER-2 -1+, Ki-67 10%  4.MammaPrint high risk: Adjuvant chemotherapy is being recommended with dose dense Adriamycin and Cytoxan followed by Taxol 5.adjuvant radiation therapy 5.Followed by adjuvant antiestrogen therapy ----------------------------------------------------------------------------------------------------------------------------------------------- clinical trialSWOG 1610 ---------------------------------------------------------------------------------------------------------------------------------- Current Treatment:Completed 4 cycles ofdose dense Adriamycin and Cytoxan, todayiscycle12Taxol Labs reviewed Echocardiogram9/03/2019: EF 55 to 60%  Chemo toxicities: 1.Headaches: No further issues 2.Constipation: Resolved 3.Fatigue after chemotherapy:  Monitoring 4.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 Patient has appointments with radiation oncology. Return to clinic after radiation to start antiestrogen therapy.

## 2019-07-31 ENCOUNTER — Other Ambulatory Visit: Payer: Self-pay | Admitting: Hematology and Oncology

## 2019-08-03 DIAGNOSIS — Z170 Estrogen receptor positive status [ER+]: Secondary | ICD-10-CM | POA: Diagnosis not present

## 2019-08-03 DIAGNOSIS — C50411 Malignant neoplasm of upper-outer quadrant of right female breast: Secondary | ICD-10-CM | POA: Diagnosis not present

## 2019-08-04 ENCOUNTER — Other Ambulatory Visit: Payer: Self-pay

## 2019-08-04 ENCOUNTER — Inpatient Hospital Stay: Payer: Medicare Other | Attending: Hematology and Oncology

## 2019-08-04 ENCOUNTER — Inpatient Hospital Stay: Payer: Medicare Other

## 2019-08-04 VITALS — BP 116/88 | HR 68 | Temp 98.3°F | Resp 18 | Wt 134.0 lb

## 2019-08-04 DIAGNOSIS — Z79899 Other long term (current) drug therapy: Secondary | ICD-10-CM | POA: Insufficient documentation

## 2019-08-04 DIAGNOSIS — Z9013 Acquired absence of bilateral breasts and nipples: Secondary | ICD-10-CM | POA: Insufficient documentation

## 2019-08-04 DIAGNOSIS — Z170 Estrogen receptor positive status [ER+]: Secondary | ICD-10-CM | POA: Diagnosis not present

## 2019-08-04 DIAGNOSIS — C50411 Malignant neoplasm of upper-outer quadrant of right female breast: Secondary | ICD-10-CM | POA: Insufficient documentation

## 2019-08-04 DIAGNOSIS — Z79811 Long term (current) use of aromatase inhibitors: Secondary | ICD-10-CM | POA: Insufficient documentation

## 2019-08-04 DIAGNOSIS — Z888 Allergy status to other drugs, medicaments and biological substances status: Secondary | ICD-10-CM | POA: Insufficient documentation

## 2019-08-04 DIAGNOSIS — G620 Drug-induced polyneuropathy: Secondary | ICD-10-CM | POA: Insufficient documentation

## 2019-08-04 DIAGNOSIS — Z7952 Long term (current) use of systemic steroids: Secondary | ICD-10-CM | POA: Diagnosis not present

## 2019-08-04 DIAGNOSIS — T451X5A Adverse effect of antineoplastic and immunosuppressive drugs, initial encounter: Secondary | ICD-10-CM | POA: Diagnosis not present

## 2019-08-04 DIAGNOSIS — C50011 Malignant neoplasm of nipple and areola, right female breast: Secondary | ICD-10-CM

## 2019-08-04 DIAGNOSIS — Z5111 Encounter for antineoplastic chemotherapy: Secondary | ICD-10-CM | POA: Diagnosis not present

## 2019-08-04 DIAGNOSIS — Z95828 Presence of other vascular implants and grafts: Secondary | ICD-10-CM

## 2019-08-04 LAB — CBC WITH DIFFERENTIAL (CANCER CENTER ONLY)
Abs Immature Granulocytes: 0.02 10*3/uL (ref 0.00–0.07)
Basophils Absolute: 0 10*3/uL (ref 0.0–0.1)
Basophils Relative: 1 %
Eosinophils Absolute: 0.1 10*3/uL (ref 0.0–0.5)
Eosinophils Relative: 2 %
HCT: 36.9 % (ref 36.0–46.0)
Hemoglobin: 11.7 g/dL — ABNORMAL LOW (ref 12.0–15.0)
Immature Granulocytes: 0 %
Lymphocytes Relative: 25 %
Lymphs Abs: 1.2 10*3/uL (ref 0.7–4.0)
MCH: 32 pg (ref 26.0–34.0)
MCHC: 31.7 g/dL (ref 30.0–36.0)
MCV: 100.8 fL — ABNORMAL HIGH (ref 80.0–100.0)
Monocytes Absolute: 0.5 10*3/uL (ref 0.1–1.0)
Monocytes Relative: 10 %
Neutro Abs: 2.9 10*3/uL (ref 1.7–7.7)
Neutrophils Relative %: 62 %
Platelet Count: 252 10*3/uL (ref 150–400)
RBC: 3.66 MIL/uL — ABNORMAL LOW (ref 3.87–5.11)
RDW: 14.4 % (ref 11.5–15.5)
WBC Count: 4.7 10*3/uL (ref 4.0–10.5)
nRBC: 0 % (ref 0.0–0.2)

## 2019-08-04 LAB — CMP (CANCER CENTER ONLY)
ALT: 15 U/L (ref 0–44)
AST: 15 U/L (ref 15–41)
Albumin: 3.8 g/dL (ref 3.5–5.0)
Alkaline Phosphatase: 60 U/L (ref 38–126)
Anion gap: 8 (ref 5–15)
BUN: 6 mg/dL — ABNORMAL LOW (ref 8–23)
CO2: 25 mmol/L (ref 22–32)
Calcium: 9 mg/dL (ref 8.9–10.3)
Chloride: 110 mmol/L (ref 98–111)
Creatinine: 0.67 mg/dL (ref 0.44–1.00)
GFR, Est AFR Am: >60 mL/min (ref >60)
GFR, Estimated: >60 mL/min (ref >60)
Glucose, Bld: 112 mg/dL — ABNORMAL HIGH (ref 70–99)
Potassium: 3.4 mmol/L — ABNORMAL LOW (ref 3.5–5.1)
Sodium: 143 mmol/L (ref 135–145)
Total Bilirubin: 0.6 mg/dL (ref 0.3–1.2)
Total Protein: 6.6 g/dL (ref 6.5–8.1)

## 2019-08-04 MED ORDER — SODIUM CHLORIDE 0.9 % IV SOLN
Freq: Once | INTRAVENOUS | Status: AC
  Filled 2019-08-04: qty 250

## 2019-08-04 MED ORDER — SODIUM CHLORIDE 0.9 % IV SOLN
20.0000 mg | Freq: Once | INTRAVENOUS | Status: AC
  Administered 2019-08-04: 20 mg via INTRAVENOUS
  Filled 2019-08-04: qty 20

## 2019-08-04 MED ORDER — SODIUM CHLORIDE 0.9% FLUSH
10.0000 mL | Freq: Once | INTRAVENOUS | Status: DC
  Filled 2019-08-04: qty 10

## 2019-08-04 MED ORDER — SODIUM CHLORIDE 0.9% FLUSH
10.0000 mL | INTRAVENOUS | Status: DC | PRN
  Administered 2019-08-04: 10 mL
  Filled 2019-08-04: qty 10

## 2019-08-04 MED ORDER — DIPHENHYDRAMINE HCL 50 MG/ML IJ SOLN
INTRAMUSCULAR | Status: AC
  Filled 2019-08-04: qty 1

## 2019-08-04 MED ORDER — FAMOTIDINE IN NACL 20-0.9 MG/50ML-% IV SOLN
INTRAVENOUS | Status: AC
  Filled 2019-08-04: qty 50

## 2019-08-04 MED ORDER — SODIUM CHLORIDE 0.9 % IV SOLN
65.0000 mg/m2 | Freq: Once | INTRAVENOUS | Status: AC
  Administered 2019-08-04: 108 mg via INTRAVENOUS
  Filled 2019-08-04: qty 18

## 2019-08-04 MED ORDER — DIPHENHYDRAMINE HCL 50 MG/ML IJ SOLN
50.0000 mg | Freq: Once | INTRAMUSCULAR | Status: AC
  Administered 2019-08-04: 50 mg via INTRAVENOUS

## 2019-08-04 MED ORDER — HEPARIN SOD (PORK) LOCK FLUSH 100 UNIT/ML IV SOLN
500.0000 [IU] | Freq: Once | INTRAVENOUS | Status: AC | PRN
  Administered 2019-08-04: 500 [IU]
  Filled 2019-08-04: qty 5

## 2019-08-04 MED ORDER — FAMOTIDINE IN NACL 20-0.9 MG/50ML-% IV SOLN
20.0000 mg | Freq: Once | INTRAVENOUS | Status: AC
  Administered 2019-08-04: 20 mg via INTRAVENOUS

## 2019-08-04 NOTE — Patient Instructions (Signed)
[facility] Discharge Instructions for Patients Receiving Chemotherapy  Today you received the following chemotherapy agents:  Taxol.  To help prevent nausea and vomiting after your treatment, we encourage you to take your nausea medication as directed.   If you develop nausea and vomiting that is not controlled by your nausea medication, call the clinic.   BELOW ARE SYMPTOMS THAT SHOULD BE REPORTED IMMEDIATELY:  *FEVER GREATER THAN 100.5 F  *CHILLS WITH OR WITHOUT FEVER  NAUSEA AND VOMITING THAT IS NOT CONTROLLED WITH YOUR NAUSEA MEDICATION  *UNUSUAL SHORTNESS OF BREATH  *UNUSUAL BRUISING OR BLEEDING  TENDERNESS IN MOUTH AND THROAT WITH OR WITHOUT PRESENCE OF ULCERS  *URINARY PROBLEMS  *BOWEL PROBLEMS  UNUSUAL RASH Items with * indicate a potential emergency and should be followed up as soon as possible.  Feel free to call the clinic should you have any questions or concerns. The clinic phone number is [phone].  Please show the CHEMO ALERT CARD at check-in to the Emergency Department and triage nurse.   

## 2019-08-10 NOTE — Progress Notes (Signed)
Patient Care Team: [name], MD as PCP - General (Family Medicine) [name], RN as Oncology Nurse Navigator [name], RN as Oncology Nurse Navigator  DIAGNOSIS:    ICD-10-CM   1. Malignant neoplasm of upper-outer quadrant of right breast in female, estrogen receptor positive ([place])  C50.411    Z17.0     SUMMARY OF ONCOLOGIC HISTORY: Oncology History  Malignant neoplasm of upper-outer quadrant of right breast in female, estrogen receptor positive ([place])  04/23/2018 Initial Diagnosis   Palpable lump in right breast UOQ, mammogram revealed 2 separate masses.  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  The Multi-Gene Panel offered by [facility] includes sequencing and/or deletion duplication testing of the following 85 genes: AIP, ALK, APC, ATM, AXIN2,BAP1,  BARD1, BLM, BMPR1A, BRCA1, BRCA2, BRIP1, CASR, CDC73, CDH1, CDK4, CDKN1B, CDKN1C, CDKN2A (p14ARF), CDKN2A (p16INK4a), CEBPA, CHEK2, CTNNA1, DICER1, DIS3L2, EGFR (c.2369C>T, p.Thr790Met variant only), EPCAM (Deletion/duplication testing only), FH, FLCN, GATA2, GPC3, GREM1 (Promoter region deletion/duplication testing only), HOXB13 (c.251G>A, p.Gly84Glu), HRAS, KIT, MAX, MEN1, MET, MITF (c.952G>A, p.Glu318Lys variant only), MLH1, MSH2, MSH3, MSH6, MUTYH, NBN, NF1, NF2, NTHL1, PALB2, PDGFRA, PHOX2B, PMS2, POLD1, POLE, POT1, PRKAR1A, PTCH1, PTEN, RAD50, RAD51C, RAD51D, RB1, RECQL4, RET, RNF43, RUNX1,  SDHAF2, SDHA (sequence changes only), SDHB, SDHC, SDHD, SMAD4, SMARCA4, SMARCB1, SMARCE1, STK11, SUFU, TERC, TERT, TMEM127, TP53, TSC1, TSC2, VHL, WRN and WT1.      12/24/2018 Surgery   Right lumpectomy [name]): residual invasive carcinoma, clear margins, and 2/3 lymph nodes positive for carcinoma.    01/14/2019 Cancer Staging   Staging form: Breast, AJCC 8th Edition - Pathologic stage from 01/14/2019: No Stage Recommended (ypT2, pN1a, cM0, G2, ER+, PR-, HER2-) - Signed by [name], MD on 01/14/2019   01/22/2019 Miscellaneous   MammaPrint: High risk luminal type B   03/11/2019 Surgery   Bilateral mastectomies [name]):   Left breast: benign tissue and two negative lymph nodes Right breast: residual IDC 0.5 cm, clear margins, 2 lymph nodes negativeER 95%, PR 0%, HER-2 -1+, Ki-67 10%   04/14/2019 -  Chemotherapy   The patient had DOXOrubicin (ADRIAMYCIN) chemo injection 102 mg, 60 mg/m2 = 102 mg, Intravenous,  Once, 4 of 4 cycles Dose modification: 50 mg/m2 (original dose 60 mg/m2, Cycle 2, Reason: Dose not tolerated) Administration: 102 mg (04/14/2019), 84 mg (04/28/2019), 84 mg (05/12/2019), 84 mg (05/26/2019) palonosetron (ALOXI) injection 0.25 mg, 0.25 mg, Intravenous,  Once, 4 of 4 cycles Administration: 0.25 mg (04/14/2019), 0.25 mg (04/28/2019), 0.25 mg (05/12/2019), 0.25 mg (05/26/2019) pegfilgrastim-jmdb (FULPHILA) injection 6 mg, 6 mg, Subcutaneous,  Once, 4 of 4 cycles Administration: 6 mg (04/16/2019), 6 mg (04/30/2019), 6 mg (05/14/2019), 6 mg (05/28/2019) cyclophosphamide (CYTOXAN) 1,020 mg in sodium chloride 0.9 % 250 mL chemo infusion, 600 mg/m2 = 1,020 mg, Intravenous,  Once, 4 of 4 cycles Dose modification: 500 mg/m2 (original dose 600 mg/m2, Cycle 2, Reason: Dose not tolerated) Administration: 1,020 mg (04/14/2019), 840  mg (04/28/2019), 840 mg (05/12/2019), 840 mg (05/26/2019) PACLitaxel (TAXOL) 138 mg in sodium chloride 0.9 % 250 mL chemo infusion (</= 78m/m2), 80 mg/m2 = 138  mg, Intravenous,  Once, 9 of 12 cycles Dose modification: 65 mg/m2 (original dose 80 mg/m2, Cycle 7, Reason: Dose not tolerated, Comment: neuropathy) Administration: 138 mg (06/09/2019), 108 mg (06/16/2019), 108 mg (06/23/2019), 108 mg (06/30/2019), 108 mg (07/07/2019), 108 mg (07/14/2019), 108 mg (07/21/2019), 108 mg (07/28/2019), 108 mg (08/04/2019) fosaprepitant (EMEND) 150 mg, dexamethasone (DECADRON) 12 mg in sodium chloride 0.9 % 145 mL IVPB, , Intravenous,  Once, 4 of 4 cycles Administration:  (04/14/2019),  (04/28/2019),  (05/12/2019),  (05/26/2019)  for chemotherapy treatment.      CHIEF COMPLIANT: Cycle10Taxol  INTERVAL HISTORY: Samantha Wardropis a 74y.o. with above-mentioned history of right breast cancer treated with neoadjuvant letrozole, lumpectomy, and for which Mammaprint testing revealed she was high risk.She underwent bilateral mastectomiesand is currently on adjuvant chemotherapy withweekly Taxol.She presents to the clinic today forcycle10. She is tolerating chemo extremely well without any nausea or vomiting.  Her biggest complaint is related to the nailbeds turning yellow and looking inward.  Very minimal to no neuropathy.  ALLERGIES:  is allergic to lisinopril; alendronate [alendronate]; and hydroxyzine.  MEDICATIONS:  Current Outpatient Medications  Medication Sig Dispense Refill   amLODipine (NORVASC) 10 MG tablet Take 1 tablet (10 mg total) by mouth daily. MUST SCHEDULE AND KEEP APPOINTMENT FOR REFILLS. 90 tablet 0   CLOBETASOL PROPIONATE E 0.05 % emollient cream APPLY EXTERNALLY TO THE AFFECTED AREA TWICE DAILY (Patient taking differently: Apply 1 application topically 2 (two) times daily as needed (rash). ) 45 g prn   dexamethasone (DECADRON) 4 MG tablet Take 1 tablet day after chemo and 1 tablet 2 days after chemo with food 30 tablet 1   HYDROcodone-acetaminophen (NORCO/VICODIN) 5-325 MG tablet Take 1-2 tablets by mouth every 6 (six) hours as needed for  moderate pain. 15 tablet 0   letrozole (FEMARA) 2.5 MG tablet TAKE 1 TABLET(2.5 MG) BY MOUTH DAILY 90 tablet 2   lidocaine-prilocaine (EMLA) cream Apply to affected area once 30 g 3   LORazepam (ATIVAN) 0.5 MG tablet Take 1 tablet (0.5 mg total) by mouth every 6 (six) hours as needed (Nausea or vomiting). 30 tablet 0   methocarbamol (ROBAXIN) 500 MG tablet Take 1 tablet (500 mg total) by mouth every 6 (six) hours as needed for muscle spasms. 20 tablet 1   Misc Natural Products (ESSIAC TONIC) CAPS Take 1-2 capsules by mouth 2 (two) times daily. Vegetable     ondansetron (ZOFRAN) 8 MG tablet Take 1 tablet (8 mg total) by mouth 2 (two) times daily as needed. Start on the third day after chemotherapy. 30 tablet 1   OVER THE COUNTER MEDICATION Take 2 capsules by mouth 2 (two) times daily as needed (Supplement). [employer]     prochlorperazine (COMPAZINE) 10 MG tablet Take 1 tablet (10 mg total) by mouth every 6 (six) hours as needed (Nausea or vomiting). 30 tablet 1   simvastatin (ZOCOR) 20 MG tablet Take 1 tablet (20 mg total) by mouth at bedtime. MUST SCHEDULE AND KEEP APPOINTMENT AND HAVE LABS DONE FOR REFILLS 90 tablet 0   No current facility-administered medications for this visit.    PHYSICAL EXAMINATION: ECOG PERFORMANCE STATUS: 2 - Symptomatic, <50% confined to bed  Vitals:   08/11/19 1318  BP: 138/76  Pulse: 80  Resp: 18  Temp: 99.1 F (37.3 C)  SpO2:  100%   Filed Weights   08/11/19 1318  Weight: 135 lb 9.6 oz (61.5 kg)    LABORATORY DATA:  I have reviewed the data as listed CMP Latest Ref Rng & Units 08/11/2019 08/04/2019 07/28/2019  Glucose 70 - 99 mg/dL 91 112(H) 133(H)  BUN 8 - 23 mg/dL 8 6(L) 7(L)  Creatinine 0.44 - 1.00 mg/dL 0.62 0.67 0.65  Sodium 135 - 145 mmol/L 141 143 142  Potassium 3.5 - 5.1 mmol/L 3.5 3.4(L) 3.4(L)  Chloride 98 - 111 mmol/L 108 110 108  CO2 22 - 32 mmol/L '24 25 24  ' Calcium 8.9 - 10.3 mg/dL 8.7(L) 9.0 8.6(L)  Total Protein 6.5 - 8.1  g/dL 6.4(L) 6.6 6.5  Total Bilirubin 0.3 - 1.2 mg/dL 0.6 0.6 0.6  Alkaline Phos 38 - 126 U/L 64 60 65  AST 15 - 41 U/L 13(L) 15 14(L)  ALT 0 - 44 U/L '13 15 16    ' Lab Results  Component Value Date   WBC 4.7 08/11/2019   HGB 12.3 08/11/2019   HCT 37.8 08/11/2019   MCV 99.5 08/11/2019   PLT 256 08/11/2019   NEUTROABS 2.9 08/11/2019    ASSESSMENT & PLAN:  Malignant neoplasm of upper-outer quadrant of right breast in female, estrogen receptor positive ([place]) 04/23/2018 palpable lump in right breast UOQ, mammogram revealed 2 separate masses. 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, margins negative, 2/3 lymph nodes positive, ER 95%, PR 0%, HER-2 negative, Ki-67 10%  Treatment plan: 1.Neoadjuvant antiestrogen therapy12/02/2018-July 2020 3.12/24/2018 Right lumpectomy: Residual invasive ductal carcinoma 3.8 cm, margins negative, 2/3 lymph nodes positive, ER 95%, PR 0%, HER-2 negative, Ki-67 10%  03/11/2019 bilateral mastectomies [name]):  Left breast: benign tissue and two negative lymph nodes Right breast: residual IDC0.5 cm , clear margins, 2 lymph nodes negative, ER 95%, PR 0%, HER-2 -1+, Ki-67 10%  4.MammaPrint high risk: Adjuvant chemotherapy is being recommended with dose dense Adriamycin and Cytoxan followed by Taxol 5.adjuvant radiation therapy 5.Followed by adjuvant antiestrogen  therapy ----------------------------------------------------------------------------------------------------------------------------------------------- clinical trialSWOG 2703 ---------------------------------------------------------------------------------------------------------------------------------- Current Treatment:Completed 4 cycles ofdose dense Adriamycin and Cytoxan, todayiscycle10Taxol Labs reviewed Echocardiogram9/03/2019: EF 55 to 60%  Chemo toxicities: 1.Headaches:No further issues 2.Constipation:Resolved 3.Fatigue after chemotherapy:Monitoring 4.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 I will request radiation oncology evaluation afterwards for adjuvant radiation.   No orders of the defined types were placed in this encounter.  The patient has a good understanding of the overall plan. she agrees with it. she will call with any problems that may develop before the next visit here.  Total time spent: 30 mins including face to face time and time spent for planning, charting and coordination of care  [name], MD 08/11/2019  I, Cloyde Reams Dorshimer, am acting as scribe for Dr. [name].  I have reviewed the above documentation for accuracy and completeness, and I agree with the above.

## 2019-08-11 ENCOUNTER — Inpatient Hospital Stay: Payer: Medicare Other

## 2019-08-11 ENCOUNTER — Other Ambulatory Visit: Payer: Self-pay

## 2019-08-11 ENCOUNTER — Encounter: Payer: Self-pay | Admitting: *Deleted

## 2019-08-11 ENCOUNTER — Inpatient Hospital Stay (HOSPITAL_BASED_OUTPATIENT_CLINIC_OR_DEPARTMENT_OTHER): Payer: Medicare Other | Admitting: Hematology and Oncology

## 2019-08-11 DIAGNOSIS — T451X5A Adverse effect of antineoplastic and immunosuppressive drugs, initial encounter: Secondary | ICD-10-CM | POA: Diagnosis not present

## 2019-08-11 DIAGNOSIS — C50411 Malignant neoplasm of upper-outer quadrant of right female breast: Secondary | ICD-10-CM

## 2019-08-11 DIAGNOSIS — Z95828 Presence of other vascular implants and grafts: Secondary | ICD-10-CM

## 2019-08-11 DIAGNOSIS — C50011 Malignant neoplasm of nipple and areola, right female breast: Secondary | ICD-10-CM

## 2019-08-11 DIAGNOSIS — Z170 Estrogen receptor positive status [ER+]: Secondary | ICD-10-CM

## 2019-08-11 DIAGNOSIS — Z5111 Encounter for antineoplastic chemotherapy: Secondary | ICD-10-CM | POA: Diagnosis not present

## 2019-08-11 DIAGNOSIS — G620 Drug-induced polyneuropathy: Secondary | ICD-10-CM | POA: Diagnosis not present

## 2019-08-11 DIAGNOSIS — Z7952 Long term (current) use of systemic steroids: Secondary | ICD-10-CM | POA: Diagnosis not present

## 2019-08-11 LAB — CBC WITH DIFFERENTIAL (CANCER CENTER ONLY)
Abs Immature Granulocytes: 0.02 10*3/uL (ref 0.00–0.07)
Basophils Absolute: 0.1 10*3/uL (ref 0.0–0.1)
Basophils Relative: 1 %
Eosinophils Absolute: 0.1 10*3/uL (ref 0.0–0.5)
Eosinophils Relative: 2 %
HCT: 37.8 % (ref 36.0–46.0)
Hemoglobin: 12.3 g/dL (ref 12.0–15.0)
Immature Granulocytes: 0 %
Lymphocytes Relative: 25 %
Lymphs Abs: 1.2 10*3/uL (ref 0.7–4.0)
MCH: 32.4 pg (ref 26.0–34.0)
MCHC: 32.5 g/dL (ref 30.0–36.0)
MCV: 99.5 fL (ref 80.0–100.0)
Monocytes Absolute: 0.5 10*3/uL (ref 0.1–1.0)
Monocytes Relative: 10 %
Neutro Abs: 2.9 10*3/uL (ref 1.7–7.7)
Neutrophils Relative %: 62 %
Platelet Count: 256 10*3/uL (ref 150–400)
RBC: 3.8 MIL/uL — ABNORMAL LOW (ref 3.87–5.11)
RDW: 14.2 % (ref 11.5–15.5)
WBC Count: 4.7 10*3/uL (ref 4.0–10.5)
nRBC: 0 % (ref 0.0–0.2)

## 2019-08-11 LAB — CMP (CANCER CENTER ONLY)
ALT: 13 U/L (ref 0–44)
AST: 13 U/L — ABNORMAL LOW (ref 15–41)
Albumin: 3.9 g/dL (ref 3.5–5.0)
Alkaline Phosphatase: 64 U/L (ref 38–126)
Anion gap: 9 (ref 5–15)
BUN: 8 mg/dL (ref 8–23)
CO2: 24 mmol/L (ref 22–32)
Calcium: 8.7 mg/dL — ABNORMAL LOW (ref 8.9–10.3)
Chloride: 108 mmol/L (ref 98–111)
Creatinine: 0.62 mg/dL (ref 0.44–1.00)
GFR, Est AFR Am: >60 mL/min (ref >60)
GFR, Estimated: >60 mL/min (ref >60)
Glucose, Bld: 91 mg/dL (ref 70–99)
Potassium: 3.5 mmol/L (ref 3.5–5.1)
Sodium: 141 mmol/L (ref 135–145)
Total Bilirubin: 0.6 mg/dL (ref 0.3–1.2)
Total Protein: 6.4 g/dL — ABNORMAL LOW (ref 6.5–8.1)

## 2019-08-11 MED ORDER — SODIUM CHLORIDE 0.9% FLUSH
10.0000 mL | INTRAVENOUS | Status: DC | PRN
  Administered 2019-08-11: 10 mL
  Filled 2019-08-11: qty 10

## 2019-08-11 MED ORDER — SODIUM CHLORIDE 0.9% FLUSH
10.0000 mL | Freq: Once | INTRAVENOUS | Status: AC
  Administered 2019-08-11: 10 mL
  Filled 2019-08-11: qty 10

## 2019-08-11 MED ORDER — DIPHENHYDRAMINE HCL 50 MG/ML IJ SOLN
50.0000 mg | Freq: Once | INTRAMUSCULAR | Status: AC
  Administered 2019-08-11: 50 mg via INTRAVENOUS

## 2019-08-11 MED ORDER — SODIUM CHLORIDE 0.9 % IV SOLN
20.0000 mg | Freq: Once | INTRAVENOUS | Status: AC
  Administered 2019-08-11: 20 mg via INTRAVENOUS
  Filled 2019-08-11: qty 20

## 2019-08-11 MED ORDER — FAMOTIDINE IN NACL 20-0.9 MG/50ML-% IV SOLN
INTRAVENOUS | Status: AC
  Filled 2019-08-11: qty 50

## 2019-08-11 MED ORDER — SODIUM CHLORIDE 0.9 % IV SOLN
Freq: Once | INTRAVENOUS | Status: AC
  Filled 2019-08-11: qty 250

## 2019-08-11 MED ORDER — FAMOTIDINE IN NACL 20-0.9 MG/50ML-% IV SOLN
20.0000 mg | Freq: Once | INTRAVENOUS | Status: AC
  Administered 2019-08-11: 20 mg via INTRAVENOUS

## 2019-08-11 MED ORDER — HEPARIN SOD (PORK) LOCK FLUSH 100 UNIT/ML IV SOLN
500.0000 [IU] | Freq: Once | INTRAVENOUS | Status: AC | PRN
  Administered 2019-08-11: 500 [IU]
  Filled 2019-08-11: qty 5

## 2019-08-11 MED ORDER — SODIUM CHLORIDE 0.9 % IV SOLN
65.0000 mg/m2 | Freq: Once | INTRAVENOUS | Status: AC
  Administered 2019-08-11: 108 mg via INTRAVENOUS
  Filled 2019-08-11: qty 18

## 2019-08-11 MED ORDER — DIPHENHYDRAMINE HCL 50 MG/ML IJ SOLN
INTRAMUSCULAR | Status: AC
  Filled 2019-08-11: qty 1

## 2019-08-11 NOTE — Patient Instructions (Signed)
[facility] Discharge Instructions for Patients Receiving Chemotherapy  Today you received the following chemotherapy agents:  Taxol.  To help prevent nausea and vomiting after your treatment, we encourage you to take your nausea medication as directed.   If you develop nausea and vomiting that is not controlled by your nausea medication, call the clinic.   BELOW ARE SYMPTOMS THAT SHOULD BE REPORTED IMMEDIATELY:  *FEVER GREATER THAN 100.5 F  *CHILLS WITH OR WITHOUT FEVER  NAUSEA AND VOMITING THAT IS NOT CONTROLLED WITH YOUR NAUSEA MEDICATION  *UNUSUAL SHORTNESS OF BREATH  *UNUSUAL BRUISING OR BLEEDING  TENDERNESS IN MOUTH AND THROAT WITH OR WITHOUT PRESENCE OF ULCERS  *URINARY PROBLEMS  *BOWEL PROBLEMS  UNUSUAL RASH Items with * indicate a potential emergency and should be followed up as soon as possible.  Feel free to call the clinic should you have any questions or concerns. The clinic phone number is [phone].  Please show the CHEMO ALERT CARD at check-in to the Emergency Department and triage nurse.   

## 2019-08-11 NOTE — Assessment & Plan Note (Addendum)
04/23/2018 palpable lump in right breast UOQ, mammogram revealed 2 separate masses. 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, margins negative, 2/3 lymph nodes positive, ER 95%, PR 0%, HER-2 negative, Ki-67 10%  Treatment plan: 1.Neoadjuvant antiestrogen therapy12/02/2018-July 2020 3.12/24/2018 Right lumpectomy: Residual invasive ductal carcinoma 3.8 cm, margins negative, 2/3 lymph nodes positive, ER 95%, PR 0%, HER-2 negative, Ki-67 10%  03/11/2019 bilateral mastectomies [name]):  Left breast: benign tissue and two negative lymph nodes Right breast: residual IDC0.5 cm , clear margins, 2 lymph nodes negative, ER 95%, PR 0%, HER-2 -1+, Ki-67 10%  4.MammaPrint high risk: Adjuvant chemotherapy is being recommended with dose dense Adriamycin and Cytoxan followed by Taxol 5.adjuvant radiation therapy 5.Followed by adjuvant antiestrogen therapy ----------------------------------------------------------------------------------------------------------------------------------------------- clinical trialSWOG 0354 ---------------------------------------------------------------------------------------------------------------------------------- Current Treatment:Completed 4 cycles ofdose dense Adriamycin and Cytoxan, todayiscycle10Taxol Labs reviewed Echocardiogram9/03/2019: EF 55 to 60%  Chemo toxicities: 1.Headaches:No further issues 2.Constipation:Resolved 3.Fatigue after  chemotherapy:Monitoring 4.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 I will request radiation oncology evaluation afterwards for adjuvant radiation.

## 2019-08-18 ENCOUNTER — Other Ambulatory Visit: Payer: Self-pay

## 2019-08-18 ENCOUNTER — Inpatient Hospital Stay: Payer: Medicare Other

## 2019-08-18 VITALS — BP 128/77 | HR 70 | Temp 98.5°F | Resp 17

## 2019-08-18 DIAGNOSIS — Z95828 Presence of other vascular implants and grafts: Secondary | ICD-10-CM

## 2019-08-18 DIAGNOSIS — Z170 Estrogen receptor positive status [ER+]: Secondary | ICD-10-CM

## 2019-08-18 DIAGNOSIS — T451X5A Adverse effect of antineoplastic and immunosuppressive drugs, initial encounter: Secondary | ICD-10-CM | POA: Diagnosis not present

## 2019-08-18 DIAGNOSIS — G620 Drug-induced polyneuropathy: Secondary | ICD-10-CM | POA: Diagnosis not present

## 2019-08-18 DIAGNOSIS — Z7952 Long term (current) use of systemic steroids: Secondary | ICD-10-CM | POA: Diagnosis not present

## 2019-08-18 DIAGNOSIS — C50411 Malignant neoplasm of upper-outer quadrant of right female breast: Secondary | ICD-10-CM

## 2019-08-18 DIAGNOSIS — Z5111 Encounter for antineoplastic chemotherapy: Secondary | ICD-10-CM | POA: Diagnosis not present

## 2019-08-18 DIAGNOSIS — C50011 Malignant neoplasm of nipple and areola, right female breast: Secondary | ICD-10-CM

## 2019-08-18 LAB — CMP (CANCER CENTER ONLY)
ALT: 14 U/L (ref 0–44)
AST: 13 U/L — ABNORMAL LOW (ref 15–41)
Albumin: 3.7 g/dL (ref 3.5–5.0)
Alkaline Phosphatase: 65 U/L (ref 38–126)
Anion gap: 9 (ref 5–15)
BUN: 7 mg/dL — ABNORMAL LOW (ref 8–23)
CO2: 24 mmol/L (ref 22–32)
Calcium: 8.9 mg/dL (ref 8.9–10.3)
Chloride: 109 mmol/L (ref 98–111)
Creatinine: 0.66 mg/dL (ref 0.44–1.00)
GFR, Est AFR Am: >60 mL/min (ref >60)
GFR, Estimated: >60 mL/min (ref >60)
Glucose, Bld: 105 mg/dL — ABNORMAL HIGH (ref 70–99)
Potassium: 3.6 mmol/L (ref 3.5–5.1)
Sodium: 142 mmol/L (ref 135–145)
Total Bilirubin: 0.6 mg/dL (ref 0.3–1.2)
Total Protein: 6.5 g/dL (ref 6.5–8.1)

## 2019-08-18 LAB — CBC WITH DIFFERENTIAL (CANCER CENTER ONLY)
Abs Immature Granulocytes: 0.03 10*3/uL (ref 0.00–0.07)
Basophils Absolute: 0.1 10*3/uL (ref 0.0–0.1)
Basophils Relative: 1 %
Eosinophils Absolute: 0.1 10*3/uL (ref 0.0–0.5)
Eosinophils Relative: 2 %
HCT: 37.3 % (ref 36.0–46.0)
Hemoglobin: 12.1 g/dL (ref 12.0–15.0)
Immature Granulocytes: 1 %
Lymphocytes Relative: 20 %
Lymphs Abs: 1 10*3/uL (ref 0.7–4.0)
MCH: 32.4 pg (ref 26.0–34.0)
MCHC: 32.4 g/dL (ref 30.0–36.0)
MCV: 99.7 fL (ref 80.0–100.0)
Monocytes Absolute: 0.5 10*3/uL (ref 0.1–1.0)
Monocytes Relative: 11 %
Neutro Abs: 3.2 10*3/uL (ref 1.7–7.7)
Neutrophils Relative %: 65 %
Platelet Count: 264 10*3/uL (ref 150–400)
RBC: 3.74 MIL/uL — ABNORMAL LOW (ref 3.87–5.11)
RDW: 13.9 % (ref 11.5–15.5)
WBC Count: 4.9 10*3/uL (ref 4.0–10.5)
nRBC: 0 % (ref 0.0–0.2)

## 2019-08-18 MED ORDER — DIPHENHYDRAMINE HCL 50 MG/ML IJ SOLN
50.0000 mg | Freq: Once | INTRAMUSCULAR | Status: AC
  Administered 2019-08-18: 15:00:00 50 mg via INTRAVENOUS

## 2019-08-18 MED ORDER — FAMOTIDINE IN NACL 20-0.9 MG/50ML-% IV SOLN
INTRAVENOUS | Status: AC
  Filled 2019-08-18: qty 50

## 2019-08-18 MED ORDER — HEPARIN SOD (PORK) LOCK FLUSH 100 UNIT/ML IV SOLN
500.0000 [IU] | Freq: Once | INTRAVENOUS | Status: AC | PRN
  Administered 2019-08-18: 17:00:00 500 [IU]
  Filled 2019-08-18: qty 5

## 2019-08-18 MED ORDER — DIPHENHYDRAMINE HCL 50 MG/ML IJ SOLN
INTRAMUSCULAR | Status: AC
  Filled 2019-08-18: qty 1

## 2019-08-18 MED ORDER — SODIUM CHLORIDE 0.9 % IV SOLN
Freq: Once | INTRAVENOUS | Status: AC
  Filled 2019-08-18: qty 250

## 2019-08-18 MED ORDER — HEPARIN SOD (PORK) LOCK FLUSH 100 UNIT/ML IV SOLN
500.0000 [IU] | Freq: Once | INTRAVENOUS | Status: DC
  Filled 2019-08-18: qty 5

## 2019-08-18 MED ORDER — SODIUM CHLORIDE 0.9% FLUSH
10.0000 mL | Freq: Once | INTRAVENOUS | Status: AC
  Administered 2019-08-18: 10 mL
  Filled 2019-08-18: qty 10

## 2019-08-18 MED ORDER — SODIUM CHLORIDE 0.9 % IV SOLN
65.0000 mg/m2 | Freq: Once | INTRAVENOUS | Status: AC
  Administered 2019-08-18: 108 mg via INTRAVENOUS
  Filled 2019-08-18: qty 18

## 2019-08-18 MED ORDER — SODIUM CHLORIDE 0.9% FLUSH
10.0000 mL | INTRAVENOUS | Status: DC | PRN
  Administered 2019-08-18: 17:00:00 10 mL
  Filled 2019-08-18: qty 10

## 2019-08-18 MED ORDER — FAMOTIDINE IN NACL 20-0.9 MG/50ML-% IV SOLN
20.0000 mg | Freq: Once | INTRAVENOUS | Status: AC
  Administered 2019-08-18: 20 mg via INTRAVENOUS

## 2019-08-18 MED ORDER — SODIUM CHLORIDE 0.9 % IV SOLN
20.0000 mg | Freq: Once | INTRAVENOUS | Status: AC
  Administered 2019-08-18: 20 mg via INTRAVENOUS
  Filled 2019-08-18: qty 20

## 2019-08-18 NOTE — Patient Instructions (Signed)
[facility] Discharge Instructions for Patients Receiving Chemotherapy  Today you received the following chemotherapy agents:  Taxol.  To help prevent nausea and vomiting after your treatment, we encourage you to take your nausea medication as directed.   If you develop nausea and vomiting that is not controlled by your nausea medication, call the clinic.   BELOW ARE SYMPTOMS THAT SHOULD BE REPORTED IMMEDIATELY:  *FEVER GREATER THAN 100.5 F  *CHILLS WITH OR WITHOUT FEVER  NAUSEA AND VOMITING THAT IS NOT CONTROLLED WITH YOUR NAUSEA MEDICATION  *UNUSUAL SHORTNESS OF BREATH  *UNUSUAL BRUISING OR BLEEDING  TENDERNESS IN MOUTH AND THROAT WITH OR WITHOUT PRESENCE OF ULCERS  *URINARY PROBLEMS  *BOWEL PROBLEMS  UNUSUAL RASH Items with * indicate a potential emergency and should be followed up as soon as possible.  Feel free to call the clinic should you have any questions or concerns. The clinic phone number is [phone].  Please show the CHEMO ALERT CARD at check-in to the Emergency Department and triage nurse.   

## 2019-08-24 NOTE — Progress Notes (Signed)
Patient Care Team: [name], MD as PCP - General (Family Medicine) [name], RN as Oncology Nurse Navigator [name], RN as Oncology Nurse Navigator  DIAGNOSIS:    ICD-10-CM   1. Malignant neoplasm of upper-outer quadrant of right breast in female, estrogen receptor positive ([place])  C50.411    Z17.0     SUMMARY OF ONCOLOGIC HISTORY: Oncology History  Malignant neoplasm of upper-outer quadrant of right breast in female, estrogen receptor positive ([place])  04/23/2018 Initial Diagnosis   Palpable lump in right breast UOQ, mammogram revealed 2 separate masses.  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  The Multi-Gene Panel offered by [facility] includes sequencing and/or deletion duplication testing of the following 85 genes: AIP, ALK, APC, ATM, AXIN2,BAP1,  BARD1, BLM, BMPR1A, BRCA1, BRCA2, BRIP1, CASR, CDC73, CDH1, CDK4, CDKN1B, CDKN1C, CDKN2A (p14ARF), CDKN2A (p16INK4a), CEBPA, CHEK2, CTNNA1, DICER1, DIS3L2, EGFR (c.2369C>T, p.Thr790Met variant only), EPCAM (Deletion/duplication testing only), FH, FLCN, GATA2, GPC3, GREM1 (Promoter region deletion/duplication testing only), HOXB13 (c.251G>A, p.Gly84Glu), HRAS, KIT, MAX, MEN1, MET, MITF (c.952G>A, p.Glu318Lys variant only), MLH1, MSH2, MSH3, MSH6, MUTYH, NBN, NF1, NF2, NTHL1, PALB2, PDGFRA, PHOX2B, PMS2, POLD1, POLE, POT1, PRKAR1A, PTCH1, PTEN, RAD50, RAD51C, RAD51D, RB1, RECQL4, RET, RNF43, RUNX1,  SDHAF2, SDHA (sequence changes only), SDHB, SDHC, SDHD, SMAD4, SMARCA4, SMARCB1, SMARCE1, STK11, SUFU, TERC, TERT, TMEM127, TP53, TSC1, TSC2, VHL, WRN and WT1.      12/24/2018 Surgery   Right lumpectomy [name]): residual invasive carcinoma, clear margins, and 2/3 lymph nodes positive for carcinoma.    01/14/2019 Cancer Staging   Staging form: Breast, AJCC 8th Edition - Pathologic stage from 01/14/2019: No Stage Recommended (ypT2, pN1a, cM0, G2, ER+, PR-, HER2-) - Signed by [name], MD on 01/14/2019   01/22/2019 Miscellaneous   MammaPrint: High risk luminal type B   03/11/2019 Surgery   Bilateral mastectomies [name]):   Left breast: benign tissue and two negative lymph nodes Right breast: residual IDC 0.5 cm, clear margins, 2 lymph nodes negativeER 95%, PR 0%, HER-2 -1+, Ki-67 10%   04/14/2019 -  Chemotherapy   The patient had DOXOrubicin (ADRIAMYCIN) chemo injection 102 mg, 60 mg/m2 = 102 mg, Intravenous,  Once, 4 of 4 cycles Dose modification: 50 mg/m2 (original dose 60 mg/m2, Cycle 2, Reason: Dose not tolerated) Administration: 102 mg (04/14/2019), 84 mg (04/28/2019), 84 mg (05/12/2019), 84 mg (05/26/2019) palonosetron (ALOXI) injection 0.25 mg, 0.25 mg, Intravenous,  Once, 4 of 4 cycles Administration: 0.25 mg (04/14/2019), 0.25 mg (04/28/2019), 0.25 mg (05/12/2019), 0.25 mg (05/26/2019) pegfilgrastim-jmdb (FULPHILA) injection 6 mg, 6 mg, Subcutaneous,  Once, 4 of 4 cycles Administration: 6 mg (04/16/2019), 6 mg (04/30/2019), 6 mg (05/14/2019), 6 mg (05/28/2019) cyclophosphamide (CYTOXAN) 1,020 mg in sodium chloride 0.9 % 250 mL chemo infusion, 600 mg/m2 = 1,020 mg, Intravenous,  Once, 4 of 4 cycles Dose modification: 500 mg/m2 (original dose 600 mg/m2, Cycle 2, Reason: Dose not tolerated) Administration: 1,020 mg (04/14/2019), 840  mg (04/28/2019), 840 mg (05/12/2019), 840 mg (05/26/2019) PACLitaxel (TAXOL) 138 mg in sodium chloride 0.9 % 250 mL chemo infusion (</= 13m/m2), 80 mg/m2 = 138  mg, Intravenous,  Once, 11 of 12 cycles Dose modification: 65 mg/m2 (original dose 80 mg/m2, Cycle 7, Reason: Dose not tolerated, Comment: neuropathy) Administration: 138 mg (06/09/2019), 108 mg (06/16/2019), 108 mg (06/23/2019), 108 mg (06/30/2019), 108 mg (07/07/2019), 108 mg (07/14/2019), 108 mg (07/21/2019), 108 mg (07/28/2019), 108 mg (08/04/2019), 108 mg (08/11/2019), 108 mg (08/18/2019) fosaprepitant (EMEND) 150 mg, dexamethasone (DECADRON) 12 mg in sodium chloride 0.9 % 145 mL IVPB, , Intravenous,  Once, 4 of 4 cycles Administration:  (04/14/2019),  (04/28/2019),  (05/12/2019),  (05/26/2019)  for chemotherapy treatment.      CHIEF COMPLIANT: Cycle12Taxol  INTERVAL HISTORY: DRaechelle Sartiis a 74y.o. with above-mentioned history of right breast cancer treated with neoadjuvant letrozole, lumpectomy, and for which Mammaprint testing revealed she was high risk.She underwent bilateral mastectomiesand is currently on adjuvant chemotherapy withweekly Taxol.[place]  ALLERGIES:  is allergic to lisinopril; alendronate [alendronate]; and hydroxyzine.  MEDICATIONS:  Current Outpatient Medications  Medication Sig Dispense Refill  . amLODipine (NORVASC) 10 MG tablet Take 1 tablet (10 mg total) by mouth daily. MUST SCHEDULE AND KEEP APPOINTMENT FOR REFILLS. 90 tablet 0  . CLOBETASOL PROPIONATE E 0.05 % emollient cream APPLY EXTERNALLY TO THE AFFECTED AREA TWICE DAILY (Patient taking differently: Apply 1 application topically 2 (two) times daily as needed (rash). ) 45 g prn  . lidocaine-prilocaine (EMLA) cream Apply to affected area once 30 g 3  . OVER THE COUNTER MEDICATION Take 2 capsules by mouth 2 (two) times daily as needed (Supplement). TTem[place]-Inland   . simvastatin (ZOCOR) 20 MG tablet Take 1 tablet (20 mg total) by mouth at bedtime. MUST SCHEDULE AND KEEP APPOINTMENT AND HAVE LABS DONE FOR REFILLS 90 tablet 0   No current facility-administered  medications for this visit.    PHYSICAL EXAMINATION: ECOG PERFORMANCE STATUS: 1 - Symptomatic but completely ambulatory  There were no vitals filed for this visit. There were no vitals filed for this visit.  LABORATORY DATA:  I have reviewed the data as listed CMP Latest Ref Rng & Units 08/18/2019 08/11/2019 08/04/2019  Glucose 70 - 99 mg/dL 105(H) 91 112(H)  BUN 8 - 23 mg/dL 7(L) 8 6(L)  Creatinine 0.44 - 1.00 mg/dL 0.66 0.62 0.67  Sodium 135 - 145 mmol/L 142 141 143  Potassium 3.5 - 5.1 mmol/L 3.6 3.5 3.4(L)  Chloride 98 - 111 mmol/L 109 108 110  CO2 22 - 32 mmol/L '24 24 25  ' Calcium 8.9 - 10.3 mg/dL 8.9 8.7(L) 9.0  Total Protein 6.5 - 8.1 g/dL 6.5 6.4(L) 6.6  Total Bilirubin 0.3 - 1.2 mg/dL 0.6 0.6 0.6  Alkaline Phos 38 - 126 U/L 65 64 60  AST 15 - 41 U/L 13(L) 13(L) 15  ALT 0 - 44 U/L '14 13 15    ' Lab Results  Component Value Date   WBC 4.9 08/18/2019   HGB 12.1 08/18/2019   HCT 37.3 08/18/2019   MCV 99.7 08/18/2019   PLT 264 08/18/2019   NEUTROABS 3.2 08/18/2019    ASSESSMENT & PLAN:  Malignant neoplasm of upper-outer quadrant of right breast in female, estrogen receptor positive ([place] 04/23/2018 palpable lump in right breast UOQ, mammogram revealed 2 separate masses. Larger at 12 o'clock position 5.3 cm and smaller at 10 o'clock position 2 cm, biopsy revealed invasive  lobular carcinoma grade 2-3 with LCIS intermediate grade, ER 95%, PR 0%, Ki-67 10%, HER-2 negative IHC 1+ T3 N0 stage 3A  MRI breast 05/19/2018: 5.7 x 4.9 x 3.3 cm mass central right breast, no lymph nodes 05/22/2018: CT CAP: Right renal lesion 06/06/2017: MRI abdomen: Complex cystic lesion right kidney, probably benign but requires follow-up in 6 months  Right lumpectomy: Residual invasive ductal carcinoma 3.8 cm, margins negative, 2/3 lymph nodes positive, ER 95%, PR 0%, HER-2 negative, Ki-67 10%  Treatment plan: 1.Neoadjuvant antiestrogen therapy12/02/2018-July 2020 3.12/24/2018 Right lumpectomy:  Residual invasive ductal carcinoma 3.8 cm, margins negative, 2/3 lymph nodes positive, ER 95%, PR 0%, HER-2 negative, Ki-67 10%  03/11/2019 bilateral mastectomies [name]):  Left breast: benign tissue and two negative lymph nodes Right breast: residual IDC0.5 cm , clear margins, 2 lymph nodes negative, ER 95%, PR 0%, HER-2 -1+, Ki-67 10%  4.MammaPrint high risk: Adjuvant chemotherapy is being recommended with dose dense Adriamycin and Cytoxan followed by Taxol 5.adjuvant radiation therapy 5.Followed by adjuvant antiestrogen therapy ----------------------------------------------------------------------------------------------------------------------------------------------- clinical trialSWOG 4814 ---------------------------------------------------------------------------------------------------------------------------------- Current Treatment:Completed 4 cycles ofdose dense Adriamycin and Cytoxan, todayiscycle12Taxol Labs reviewed Echocardiogram9/03/2019: EF 55 to 60%  Chemo toxicities: 1.Headaches:No further issues 2.Constipation:Resolved 3.Fatigue after chemotherapy:Monitoring 4.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 Return to clinic after radiation is complete.    No orders of the defined types were placed in this encounter.  The patient has a good understanding of the overall plan. she agrees with it. she will call with any problems that may develop before the next visit here.  Total time spent: 30 mins including face to face time and time spent for planning, charting and coordination of care  [name], MD 08/25/2019  I, Cloyde Reams Dorshimer, am acting as scribe for Dr. [name].  I have reviewed the above documentation for accuracy and completeness, and I agree with the above.

## 2019-08-25 ENCOUNTER — Encounter: Payer: Self-pay | Admitting: *Deleted

## 2019-08-25 ENCOUNTER — Other Ambulatory Visit: Payer: Self-pay

## 2019-08-25 ENCOUNTER — Inpatient Hospital Stay: Payer: Medicare Other

## 2019-08-25 ENCOUNTER — Inpatient Hospital Stay (HOSPITAL_BASED_OUTPATIENT_CLINIC_OR_DEPARTMENT_OTHER): Payer: Medicare Other | Admitting: Hematology and Oncology

## 2019-08-25 DIAGNOSIS — Z5111 Encounter for antineoplastic chemotherapy: Secondary | ICD-10-CM | POA: Diagnosis not present

## 2019-08-25 DIAGNOSIS — Z95828 Presence of other vascular implants and grafts: Secondary | ICD-10-CM

## 2019-08-25 DIAGNOSIS — Z7952 Long term (current) use of systemic steroids: Secondary | ICD-10-CM | POA: Diagnosis not present

## 2019-08-25 DIAGNOSIS — C50411 Malignant neoplasm of upper-outer quadrant of right female breast: Secondary | ICD-10-CM

## 2019-08-25 DIAGNOSIS — Z170 Estrogen receptor positive status [ER+]: Secondary | ICD-10-CM

## 2019-08-25 DIAGNOSIS — T451X5A Adverse effect of antineoplastic and immunosuppressive drugs, initial encounter: Secondary | ICD-10-CM | POA: Diagnosis not present

## 2019-08-25 DIAGNOSIS — C50011 Malignant neoplasm of nipple and areola, right female breast: Secondary | ICD-10-CM

## 2019-08-25 DIAGNOSIS — G620 Drug-induced polyneuropathy: Secondary | ICD-10-CM | POA: Diagnosis not present

## 2019-08-25 LAB — CBC WITH DIFFERENTIAL (CANCER CENTER ONLY)
Abs Immature Granulocytes: 0.03 10*3/uL (ref 0.00–0.07)
Basophils Absolute: 0.1 10*3/uL (ref 0.0–0.1)
Basophils Relative: 1 %
Eosinophils Absolute: 0.1 10*3/uL (ref 0.0–0.5)
Eosinophils Relative: 2 %
HCT: 37.7 % (ref 36.0–46.0)
Hemoglobin: 12.4 g/dL (ref 12.0–15.0)
Immature Granulocytes: 1 %
Lymphocytes Relative: 21 %
Lymphs Abs: 1.2 10*3/uL (ref 0.7–4.0)
MCH: 32.3 pg (ref 26.0–34.0)
MCHC: 32.9 g/dL (ref 30.0–36.0)
MCV: 98.2 fL (ref 80.0–100.0)
Monocytes Absolute: 0.5 10*3/uL (ref 0.1–1.0)
Monocytes Relative: 8 %
Neutro Abs: 4.1 10*3/uL (ref 1.7–7.7)
Neutrophils Relative %: 67 %
Platelet Count: 264 10*3/uL (ref 150–400)
RBC: 3.84 MIL/uL — ABNORMAL LOW (ref 3.87–5.11)
RDW: 14 % (ref 11.5–15.5)
WBC Count: 6 10*3/uL (ref 4.0–10.5)
nRBC: 0 % (ref 0.0–0.2)

## 2019-08-25 LAB — CMP (CANCER CENTER ONLY)
ALT: 14 U/L (ref 0–44)
AST: 14 U/L — ABNORMAL LOW (ref 15–41)
Albumin: 3.7 g/dL (ref 3.5–5.0)
Alkaline Phosphatase: 67 U/L (ref 38–126)
Anion gap: 9 (ref 5–15)
BUN: 9 mg/dL (ref 8–23)
CO2: 26 mmol/L (ref 22–32)
Calcium: 8.9 mg/dL (ref 8.9–10.3)
Chloride: 108 mmol/L (ref 98–111)
Creatinine: 0.73 mg/dL (ref 0.44–1.00)
GFR, Est AFR Am: >60 mL/min (ref >60)
GFR, Estimated: >60 mL/min (ref >60)
Glucose, Bld: 106 mg/dL — ABNORMAL HIGH (ref 70–99)
Potassium: 3.4 mmol/L — ABNORMAL LOW (ref 3.5–5.1)
Sodium: 143 mmol/L (ref 135–145)
Total Bilirubin: 0.6 mg/dL (ref 0.3–1.2)
Total Protein: 6.4 g/dL — ABNORMAL LOW (ref 6.5–8.1)

## 2019-08-25 MED ORDER — DIPHENHYDRAMINE HCL 50 MG/ML IJ SOLN
INTRAMUSCULAR | Status: AC
  Filled 2019-08-25: qty 1

## 2019-08-25 MED ORDER — SODIUM CHLORIDE 0.9% FLUSH
10.0000 mL | Freq: Once | INTRAVENOUS | Status: AC
  Administered 2019-08-25: 10 mL
  Filled 2019-08-25: qty 10

## 2019-08-25 MED ORDER — HEPARIN SOD (PORK) LOCK FLUSH 100 UNIT/ML IV SOLN
500.0000 [IU] | Freq: Once | INTRAVENOUS | Status: AC | PRN
  Administered 2019-08-25: 500 [IU]
  Filled 2019-08-25: qty 5

## 2019-08-25 MED ORDER — SODIUM CHLORIDE 0.9 % IV SOLN
65.0000 mg/m2 | Freq: Once | INTRAVENOUS | Status: AC
  Administered 2019-08-25: 108 mg via INTRAVENOUS
  Filled 2019-08-25: qty 18

## 2019-08-25 MED ORDER — SODIUM CHLORIDE 0.9 % IV SOLN
Freq: Once | INTRAVENOUS | Status: AC
  Filled 2019-08-25: qty 250

## 2019-08-25 MED ORDER — FAMOTIDINE IN NACL 20-0.9 MG/50ML-% IV SOLN
20.0000 mg | Freq: Once | INTRAVENOUS | Status: AC
  Administered 2019-08-25: 20 mg via INTRAVENOUS

## 2019-08-25 MED ORDER — DIPHENHYDRAMINE HCL 50 MG/ML IJ SOLN
50.0000 mg | Freq: Once | INTRAMUSCULAR | Status: AC
  Administered 2019-08-25: 50 mg via INTRAVENOUS

## 2019-08-25 MED ORDER — SODIUM CHLORIDE 0.9 % IV SOLN
20.0000 mg | Freq: Once | INTRAVENOUS | Status: AC
  Administered 2019-08-25: 20 mg via INTRAVENOUS
  Filled 2019-08-25: qty 20

## 2019-08-25 MED ORDER — SODIUM CHLORIDE 0.9% FLUSH
10.0000 mL | INTRAVENOUS | Status: DC | PRN
  Administered 2019-08-25: 10 mL
  Filled 2019-08-25: qty 10

## 2019-08-25 MED ORDER — FAMOTIDINE IN NACL 20-0.9 MG/50ML-% IV SOLN
INTRAVENOUS | Status: AC
  Filled 2019-08-25: qty 50

## 2019-08-25 NOTE — Patient Instructions (Signed)
[facility] Discharge Instructions for Patients Receiving Chemotherapy  Today you received the following chemotherapy agents taxol  To help prevent nausea and vomiting after your treatment, we encourage you to take your nausea medication as directed   If you develop nausea and vomiting that is not controlled by your nausea medication, call the clinic.   BELOW ARE SYMPTOMS THAT SHOULD BE REPORTED IMMEDIATELY:  *FEVER GREATER THAN 100.5 F  *CHILLS WITH OR WITHOUT FEVER  NAUSEA AND VOMITING THAT IS NOT CONTROLLED WITH YOUR NAUSEA MEDICATION  *UNUSUAL SHORTNESS OF BREATH  *UNUSUAL BRUISING OR BLEEDING  TENDERNESS IN MOUTH AND THROAT WITH OR WITHOUT PRESENCE OF ULCERS  *URINARY PROBLEMS  *BOWEL PROBLEMS  UNUSUAL RASH Items with * indicate a potential emergency and should be followed up as soon as possible.  Feel free to call the clinic should you have any questions or concerns. The clinic phone number is [phone].  Please show the CHEMO ALERT CARD at check-in to the Emergency Department and triage nurse.   

## 2019-08-25 NOTE — Assessment & Plan Note (Signed)
04/23/2018 palpable lump in right breast UOQ, mammogram revealed 2 separate masses. 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, margins negative, 2/3 lymph nodes positive, ER 95%, PR 0%, HER-2 negative, Ki-67 10%  Treatment plan: 1.Neoadjuvant antiestrogen therapy12/02/2018-July 2020 3.12/24/2018 Right lumpectomy: Residual invasive ductal carcinoma 3.8 cm, margins negative, 2/3 lymph nodes positive, ER 95%, PR 0%, HER-2 negative, Ki-67 10%  03/11/2019 bilateral mastectomies [name]):  Left breast: benign tissue and two negative lymph nodes Right breast: residual IDC0.5 cm , clear margins, 2 lymph nodes negative, ER 95%, PR 0%, HER-2 -1+, Ki-67 10%  4.MammaPrint high risk: Adjuvant chemotherapy is being recommended with dose dense Adriamycin and Cytoxan followed by Taxol 5.adjuvant radiation therapy 5.Followed by adjuvant antiestrogen therapy ----------------------------------------------------------------------------------------------------------------------------------------------- clinical trialSWOG 5072 ---------------------------------------------------------------------------------------------------------------------------------- Current Treatment:Completed 4 cycles ofdose dense Adriamycin and Cytoxan, todayiscycle12Taxol Labs reviewed Echocardiogram9/03/2019: EF 55 to 60%  Chemo toxicities: 1.Headaches:No further issues 2.Constipation:Resolved 3.Fatigue after  chemotherapy:Monitoring 4.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 Return to clinic after radiation is complete.

## 2019-09-01 ENCOUNTER — Encounter: Payer: Self-pay | Admitting: *Deleted

## 2019-09-02 ENCOUNTER — Telehealth: Payer: Self-pay | Admitting: *Deleted

## 2019-09-02 NOTE — Telephone Encounter (Signed)
Received call from pt requesting when she should start Letrozole.  Per MD pt to begin anti estrogen therapy once radiation is complete.  Pt also requesting if she can resume sexual intercourse with her spouse.  RN educated pt on safe sexual intercourse with her spouse.  Pt encouraged to call the office if she starts to develop vaginal irritation or increase in dryness.  Pt verbalized understanding and appreciative of the adivice.

## 2019-09-07 NOTE — Progress Notes (Signed)
Location of Breast Cancer: Malignant neoplasm of upper-outer quadrant of RIGHT breast   Histology per Pathology Report:  12/24/2018 Diagnosis 1. Lymph node, sentinel, biopsy, right #1 - METASTATIC CARCINOMA IN ONE LYMPH NODE (1/1). 2. Lymph node, sentinel, biopsy, right - METASTATIC CARCINOMA IN ONE LYMPH NODE (1/1). 3. Lymph node, sentinel, biopsy, right #2 - ONE LYMPH NODE, NEGATIVE FOR CARCINOMA (0/1). 4. Breast, lumpectomy, right with 2 radioactive seeds - RESIDUAL INVASIVE CARCINOMA STATUS POST NEOADJUVANT TREATMENT. - MARGINS OF RESECTION ARE NOT INVOLVED (CLOSEST MARGINS: LESS THAN 1 MM; ANTERIOR, POSTERIOR, SUPERIOR, INFERIOR AND MEDIAL). - BIOPSY SITE CHANGES. - SEE ONCOLOGY TABLE   03/10/2020 DIAGNOSIS:  A. BREAST, LEFT, SIMPLE MASTECTOMY:  - Benign breast tissue.  - Two lymph nodes, negative for carcinoma (0/2).  B. BREAST, RIGHT, SIMPLE MASTECTOMY:  - Residual invasive carcinoma status post neoadjuvant treatment.  - Margins of resection are not involved (all margins are greater than 10  mm).  - Prior procedure site changes.  - One lymph node, negative for carcinoma (0/1).  - See oncology table.  C. LYMPH NODE, RIGHT AXILLARY, BIOPSY:  - One lymph node, negative for carcinoma (0/1).   ONCOLOGY TABLE:  INVASIVE CARCINOMA OF THE BREAST: STATUS POST NEOADJUVANT TREATMENT:  Resection  Procedure: Simple mastectomy  Specimen Laterality: Right  Tumor Size: 0.5 cm  Histologic Type: Invasive lobular carcinoma  Histologic Grade:    Glandular (Acinar)/Tubular Differentiation: 3    Nuclear Pleomorphism: 2    Mitotic Rate: 1    Overall Grade: 1  Ductal Carcinoma In Situ: Not identified  Tumor Extension: Limited to breast parenchyma  Margins: Uninvolved by invasive carcinoma    Distance from closest margin: All margins are greater than 10 mm  Regional Lymph Nodes:    Number of Lymph Nodes Examined: 2    Number of Sentinel Nodes Examined: 0     Number of Lymph Nodes with Macrometastases: 0    Number of Lymph Nodes with Micrometastases: 0    Number of Lymph Nodes with Isolated Tumor Cells: 0    Size of Largest Metastatic Deposit: Not applicable    Extranodal Extension: Not applicable  Treatment Effect:    In the Breast: Probable or definite response to presurgical therapy  in the invasive carcinoma    In the Lymph Nodes: No lymph node metastases and no prominent  fibrous scarring in the nodes  Breast Prognostic Profile (pre-neoadjuvant case #: [id number])   Estrogen Receptor: 95%, positive, strong    Progesterone Receptor: 0%, negative    Her2: Negative (1+)    Ki-67: 10%    Will be repeated on the current case (Block #: B6) and the results  reported separately  Residual Cancer Burden (RCB):    Primary Tumor Bed: 5 mm x 2 mm    Overall Cancer Cellularity: 1%    Percentage of Cancer that is In Situ: 0%    Number of Positive Lymph Nodes: 0    Diameter of Largest Lymph Node Metastasis: 0 mm    Residual Cancer Burden: 0.778    Residual Cancer Burden Class: RCB-I  Representative Tumor Block: B6  Pathologic Stage Classification (pTNM, AJCC 8th Edition): ypT1a, ypN0  (v4.4.0.0) .   Receptor Status: ER(95%), PR (0%), Her2-neu (negative), Ki-67(10%)  Did patient present with symptoms (if so, please note symptoms) or was this found on screening mammography?:  04/23/2018  Initial Diagnosis: Palpable lump in right breast UOQ, mammogram revealed 2 separate masses.  Larger at 12 o'clock position 5.3  cm and smaller at 10 o'clock position 2 cm  Past/Anticipated interventions by surgeon, if any:  12/24/2018 Dr. [name] RIGHT BREAST LUMPECTOMY WITH RADIOACTIVE SEED LOCALIZATION AND DEEP RIGHT AXILLARY SENTINEL LYMPH NODE BIOPSY  03/11/2019: Dr. [name]  BILATERAL  MASTECTOMIES   Past/Anticipated interventions by medical oncology, if any:  Under care of Dr.  [name] 1.Neoadjuvant antiestrogen therapy12/02/2018-July 2020 3.12/24/2018 Right lumpectomy: Residual invasive ductal carcinoma 3.8 cm, margins negative, 2/3 lymph nodes positive, ER 95%, PR 0%, HER-2 negative, Ki-67 10%  03/11/2019 bilateral mastectomies Samantha Cross):  Left breast: benign tissue and two negative lymph nodes Right breast: residual IDC0.5 cm , clear margins, 2 lymph nodes negative, ER 95%, PR 0%, HER-2 -1+, Ki-67 10% 4.MammaPrint high risk: Adjuvant chemotherapy is being recommended with dose dense Adriamycin and Cytoxan (completed 05/26/2019) followed by weekly Taxol(conpleted 08/25/2019) 5.adjuvant radiation therapy 6.Followed by adjuvant antiestrogen therapy (declined to participate in [employer] and SWOG 1714 clinical trials)  Lymphedema issues, if any:  None   Pain issues, if any:  Occasionally a sharp pain in the left underarm but it resolves on its own  SAFETY ISSUES:  Prior radiation? No  Pacemaker/ICD? No  Possible current pregnancy? No  Is the patient on methotrexate? No  Current Complaints / other details:   Nothing of note.      [name], RN 09/07/2019,2:18 PM

## 2019-09-09 ENCOUNTER — Telehealth: Payer: Self-pay

## 2019-09-09 NOTE — Telephone Encounter (Signed)
Patient left VM inquiring if Dr. Isidore Moos would be able to "3-way call" patient's husband during her radiation consult visit next week.   Returned patient call to let her know that Dr. Isidore Moos would be more that willing to conference husband in during appointment. Also informed patient that if husband was free, he would be allowed to attend provider visit in person (as long as he's feeling well and has not been recently exposed to anyone who tested positive for COVID-19). Before hanging up, patient mentioned she received her 2nd COVID-19 vaccine the other day and has been "feeling terribly" since (body aches and general malaise). Encouraged patient to rest, drink plenty of noncaffinated fluids, and take OTC pain reliever for body aches. Advised patient to call clinic next week if she is still feeling poorly before her consult/simulation for radiation treatment. Patient verbalized understanding and agreement. No other needs identified at this time.

## 2019-09-14 ENCOUNTER — Ambulatory Visit: Payer: Self-pay | Admitting: General Surgery

## 2019-09-14 NOTE — Progress Notes (Signed)
Radiation Oncology         [phone] ________________________________  Name: Samantha Cross MRN: [id number]  Date: 09/15/2019  DOB: May 08, 1946  Follow-Up New Visit Note - In Person Outpatient  CC: [name], MD  [name], MD  Diagnosis:      ICD-10-CM   1. Malignant neoplasm of upper-outer quadrant of right breast in female, estrogen receptor positive ([place])  C50.411    Z17.0      Cancer Staging Malignant neoplasm of upper-outer quadrant of right breast in female, estrogen receptor positive ([place]) Staging form: Breast, AJCC 8th Edition - Clinical: Stage IIIA (cT3(m), cN0, cM0, G3, ER+, PR-, HER2-) - Signed by [name], MD on 05/12/2018 - Pathologic stage from 01/14/2019: No Stage Recommended (ypT2, pN1a, cM0, G2, ER+, PR-, HER2-) - Signed by [name], MD on 01/14/2019   CHIEF COMPLAINT: Here to discuss management of right breast cancer  Narrative:  The patient returns today for further evaluation of her right breast cancer.  I last saw her via WebEx in 01/2019.  In review, in December 2019 an MRI of her breast showed a 5.7 cm mass in the central right breast.  Lymph nodes appeared normal.  Bone scan and CT staging was negative for metastases.  She underwent neoadjuvant letrozole.  She underwent genetic testing which showed SDHA c.955A>C VUS but otherwise negative testing  Right breast lumpectomy with sentinel lymph node biopsy on date of 12/24/2018 revealed: Residual invasive lobular carcinoma, measuring 3.8 cm, status post neoadjuvant treatment. Margins of resection were not involved (closest margins: less than 1 mm; anterior, posterior, superior, inferior, and medial). Metastatic carcinoma in 2/3 right lymph nodes.  ER status: 100%, PR status: negative, Her2 status: negative.  Mammaprint was performed on the final surgical sample and showed the patient to be high risk. In light of this, she opted to proceed with bilateral mastectomies. Performed  on 03/11/2019, pathology revealed: residual invasive lobular carcinoma, 0.5 cm; clear margins; ER+, PR-, Her2-; both lymph nodes negative for carcinoma (0/2). Left breast was benign, also with negative lymph nodes (0/2).  She was treated with additional chemoptherapy consisting of 4 cycles of Adriamycin and Cytoxan followed by 12 cycles of Taxol.  Symptomatically, the patient denies any lymphedema.  She occasionally has a sharp pain in the left underarm but this resolves on its own.  She denies prior radiotherapy.  She is doing relatively well.        ALLERGIES:  is allergic to lisinopril; alendronate [alendronate]; and hydroxyzine.  Meds: Current Outpatient Medications  Medication Sig Dispense Refill  . amLODipine (NORVASC) 10 MG tablet Take 1 tablet (10 mg total) by mouth daily. MUST SCHEDULE AND KEEP APPOINTMENT FOR REFILLS. 90 tablet 0  . CLOBETASOL PROPIONATE E 0.05 % emollient cream APPLY EXTERNALLY TO THE AFFECTED AREA TWICE DAILY (Patient taking differently: Apply 1 application topically 2 (two) times daily as needed (rash). ) 45 g prn  . lidocaine-prilocaine (EMLA) cream Apply to affected area once 30 g 3  . OVER THE COUNTER MEDICATION Take 2 capsules by mouth 2 (two) times daily as needed (Supplement). [employer]    . simvastatin (ZOCOR) 20 MG tablet Take 1 tablet (20 mg total) by mouth at bedtime. MUST SCHEDULE AND KEEP APPOINTMENT AND HAVE LABS DONE FOR REFILLS 90 tablet 0   No current facility-administered medications for this encounter.    Physical Findings:  height is '5\' 3"'  (1.6 m) and weight is 137 lb 12.8 oz (62.5 kg). Her  temperature is 98.5 F (36.9 C). Her blood pressure is 125/89 and her pulse is 76. Her respiration is 20 and oxygen saturation is 100%. .     General: Alert and oriented, in no acute distress  Psychiatric: Judgment and insight are intact. Affect is appropriate. Breasts: Status post bilateral mastectomy.  Scars have healed well.  Lab Findings: Lab  Results  Component Value Date   WBC 6.0 08/25/2019   HGB 12.4 08/25/2019   HCT 37.7 08/25/2019   MCV 98.2 08/25/2019   PLT 264 08/25/2019      Radiographic Findings: As above.  I personally reviewed her images.  Impression/Plan: Right Breast Cancer (s/p bilateral mastectomies)  She is now done with surgery and chemotherapy.  We again discussed adjuvant radiotherapy today.  I recommend radiotherapy to the right chest wall and regional nodes in order to reduce risk of local regional recurrence by two thirds.  The risks, benefits and side effects of this treatment were discussed again in detail.  She understands that radiotherapy is associated with skin irritation and fatigue in the acute setting. Late effects can include cosmetic changes and rare injury to internal organs.   She is enthusiastic about proceeding with treatment.  We will proceed with CT simulation today.  Consent form has been signed.  She is pleased with this plan.  She understands that she will receive approximately 6 weeks of radiotherapy.    On date of service, in total, I spent 30 minutes on this encounter.  Patient was seen in person.   _____________________________________   [name], MD  This document serves as a record of services personally performed by [name], MD. It was created on her behalf by Wilburn Mylar, a trained medical scribe. The creation of this record is based on the scribe's personal observations and the provider's statements to them. This document has been checked and approved by the attending provider.

## 2019-09-15 ENCOUNTER — Ambulatory Visit
Admission: RE | Admit: 2019-09-15 | Discharge: 2019-09-15 | Disposition: A | Discharge status: Home / Self Care | Payer: Medicare Other | Source: Ambulatory Visit | Attending: Radiation Oncology | Admitting: Radiation Oncology

## 2019-09-15 ENCOUNTER — Other Ambulatory Visit: Payer: Self-pay

## 2019-09-15 VITALS — BP 125/89 | HR 76 | Temp 98.5°F | Resp 20 | Ht 63.0 in | Wt 137.8 lb

## 2019-09-15 DIAGNOSIS — Z9013 Acquired absence of bilateral breasts and nipples: Secondary | ICD-10-CM | POA: Diagnosis not present

## 2019-09-15 DIAGNOSIS — C50411 Malignant neoplasm of upper-outer quadrant of right female breast: Secondary | ICD-10-CM | POA: Diagnosis not present

## 2019-09-15 DIAGNOSIS — C773 Secondary and unspecified malignant neoplasm of axilla and upper limb lymph nodes: Secondary | ICD-10-CM | POA: Diagnosis not present

## 2019-09-15 DIAGNOSIS — Z170 Estrogen receptor positive status [ER+]: Secondary | ICD-10-CM | POA: Diagnosis not present

## 2019-09-15 DIAGNOSIS — Z9221 Personal history of antineoplastic chemotherapy: Secondary | ICD-10-CM | POA: Diagnosis not present

## 2019-09-15 DIAGNOSIS — Z510 Encounter for antineoplastic radiation therapy: Secondary | ICD-10-CM | POA: Insufficient documentation

## 2019-09-18 ENCOUNTER — Encounter: Payer: Self-pay | Admitting: Radiation Oncology

## 2019-09-18 DIAGNOSIS — C50411 Malignant neoplasm of upper-outer quadrant of right female breast: Secondary | ICD-10-CM | POA: Diagnosis not present

## 2019-09-18 DIAGNOSIS — Z170 Estrogen receptor positive status [ER+]: Secondary | ICD-10-CM | POA: Diagnosis not present

## 2019-09-18 DIAGNOSIS — Z510 Encounter for antineoplastic radiation therapy: Secondary | ICD-10-CM | POA: Diagnosis not present

## 2019-09-21 ENCOUNTER — Other Ambulatory Visit: Payer: Self-pay

## 2019-09-21 ENCOUNTER — Ambulatory Visit
Admission: RE | Admit: 2019-09-21 | Discharge: 2019-09-21 | Disposition: A | Discharge status: Home / Self Care | Payer: Medicare Other | Source: Ambulatory Visit | Attending: Radiation Oncology | Admitting: Radiation Oncology

## 2019-09-21 ENCOUNTER — Encounter: Payer: Self-pay | Admitting: *Deleted

## 2019-09-21 DIAGNOSIS — Z170 Estrogen receptor positive status [ER+]: Secondary | ICD-10-CM | POA: Diagnosis not present

## 2019-09-21 DIAGNOSIS — Z510 Encounter for antineoplastic radiation therapy: Secondary | ICD-10-CM | POA: Diagnosis not present

## 2019-09-21 DIAGNOSIS — C50411 Malignant neoplasm of upper-outer quadrant of right female breast: Secondary | ICD-10-CM

## 2019-09-21 MED ORDER — SONAFINE EX EMUL
1.0000 "application " | Freq: Two times a day (BID) | CUTANEOUS | Status: DC
  Administered 2019-09-21: 1 via TOPICAL

## 2019-09-21 MED ORDER — ALRA NON-METALLIC DEODORANT (RAD-ONC)
1.0000 "application " | Freq: Once | TOPICAL | Status: AC
  Administered 2019-09-21: 1 via TOPICAL

## 2019-09-21 NOTE — Progress Notes (Signed)

## 2019-09-22 ENCOUNTER — Other Ambulatory Visit: Payer: Self-pay

## 2019-09-22 ENCOUNTER — Ambulatory Visit
Admission: RE | Admit: 2019-09-22 | Discharge: 2019-09-22 | Disposition: A | Discharge status: Home / Self Care | Payer: Medicare Other | Source: Ambulatory Visit | Attending: Radiation Oncology | Admitting: Radiation Oncology

## 2019-09-22 DIAGNOSIS — Z510 Encounter for antineoplastic radiation therapy: Secondary | ICD-10-CM | POA: Diagnosis not present

## 2019-09-22 DIAGNOSIS — Z170 Estrogen receptor positive status [ER+]: Secondary | ICD-10-CM | POA: Diagnosis not present

## 2019-09-22 DIAGNOSIS — C50411 Malignant neoplasm of upper-outer quadrant of right female breast: Secondary | ICD-10-CM | POA: Diagnosis not present

## 2019-09-23 ENCOUNTER — Other Ambulatory Visit: Payer: Self-pay

## 2019-09-23 ENCOUNTER — Ambulatory Visit
Admission: RE | Admit: 2019-09-23 | Discharge: 2019-09-23 | Disposition: A | Discharge status: Home / Self Care | Payer: Medicare Other | Source: Ambulatory Visit | Attending: Radiation Oncology | Admitting: Radiation Oncology

## 2019-09-23 DIAGNOSIS — C50411 Malignant neoplasm of upper-outer quadrant of right female breast: Secondary | ICD-10-CM | POA: Diagnosis not present

## 2019-09-23 DIAGNOSIS — Z510 Encounter for antineoplastic radiation therapy: Secondary | ICD-10-CM | POA: Diagnosis not present

## 2019-09-23 DIAGNOSIS — Z170 Estrogen receptor positive status [ER+]: Secondary | ICD-10-CM | POA: Diagnosis not present

## 2019-09-24 ENCOUNTER — Other Ambulatory Visit: Payer: Self-pay

## 2019-09-24 ENCOUNTER — Ambulatory Visit
Admission: RE | Admit: 2019-09-24 | Discharge: 2019-09-24 | Disposition: A | Discharge status: Home / Self Care | Payer: Medicare Other | Source: Ambulatory Visit | Attending: Radiation Oncology | Admitting: Radiation Oncology

## 2019-09-24 DIAGNOSIS — C50411 Malignant neoplasm of upper-outer quadrant of right female breast: Secondary | ICD-10-CM | POA: Diagnosis not present

## 2019-09-24 DIAGNOSIS — Z170 Estrogen receptor positive status [ER+]: Secondary | ICD-10-CM | POA: Diagnosis not present

## 2019-09-24 DIAGNOSIS — Z510 Encounter for antineoplastic radiation therapy: Secondary | ICD-10-CM | POA: Diagnosis not present

## 2019-09-25 ENCOUNTER — Ambulatory Visit
Admission: RE | Admit: 2019-09-25 | Discharge: 2019-09-25 | Disposition: A | Discharge status: Home / Self Care | Payer: Medicare Other | Source: Ambulatory Visit | Attending: Radiation Oncology | Admitting: Radiation Oncology

## 2019-09-25 ENCOUNTER — Other Ambulatory Visit: Payer: Self-pay

## 2019-09-25 DIAGNOSIS — Z170 Estrogen receptor positive status [ER+]: Secondary | ICD-10-CM | POA: Diagnosis not present

## 2019-09-25 DIAGNOSIS — C50411 Malignant neoplasm of upper-outer quadrant of right female breast: Secondary | ICD-10-CM | POA: Diagnosis not present

## 2019-09-25 DIAGNOSIS — Z510 Encounter for antineoplastic radiation therapy: Secondary | ICD-10-CM | POA: Diagnosis not present

## 2019-09-28 ENCOUNTER — Ambulatory Visit
Admission: RE | Admit: 2019-09-28 | Discharge: 2019-09-28 | Disposition: A | Discharge status: Home / Self Care | Payer: Medicare Other | Source: Ambulatory Visit | Attending: Radiation Oncology | Admitting: Radiation Oncology

## 2019-09-28 ENCOUNTER — Other Ambulatory Visit: Payer: Self-pay

## 2019-09-28 DIAGNOSIS — Z170 Estrogen receptor positive status [ER+]: Secondary | ICD-10-CM | POA: Diagnosis not present

## 2019-09-28 DIAGNOSIS — Z510 Encounter for antineoplastic radiation therapy: Secondary | ICD-10-CM | POA: Diagnosis not present

## 2019-09-28 DIAGNOSIS — C50411 Malignant neoplasm of upper-outer quadrant of right female breast: Secondary | ICD-10-CM | POA: Diagnosis not present

## 2019-09-29 ENCOUNTER — Encounter: Payer: Self-pay | Admitting: *Deleted

## 2019-09-29 ENCOUNTER — Ambulatory Visit
Admission: RE | Admit: 2019-09-29 | Discharge: 2019-09-29 | Disposition: A | Discharge status: Home / Self Care | Payer: Medicare Other | Source: Ambulatory Visit | Attending: Radiation Oncology | Admitting: Radiation Oncology

## 2019-09-29 ENCOUNTER — Other Ambulatory Visit: Payer: Self-pay

## 2019-09-29 DIAGNOSIS — C50411 Malignant neoplasm of upper-outer quadrant of right female breast: Secondary | ICD-10-CM | POA: Diagnosis not present

## 2019-09-29 DIAGNOSIS — Z510 Encounter for antineoplastic radiation therapy: Secondary | ICD-10-CM | POA: Diagnosis not present

## 2019-09-29 DIAGNOSIS — Z170 Estrogen receptor positive status [ER+]: Secondary | ICD-10-CM | POA: Diagnosis not present

## 2019-09-30 ENCOUNTER — Ambulatory Visit
Admission: RE | Admit: 2019-09-30 | Discharge: 2019-09-30 | Disposition: A | Discharge status: Home / Self Care | Payer: Medicare Other | Source: Ambulatory Visit | Attending: Radiation Oncology | Admitting: Radiation Oncology

## 2019-09-30 ENCOUNTER — Other Ambulatory Visit: Payer: Self-pay

## 2019-09-30 DIAGNOSIS — Z510 Encounter for antineoplastic radiation therapy: Secondary | ICD-10-CM | POA: Diagnosis not present

## 2019-09-30 DIAGNOSIS — C50411 Malignant neoplasm of upper-outer quadrant of right female breast: Secondary | ICD-10-CM | POA: Diagnosis not present

## 2019-09-30 DIAGNOSIS — Z170 Estrogen receptor positive status [ER+]: Secondary | ICD-10-CM | POA: Diagnosis not present

## 2019-10-01 ENCOUNTER — Ambulatory Visit
Admission: RE | Admit: 2019-10-01 | Discharge: 2019-10-01 | Disposition: A | Discharge status: Home / Self Care | Payer: Medicare Other | Source: Ambulatory Visit | Attending: Radiation Oncology | Admitting: Radiation Oncology

## 2019-10-01 ENCOUNTER — Other Ambulatory Visit: Payer: Self-pay

## 2019-10-01 DIAGNOSIS — Z170 Estrogen receptor positive status [ER+]: Secondary | ICD-10-CM | POA: Diagnosis not present

## 2019-10-01 DIAGNOSIS — C50411 Malignant neoplasm of upper-outer quadrant of right female breast: Secondary | ICD-10-CM | POA: Diagnosis not present

## 2019-10-01 DIAGNOSIS — Z510 Encounter for antineoplastic radiation therapy: Secondary | ICD-10-CM | POA: Diagnosis not present

## 2019-10-02 ENCOUNTER — Ambulatory Visit
Admission: RE | Admit: 2019-10-02 | Discharge: 2019-10-02 | Disposition: A | Discharge status: Home / Self Care | Payer: Medicare Other | Source: Ambulatory Visit | Attending: Radiation Oncology | Admitting: Radiation Oncology

## 2019-10-02 ENCOUNTER — Other Ambulatory Visit: Payer: Self-pay

## 2019-10-02 DIAGNOSIS — C50411 Malignant neoplasm of upper-outer quadrant of right female breast: Secondary | ICD-10-CM | POA: Diagnosis not present

## 2019-10-02 DIAGNOSIS — Z510 Encounter for antineoplastic radiation therapy: Secondary | ICD-10-CM | POA: Diagnosis not present

## 2019-10-02 DIAGNOSIS — Z170 Estrogen receptor positive status [ER+]: Secondary | ICD-10-CM | POA: Diagnosis not present

## 2019-10-05 ENCOUNTER — Other Ambulatory Visit: Payer: Self-pay

## 2019-10-05 ENCOUNTER — Ambulatory Visit
Admission: RE | Admit: 2019-10-05 | Discharge: 2019-10-05 | Disposition: A | Discharge status: Home / Self Care | Payer: Medicare Other | Source: Ambulatory Visit | Attending: Radiation Oncology | Admitting: Radiation Oncology

## 2019-10-05 DIAGNOSIS — C50411 Malignant neoplasm of upper-outer quadrant of right female breast: Secondary | ICD-10-CM | POA: Insufficient documentation

## 2019-10-05 DIAGNOSIS — Z510 Encounter for antineoplastic radiation therapy: Secondary | ICD-10-CM | POA: Insufficient documentation

## 2019-10-05 DIAGNOSIS — Z170 Estrogen receptor positive status [ER+]: Secondary | ICD-10-CM | POA: Diagnosis not present

## 2019-10-06 ENCOUNTER — Other Ambulatory Visit: Payer: Self-pay

## 2019-10-06 ENCOUNTER — Ambulatory Visit
Admission: RE | Admit: 2019-10-06 | Discharge: 2019-10-06 | Disposition: A | Discharge status: Home / Self Care | Payer: Medicare Other | Source: Ambulatory Visit | Attending: Radiation Oncology | Admitting: Radiation Oncology

## 2019-10-06 DIAGNOSIS — C50411 Malignant neoplasm of upper-outer quadrant of right female breast: Secondary | ICD-10-CM | POA: Diagnosis not present

## 2019-10-06 DIAGNOSIS — Z170 Estrogen receptor positive status [ER+]: Secondary | ICD-10-CM | POA: Diagnosis not present

## 2019-10-06 DIAGNOSIS — Z510 Encounter for antineoplastic radiation therapy: Secondary | ICD-10-CM | POA: Diagnosis not present

## 2019-10-07 ENCOUNTER — Other Ambulatory Visit: Payer: Self-pay

## 2019-10-07 ENCOUNTER — Telehealth: Payer: Self-pay | Admitting: Hematology and Oncology

## 2019-10-07 ENCOUNTER — Encounter: Payer: Self-pay | Admitting: *Deleted

## 2019-10-07 ENCOUNTER — Ambulatory Visit
Admission: RE | Admit: 2019-10-07 | Discharge: 2019-10-07 | Disposition: A | Discharge status: Home / Self Care | Payer: Medicare Other | Source: Ambulatory Visit | Attending: Radiation Oncology | Admitting: Radiation Oncology

## 2019-10-07 DIAGNOSIS — Z170 Estrogen receptor positive status [ER+]: Secondary | ICD-10-CM | POA: Diagnosis not present

## 2019-10-07 DIAGNOSIS — C50411 Malignant neoplasm of upper-outer quadrant of right female breast: Secondary | ICD-10-CM | POA: Diagnosis not present

## 2019-10-07 DIAGNOSIS — Z510 Encounter for antineoplastic radiation therapy: Secondary | ICD-10-CM | POA: Diagnosis not present

## 2019-10-07 NOTE — Telephone Encounter (Signed)
Spoke with pt's husband. Scheduled appt on 5/26. Per 5/5 sch msg.

## 2019-10-08 ENCOUNTER — Other Ambulatory Visit: Payer: Self-pay

## 2019-10-08 ENCOUNTER — Ambulatory Visit
Admission: RE | Admit: 2019-10-08 | Discharge: 2019-10-08 | Disposition: A | Discharge status: Home / Self Care | Payer: Medicare Other | Source: Ambulatory Visit | Attending: Radiation Oncology | Admitting: Radiation Oncology

## 2019-10-08 DIAGNOSIS — C50411 Malignant neoplasm of upper-outer quadrant of right female breast: Secondary | ICD-10-CM | POA: Diagnosis not present

## 2019-10-08 DIAGNOSIS — Z170 Estrogen receptor positive status [ER+]: Secondary | ICD-10-CM | POA: Diagnosis not present

## 2019-10-08 DIAGNOSIS — Z510 Encounter for antineoplastic radiation therapy: Secondary | ICD-10-CM | POA: Diagnosis not present

## 2019-10-09 ENCOUNTER — Ambulatory Visit
Admission: RE | Admit: 2019-10-09 | Discharge: 2019-10-09 | Disposition: A | Discharge status: Home / Self Care | Payer: Medicare Other | Source: Ambulatory Visit | Attending: Radiation Oncology | Admitting: Radiation Oncology

## 2019-10-09 ENCOUNTER — Other Ambulatory Visit: Payer: Self-pay

## 2019-10-09 DIAGNOSIS — Z170 Estrogen receptor positive status [ER+]: Secondary | ICD-10-CM | POA: Diagnosis not present

## 2019-10-09 DIAGNOSIS — Z510 Encounter for antineoplastic radiation therapy: Secondary | ICD-10-CM | POA: Diagnosis not present

## 2019-10-09 DIAGNOSIS — C50411 Malignant neoplasm of upper-outer quadrant of right female breast: Secondary | ICD-10-CM | POA: Diagnosis not present

## 2019-10-12 ENCOUNTER — Ambulatory Visit
Admission: RE | Admit: 2019-10-12 | Discharge: 2019-10-12 | Disposition: A | Discharge status: Home / Self Care | Payer: Medicare Other | Source: Ambulatory Visit | Attending: Radiation Oncology | Admitting: Radiation Oncology

## 2019-10-12 ENCOUNTER — Encounter: Payer: Self-pay | Admitting: *Deleted

## 2019-10-12 ENCOUNTER — Other Ambulatory Visit: Payer: Self-pay

## 2019-10-12 DIAGNOSIS — Z510 Encounter for antineoplastic radiation therapy: Secondary | ICD-10-CM | POA: Diagnosis not present

## 2019-10-12 DIAGNOSIS — Z170 Estrogen receptor positive status [ER+]: Secondary | ICD-10-CM | POA: Diagnosis not present

## 2019-10-12 DIAGNOSIS — C50411 Malignant neoplasm of upper-outer quadrant of right female breast: Secondary | ICD-10-CM | POA: Diagnosis not present

## 2019-10-13 ENCOUNTER — Ambulatory Visit
Admission: RE | Admit: 2019-10-13 | Discharge: 2019-10-13 | Disposition: A | Discharge status: Home / Self Care | Payer: Medicare Other | Source: Ambulatory Visit | Attending: Radiation Oncology | Admitting: Radiation Oncology

## 2019-10-13 ENCOUNTER — Other Ambulatory Visit: Payer: Self-pay

## 2019-10-13 DIAGNOSIS — Z170 Estrogen receptor positive status [ER+]: Secondary | ICD-10-CM | POA: Diagnosis not present

## 2019-10-13 DIAGNOSIS — C50411 Malignant neoplasm of upper-outer quadrant of right female breast: Secondary | ICD-10-CM | POA: Diagnosis not present

## 2019-10-13 DIAGNOSIS — Z510 Encounter for antineoplastic radiation therapy: Secondary | ICD-10-CM | POA: Diagnosis not present

## 2019-10-14 ENCOUNTER — Ambulatory Visit
Admission: RE | Admit: 2019-10-14 | Discharge: 2019-10-14 | Disposition: A | Discharge status: Home / Self Care | Payer: Medicare Other | Source: Ambulatory Visit | Attending: Radiation Oncology | Admitting: Radiation Oncology

## 2019-10-14 ENCOUNTER — Other Ambulatory Visit: Payer: Self-pay

## 2019-10-14 DIAGNOSIS — Z510 Encounter for antineoplastic radiation therapy: Secondary | ICD-10-CM | POA: Diagnosis not present

## 2019-10-14 DIAGNOSIS — Z170 Estrogen receptor positive status [ER+]: Secondary | ICD-10-CM | POA: Diagnosis not present

## 2019-10-14 DIAGNOSIS — C50411 Malignant neoplasm of upper-outer quadrant of right female breast: Secondary | ICD-10-CM | POA: Diagnosis not present

## 2019-10-15 ENCOUNTER — Ambulatory Visit
Admission: RE | Admit: 2019-10-15 | Discharge: 2019-10-15 | Disposition: A | Discharge status: Home / Self Care | Payer: Medicare Other | Source: Ambulatory Visit | Attending: Radiation Oncology | Admitting: Radiation Oncology

## 2019-10-15 ENCOUNTER — Other Ambulatory Visit: Payer: Self-pay

## 2019-10-15 DIAGNOSIS — C50411 Malignant neoplasm of upper-outer quadrant of right female breast: Secondary | ICD-10-CM | POA: Diagnosis not present

## 2019-10-15 DIAGNOSIS — Z170 Estrogen receptor positive status [ER+]: Secondary | ICD-10-CM | POA: Diagnosis not present

## 2019-10-15 DIAGNOSIS — Z510 Encounter for antineoplastic radiation therapy: Secondary | ICD-10-CM | POA: Diagnosis not present

## 2019-10-16 ENCOUNTER — Other Ambulatory Visit: Payer: Self-pay

## 2019-10-16 ENCOUNTER — Ambulatory Visit
Admission: RE | Admit: 2019-10-16 | Discharge: 2019-10-16 | Disposition: A | Discharge status: Home / Self Care | Payer: Medicare Other | Source: Ambulatory Visit | Attending: Radiation Oncology | Admitting: Radiation Oncology

## 2019-10-16 DIAGNOSIS — Z510 Encounter for antineoplastic radiation therapy: Secondary | ICD-10-CM | POA: Diagnosis not present

## 2019-10-16 DIAGNOSIS — Z170 Estrogen receptor positive status [ER+]: Secondary | ICD-10-CM | POA: Diagnosis not present

## 2019-10-16 DIAGNOSIS — C50411 Malignant neoplasm of upper-outer quadrant of right female breast: Secondary | ICD-10-CM | POA: Diagnosis not present

## 2019-10-19 ENCOUNTER — Ambulatory Visit
Admission: RE | Admit: 2019-10-19 | Discharge: 2019-10-19 | Disposition: A | Discharge status: Home / Self Care | Payer: Medicare Other | Source: Ambulatory Visit | Attending: Radiation Oncology | Admitting: Radiation Oncology

## 2019-10-19 ENCOUNTER — Other Ambulatory Visit: Payer: Self-pay

## 2019-10-19 DIAGNOSIS — Z170 Estrogen receptor positive status [ER+]: Secondary | ICD-10-CM | POA: Diagnosis not present

## 2019-10-19 DIAGNOSIS — C50411 Malignant neoplasm of upper-outer quadrant of right female breast: Secondary | ICD-10-CM

## 2019-10-19 DIAGNOSIS — Z510 Encounter for antineoplastic radiation therapy: Secondary | ICD-10-CM | POA: Diagnosis not present

## 2019-10-19 MED ORDER — SONAFINE EX EMUL
1.0000 "application " | Freq: Two times a day (BID) | CUTANEOUS | Status: DC
  Administered 2019-10-19: 1 via TOPICAL

## 2019-10-20 ENCOUNTER — Other Ambulatory Visit: Payer: Self-pay

## 2019-10-20 ENCOUNTER — Ambulatory Visit
Admission: RE | Admit: 2019-10-20 | Discharge: 2019-10-20 | Disposition: A | Discharge status: Home / Self Care | Payer: Medicare Other | Source: Ambulatory Visit | Attending: Radiation Oncology | Admitting: Radiation Oncology

## 2019-10-20 DIAGNOSIS — Z510 Encounter for antineoplastic radiation therapy: Secondary | ICD-10-CM | POA: Diagnosis not present

## 2019-10-20 DIAGNOSIS — C50411 Malignant neoplasm of upper-outer quadrant of right female breast: Secondary | ICD-10-CM | POA: Diagnosis not present

## 2019-10-20 DIAGNOSIS — Z170 Estrogen receptor positive status [ER+]: Secondary | ICD-10-CM | POA: Diagnosis not present

## 2019-10-21 ENCOUNTER — Other Ambulatory Visit: Payer: Self-pay

## 2019-10-21 ENCOUNTER — Ambulatory Visit
Admission: RE | Admit: 2019-10-21 | Discharge: 2019-10-21 | Disposition: A | Discharge status: Home / Self Care | Payer: Medicare Other | Source: Ambulatory Visit | Attending: Radiation Oncology | Admitting: Radiation Oncology

## 2019-10-21 DIAGNOSIS — Z510 Encounter for antineoplastic radiation therapy: Secondary | ICD-10-CM | POA: Diagnosis not present

## 2019-10-21 DIAGNOSIS — Z170 Estrogen receptor positive status [ER+]: Secondary | ICD-10-CM | POA: Diagnosis not present

## 2019-10-21 DIAGNOSIS — C50411 Malignant neoplasm of upper-outer quadrant of right female breast: Secondary | ICD-10-CM | POA: Diagnosis not present

## 2019-10-22 ENCOUNTER — Other Ambulatory Visit: Payer: Self-pay

## 2019-10-22 ENCOUNTER — Ambulatory Visit
Admission: RE | Admit: 2019-10-22 | Discharge: 2019-10-22 | Disposition: A | Discharge status: Home / Self Care | Payer: Medicare Other | Source: Ambulatory Visit | Attending: Radiation Oncology | Admitting: Radiation Oncology

## 2019-10-22 DIAGNOSIS — C50411 Malignant neoplasm of upper-outer quadrant of right female breast: Secondary | ICD-10-CM | POA: Diagnosis not present

## 2019-10-22 DIAGNOSIS — Z170 Estrogen receptor positive status [ER+]: Secondary | ICD-10-CM | POA: Diagnosis not present

## 2019-10-22 DIAGNOSIS — Z510 Encounter for antineoplastic radiation therapy: Secondary | ICD-10-CM | POA: Diagnosis not present

## 2019-10-23 ENCOUNTER — Ambulatory Visit
Admission: RE | Admit: 2019-10-23 | Discharge: 2019-10-23 | Disposition: A | Discharge status: Home / Self Care | Payer: Medicare Other | Source: Ambulatory Visit | Attending: Radiation Oncology | Admitting: Radiation Oncology

## 2019-10-23 ENCOUNTER — Other Ambulatory Visit: Payer: Self-pay

## 2019-10-23 DIAGNOSIS — Z170 Estrogen receptor positive status [ER+]: Secondary | ICD-10-CM | POA: Diagnosis not present

## 2019-10-23 DIAGNOSIS — Z510 Encounter for antineoplastic radiation therapy: Secondary | ICD-10-CM | POA: Diagnosis not present

## 2019-10-23 DIAGNOSIS — C50411 Malignant neoplasm of upper-outer quadrant of right female breast: Secondary | ICD-10-CM | POA: Diagnosis not present

## 2019-10-26 ENCOUNTER — Other Ambulatory Visit: Payer: Self-pay

## 2019-10-26 ENCOUNTER — Ambulatory Visit
Admission: RE | Admit: 2019-10-26 | Discharge: 2019-10-26 | Disposition: A | Discharge status: Home / Self Care | Payer: Medicare Other | Source: Ambulatory Visit | Attending: Radiation Oncology | Admitting: Radiation Oncology

## 2019-10-26 DIAGNOSIS — Z510 Encounter for antineoplastic radiation therapy: Secondary | ICD-10-CM | POA: Diagnosis not present

## 2019-10-26 DIAGNOSIS — C50411 Malignant neoplasm of upper-outer quadrant of right female breast: Secondary | ICD-10-CM | POA: Diagnosis not present

## 2019-10-26 DIAGNOSIS — Z170 Estrogen receptor positive status [ER+]: Secondary | ICD-10-CM | POA: Diagnosis not present

## 2019-10-27 ENCOUNTER — Other Ambulatory Visit: Payer: Self-pay

## 2019-10-27 ENCOUNTER — Other Ambulatory Visit: Payer: Self-pay | Admitting: *Deleted

## 2019-10-27 ENCOUNTER — Ambulatory Visit
Admission: RE | Admit: 2019-10-27 | Discharge: 2019-10-27 | Disposition: A | Discharge status: Home / Self Care | Payer: Medicare Other | Source: Ambulatory Visit | Attending: Radiation Oncology | Admitting: Radiation Oncology

## 2019-10-27 DIAGNOSIS — Z510 Encounter for antineoplastic radiation therapy: Secondary | ICD-10-CM | POA: Diagnosis not present

## 2019-10-27 DIAGNOSIS — C50411 Malignant neoplasm of upper-outer quadrant of right female breast: Secondary | ICD-10-CM | POA: Diagnosis not present

## 2019-10-27 DIAGNOSIS — Z170 Estrogen receptor positive status [ER+]: Secondary | ICD-10-CM | POA: Diagnosis not present

## 2019-10-27 MED ORDER — SIMVASTATIN 20 MG PO TABS: 20.0000 mg | ORAL_TABLET | Freq: Every day | ORAL | 0 refills | Status: DC

## 2019-10-27 NOTE — Progress Notes (Signed)
Patient Care Team: [name], MD as PCP - General (Family Medicine) [name], RN as Oncology Nurse Navigator Samantha Kaufmann, RN as Oncology Nurse Navigator  DIAGNOSIS:    ICD-10-CM   1. Malignant neoplasm of upper-outer quadrant of right breast in female, estrogen receptor positive ([place])  C50.411    Z17.0     SUMMARY OF ONCOLOGIC HISTORY: Oncology History  Malignant neoplasm of upper-outer quadrant of right breast in female, estrogen receptor positive ([place])  04/23/2018 Initial Diagnosis   Palpable lump in right breast UOQ, mammogram revealed 2 separate masses.  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  The Multi-Gene Panel offered by [facility] includes sequencing and/or deletion duplication testing of the following 85 genes: AIP, ALK, APC, ATM, AXIN2,BAP1,  BARD1, BLM, BMPR1A, BRCA1, BRCA2, BRIP1, CASR, CDC73, CDH1, CDK4, CDKN1B, CDKN1C, CDKN2A (p14ARF), CDKN2A (p16INK4a), CEBPA, CHEK2, CTNNA1, DICER1, DIS3L2, EGFR (c.2369C>T, p.Thr790Met variant only), EPCAM (Deletion/duplication testing only), FH, FLCN, GATA2, GPC3, GREM1 (Promoter region deletion/duplication testing only), HOXB13 (c.251G>A, p.Gly84Glu), HRAS, KIT, MAX, MEN1, MET, MITF (c.952G>A, p.Glu318Lys variant only), MLH1, MSH2, MSH3, MSH6, MUTYH, NBN, NF1, NF2, NTHL1, PALB2, PDGFRA, PHOX2B, PMS2, POLD1, POLE, POT1, PRKAR1A, PTCH1, PTEN, RAD50, RAD51C, RAD51D, RB1, RECQL4, RET, RNF43, RUNX1,  SDHAF2, SDHA (sequence changes only), SDHB, SDHC, SDHD, SMAD4, SMARCA4, SMARCB1, SMARCE1, STK11, SUFU, TERC, TERT, TMEM127, TP53, TSC1, TSC2, VHL, WRN and WT1.     12/24/2018 Surgery   Right lumpectomy [name]): residual invasive carcinoma, clear margins, and 2/3 lymph nodes positive for carcinoma.    01/14/2019 Cancer Staging   Staging form: Breast, AJCC 8th Edition - Pathologic stage from 01/14/2019: No Stage Recommended (ypT2, pN1a, cM0, G2, ER+, PR-, HER2-) - Signed by Samantha Gibson, MD on 01/14/2019   01/22/2019 Miscellaneous   MammaPrint: High risk luminal type B   03/11/2019 Surgery   Bilateral mastectomies [name]):   Left breast: benign tissue and two negative lymph nodes Right breast: residual IDC 0.5 cm, clear margins, 2 lymph nodes negativeER 95%, PR 0%, HER-2 -1+, Ki-67 10%   04/14/2019 -  Chemotherapy   The patient had DOXOrubicin (ADRIAMYCIN) chemo injection 102 mg, 60 mg/m2 = 102 mg, Intravenous,  Once, 4 of 4 cycles Dose modification: 50 mg/m2 (original dose 60 mg/m2, Cycle 2, Reason: Dose not tolerated) Administration: 102 mg (04/14/2019), 84 mg (04/28/2019), 84 mg (05/12/2019), 84 mg (05/26/2019) palonosetron (ALOXI) injection 0.25 mg, 0.25 mg, Intravenous,  Once, 4 of 4 cycles Administration: 0.25 mg (04/14/2019), 0.25 mg (04/28/2019), 0.25 mg (05/12/2019), 0.25 mg (05/26/2019) pegfilgrastim-jmdb (FULPHILA) injection 6 mg, 6 mg, Subcutaneous,  Once, 4 of 4 cycles Administration: 6 mg (04/16/2019), 6 mg (04/30/2019), 6 mg (05/14/2019), 6 mg (05/28/2019) cyclophosphamide (CYTOXAN) 1,020 mg in sodium chloride 0.9 % 250 mL chemo infusion, 600 mg/m2 = 1,020 mg, Intravenous,  Once, 4 of 4 cycles Dose modification: 500 mg/m2 (original dose 600 mg/m2, Cycle 2, Reason: Dose not tolerated) Administration: 1,020 mg (04/14/2019), 840 mg (  04/28/2019), 840 mg (05/12/2019), 840 mg (05/26/2019) PACLitaxel (TAXOL) 138 mg in sodium chloride 0.9 % 250 mL chemo infusion (</= 29m/m2), 80 mg/m2 = 138  mg, Intravenous,  Once, 12 of 12 cycles Dose modification: 65 mg/m2 (original dose 80 mg/m2, Cycle 7, Reason: Dose not tolerated, Comment: neuropathy) Administration: 138 mg (06/09/2019), 108 mg (06/16/2019), 108 mg (06/23/2019), 108 mg (06/30/2019), 108 mg (07/07/2019), 108 mg (07/14/2019), 108 mg (07/21/2019), 108 mg (07/28/2019), 108 mg (08/04/2019), 108 mg (08/11/2019), 108 mg (08/18/2019), 108 mg (08/25/2019) fosaprepitant (EMEND) 150 mg, dexamethasone (DECADRON) 12 mg in sodium chloride 0.9 % 145 mL IVPB, , Intravenous,  Once, 4 of 4 cycles Administration:  (04/14/2019),  (04/28/2019),  (05/12/2019),  (05/26/2019)  for chemotherapy treatment.    09/22/2019 -  Radiation Therapy   Adjuvant radiation therapy     CHIEF COMPLIANT: Follow-up of right breast cancer to discuss antiestrogen therapy  INTERVAL HISTORY: DZoraida Cross a 74y.o. with above-mentioned history of right breast cancer treated with neoadjuvant letrozole, lumpectomy followed by bilateral mastectomies, adjuvant chemotherapy, and is currently on radiation therapy.She presents to the clinic today to discuss antiestrogen therapy.   ALLERGIES:  is allergic to lisinopril; alendronate [alendronate]; and hydroxyzine.  MEDICATIONS:  Current Outpatient Medications  Medication Sig Dispense Refill  . amLODipine (NORVASC) 10 MG tablet Take 1 tablet (10 mg total) by mouth daily. MUST SCHEDULE AND KEEP APPOINTMENT FOR REFILLS. 90 tablet 0  . CLOBETASOL PROPIONATE E 0.05 % emollient cream APPLY EXTERNALLY TO THE AFFECTED AREA TWICE DAILY (Patient taking differently: Apply 1 application topically 2 (two) times daily as needed (rash). ) 45 g prn  . lidocaine-prilocaine (EMLA) cream Apply to affected area once 30 g 3  . OVER THE COUNTER MEDICATION Take 2 capsules by mouth 2 (two) times daily as needed (Supplement). [employer]   . simvastatin (ZOCOR) 20 MG tablet Take 1 tablet (20 mg total) by mouth at bedtime. [facility] MUST  SCHEDULE AND KEEP APPOINTMENT AND HAVE LABS DONE FOR REFILLS 30 tablet 0   No current facility-administered medications for this visit.    PHYSICAL EXAMINATION: ECOG PERFORMANCE STATUS: 1 - Symptomatic but completely ambulatory  Vitals:   10/28/19 1048  BP: 123/76  Pulse: 65  Resp: 18  Temp: 98.5 F (36.9 C)  SpO2: 100%   Filed Weights   10/28/19 1048  Weight: 131 lb 12.8 oz (59.8 kg)      LABORATORY DATA:  I have reviewed the data as listed CMP Latest Ref Rng & Units 08/25/2019 08/18/2019 08/11/2019  Glucose 70 - 99 mg/dL 106(H) 105(H) 91  BUN 8 - 23 mg/dL 9 7(L) 8  Creatinine 0.44 - 1.00 mg/dL 0.73 0.66 0.62  Sodium 135 - 145 mmol/L 143 142 141  Potassium 3.5 - 5.1 mmol/L 3.4(L) 3.6 3.5  Chloride 98 - 111 mmol/L 108 109 108  CO2 22 - 32 mmol/L _0 Calcium 8.9 - 10.3 mg/dL 8.9 8.9 8.7(L)  Total Protein 6.5 - 8.1 g/dL 6.4(L) 6.5 6.4(L)  Total Bilirubin 0.3 - 1.2 mg/dL 0.6 0.6 0.6  Alkaline Phos 38 - 126 U/L 67 65 64  AST 15 - 41 U/L 14(L) 13(L) 13(L)  ALT 0 - 44 U/L _1 Lab Results  Component Value Date   WBC 6.0 08/25/2019   HGB 12.4 08/25/2019   HCT 37.7 08/25/2019   MCV 98.2 08/25/2019   PLT 264 08/25/2019   NEUTROABS 4.1 08/25/2019  ASSESSMENT & PLAN:  Malignant neoplasm of upper-outer quadrant of right breast in female, estrogen receptor positive ([place]) 04/23/2018 palpable lump in right breast UOQ, mammogram revealed 2 separate masses. 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, ER 95%, PR 0%, Ki-67 10%, HER-2 negative IHC 1+ T3 N0 stage 3A  MRI breast 05/19/2018: 5.7 x 4.9 x 3.3 cm mass central right breast, no lymph nodes 05/22/2018: CT CAP: Right renal lesion 06/06/2017: MRI abdomen: Complex cystic lesion right kidney, probably benign but requires follow-up in 6 months  Right lumpectomy: Residual invasive ductal carcinoma  3.8 cm, margins negative, 2/3 lymph nodes positive, ER 95%, PR 0%, HER-2 negative, Ki-67 10%  Treatment plan: 1.Neoadjuvant antiestrogen therapy12/02/2018-July 2020 3.12/24/2018 Right lumpectomy: Residual invasive ductal carcinoma 3.8 cm, margins negative, 2/3 lymph nodes positive, ER 95%, PR 0%, HER-2 negative, Ki-67 10%  03/11/2019 bilateral mastectomies [name]):  Left breast: benign tissue and two negative lymph nodes Right breast: residual IDC0.5 cm , clear margins, 2 lymph nodes negative, ER 95%, PR 0%, HER-2 -1+, Ki-67 10%  4.MammaPrint high risk: Adjuvant chemotherapy with dose dense Adriamycin and Cytoxan followed by Taxolcompleted 08/25/2019 5.adjuvant radiation therapy 09/22/19-10/28/19 5.Followed by adjuvant antiestrogen therapy ----------------------------------------------------------------------------------------------------------------------------------------------- clinical trialSWOG 4888 ---------------------------------------------------------------------------------------------------------------------------------- Anastrozole counseling:We discussed the risks and benefits of anti-estrogen therapy with aromatase inhibitors. These include but not limited to insomnia, hot flashes, mood changes, vaginal dryness, bone density loss, and weight gain. We strongly believe that the benefits far outweigh the risks. Patient understands these risks and consented to starting treatment. Planned treatment duration is 7 years. She took anastrozole previously and she will resume it on 11/17/2019.  Return to clinic in 6 months for follow-up.  No orders of the defined types were placed in this encounter.  The patient has a good understanding of the overall plan. she agrees with it. she will call with any problems that may develop before the next visit here.  Total time spent: 20 mins including face to face time and time spent for planning, charting and coordination of care  Samantha Lose, MD 10/28/2019  I, Cloyde Reams Dorshimer, am acting as scribe for Dr. Nicholas Cross.  I have reviewed the above documentation for accuracy and completeness, and I agree with the above.

## 2019-10-28 ENCOUNTER — Other Ambulatory Visit: Payer: Self-pay

## 2019-10-28 ENCOUNTER — Inpatient Hospital Stay: Payer: Medicare Other | Attending: Hematology and Oncology | Admitting: Hematology and Oncology

## 2019-10-28 ENCOUNTER — Ambulatory Visit
Admission: RE | Admit: 2019-10-28 | Discharge: 2019-10-28 | Disposition: A | Discharge status: Home / Self Care | Payer: Medicare Other | Source: Ambulatory Visit | Attending: Radiation Oncology | Admitting: Radiation Oncology

## 2019-10-28 DIAGNOSIS — Z9013 Acquired absence of bilateral breasts and nipples: Secondary | ICD-10-CM | POA: Diagnosis not present

## 2019-10-28 DIAGNOSIS — Z170 Estrogen receptor positive status [ER+]: Secondary | ICD-10-CM | POA: Diagnosis not present

## 2019-10-28 DIAGNOSIS — Z923 Personal history of irradiation: Secondary | ICD-10-CM | POA: Diagnosis not present

## 2019-10-28 DIAGNOSIS — Z9221 Personal history of antineoplastic chemotherapy: Secondary | ICD-10-CM | POA: Diagnosis not present

## 2019-10-28 DIAGNOSIS — Z79811 Long term (current) use of aromatase inhibitors: Secondary | ICD-10-CM | POA: Diagnosis not present

## 2019-10-28 DIAGNOSIS — C50411 Malignant neoplasm of upper-outer quadrant of right female breast: Secondary | ICD-10-CM | POA: Insufficient documentation

## 2019-10-28 DIAGNOSIS — Z510 Encounter for antineoplastic radiation therapy: Secondary | ICD-10-CM | POA: Diagnosis not present

## 2019-10-28 NOTE — Assessment & Plan Note (Signed)
04/23/2018 palpable lump in right breast UOQ, mammogram revealed 2 separate masses. 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, margins negative, 2/3 lymph nodes positive, ER 95%, PR 0%, HER-2 negative, Ki-67 10%  Treatment plan: 1.Neoadjuvant antiestrogen therapy12/02/2018-July 2020 3.12/24/2018 Right lumpectomy: Residual invasive ductal carcinoma 3.8 cm, margins negative, 2/3 lymph nodes positive, ER 95%, PR 0%, HER-2 negative, Ki-67 10%  03/11/2019 bilateral mastectomies [name]):  Left breast: benign tissue and two negative lymph nodes Right breast: residual IDC0.5 cm , clear margins, 2 lymph nodes negative, ER 95%, PR 0%, HER-2 -1+, Ki-67 10%  4.MammaPrint high risk: Adjuvant chemotherapy with dose dense Adriamycin and Cytoxan followed by Taxolcompleted 08/25/2019 5.adjuvant radiation therapy 09/22/19-10/28/19 5.Followed by adjuvant antiestrogen therapy ----------------------------------------------------------------------------------------------------------------------------------------------- clinical trialSWOG 8115 ----------------------------------------------------------------------------------------------------------------------------------

## 2019-10-29 ENCOUNTER — Other Ambulatory Visit: Payer: Self-pay

## 2019-10-29 ENCOUNTER — Telehealth: Payer: Self-pay | Admitting: Hematology and Oncology

## 2019-10-29 ENCOUNTER — Encounter: Payer: Self-pay | Admitting: *Deleted

## 2019-10-29 ENCOUNTER — Ambulatory Visit
Admission: RE | Admit: 2019-10-29 | Discharge: 2019-10-29 | Disposition: A | Discharge status: Home / Self Care | Payer: Medicare Other | Source: Ambulatory Visit | Attending: Radiation Oncology | Admitting: Radiation Oncology

## 2019-10-29 DIAGNOSIS — Z510 Encounter for antineoplastic radiation therapy: Secondary | ICD-10-CM | POA: Diagnosis not present

## 2019-10-29 DIAGNOSIS — Z170 Estrogen receptor positive status [ER+]: Secondary | ICD-10-CM | POA: Diagnosis not present

## 2019-10-29 DIAGNOSIS — C50411 Malignant neoplasm of upper-outer quadrant of right female breast: Secondary | ICD-10-CM | POA: Diagnosis not present

## 2019-10-29 NOTE — Telephone Encounter (Signed)
Scheduled per 05/26 los, patient has been called and notified. 

## 2019-10-30 ENCOUNTER — Encounter (HOSPITAL_BASED_OUTPATIENT_CLINIC_OR_DEPARTMENT_OTHER): Payer: Self-pay | Admitting: General Surgery

## 2019-10-30 ENCOUNTER — Ambulatory Visit
Admission: RE | Admit: 2019-10-30 | Discharge: 2019-10-30 | Disposition: A | Discharge status: Home / Self Care | Payer: Medicare Other | Source: Ambulatory Visit | Attending: Radiation Oncology | Admitting: Radiation Oncology

## 2019-10-30 ENCOUNTER — Other Ambulatory Visit: Payer: Self-pay

## 2019-10-30 ENCOUNTER — Encounter: Payer: Self-pay | Admitting: Radiation Oncology

## 2019-10-30 DIAGNOSIS — Z170 Estrogen receptor positive status [ER+]: Secondary | ICD-10-CM

## 2019-10-30 DIAGNOSIS — Z510 Encounter for antineoplastic radiation therapy: Secondary | ICD-10-CM | POA: Diagnosis not present

## 2019-10-30 DIAGNOSIS — C50411 Malignant neoplasm of upper-outer quadrant of right female breast: Secondary | ICD-10-CM | POA: Diagnosis not present

## 2019-10-30 MED ORDER — SONAFINE EX EMUL
1.0000 "application " | Freq: Two times a day (BID) | CUTANEOUS | Status: DC
  Administered 2019-10-30: 1 via TOPICAL

## 2019-11-03 ENCOUNTER — Other Ambulatory Visit (HOSPITAL_COMMUNITY)
Admission: RE | Admit: 2019-11-03 | Discharge: 2019-11-03 | Disposition: A | Discharge status: Home / Self Care | Payer: Medicare Other | Source: Ambulatory Visit | Attending: General Surgery | Admitting: General Surgery

## 2019-11-03 DIAGNOSIS — Z01812 Encounter for preprocedural laboratory examination: Secondary | ICD-10-CM | POA: Diagnosis not present

## 2019-11-03 DIAGNOSIS — Z20822 Contact with and (suspected) exposure to covid-19: Secondary | ICD-10-CM | POA: Diagnosis not present

## 2019-11-03 LAB — SARS CORONAVIRUS 2 (TAT 6-24 HRS): SARS Coronavirus 2: NEGATIVE

## 2019-11-03 NOTE — Progress Notes (Signed)

## 2019-11-05 NOTE — Anesthesia Preprocedure Evaluation (Addendum)
Anesthesia Evaluation  Patient identified by MRN, date of birth, ID band Patient awake    Reviewed: Allergy & Precautions, H&P , NPO status , Patient's Chart, lab work & pertinent test results  Airway Mallampati: I  TM Distance: >3 FB Neck ROM: Full   Comment: Cleft palate repair  Dental no notable dental hx. (+) Teeth Intact, Dental Advisory Given   Pulmonary former smoker,  Quit smoking 2000   Pulmonary exam normal breath sounds clear to auscultation       Cardiovascular hypertension, Pt. on medications  Rhythm:Regular Rate:Normal     Neuro/Psych negative neurological ROS  negative psych ROS   GI/Hepatic negative GI ROS, Neg liver ROS,   Endo/Other  negative endocrine ROS  Renal/GU negative Renal ROS  negative genitourinary   Musculoskeletal  (+) Arthritis , Osteoarthritis,    Abdominal Normal abdominal exam  (+)   Peds  Hematology negative hematology ROS (+)   Anesthesia Other Findings Hx breast ca s/p mastectomy 03/2019  Hx cleft palate  Reproductive/Obstetrics negative OB ROS                           Anesthesia Physical Anesthesia Plan  ASA: III  Anesthesia Plan: MAC   Post-op Pain Management:    Induction:   PONV Risk Score and Plan: 2 and Propofol infusion and TIVA  Airway Management Planned: Natural Airway and Simple Face Mask  Additional Equipment: None  Intra-op Plan:   Post-operative Plan:   Informed Consent: I have reviewed the patients History and Physical, chart, labs and discussed the procedure including the risks, benefits and alternatives for the proposed anesthesia with the patient or authorized representative who has indicated his/her understanding and acceptance.       Plan Discussed with: CRNA  Anesthesia Plan Comments:         Anesthesia Quick Evaluation

## 2019-11-06 ENCOUNTER — Ambulatory Visit (HOSPITAL_BASED_OUTPATIENT_CLINIC_OR_DEPARTMENT_OTHER)
Admission: RE | Admit: 2019-11-06 | Discharge: 2019-11-06 | Disposition: A | Discharge status: Home / Self Care | Payer: Medicare Other | Attending: General Surgery | Admitting: General Surgery

## 2019-11-06 ENCOUNTER — Ambulatory Visit (HOSPITAL_BASED_OUTPATIENT_CLINIC_OR_DEPARTMENT_OTHER): Payer: Medicare Other | Admitting: Anesthesiology

## 2019-11-06 ENCOUNTER — Encounter (HOSPITAL_BASED_OUTPATIENT_CLINIC_OR_DEPARTMENT_OTHER)
Admission: RE | Disposition: A | Discharge status: Home / Self Care | Payer: Self-pay | Source: Home / Self Care | Attending: General Surgery

## 2019-11-06 ENCOUNTER — Other Ambulatory Visit: Payer: Self-pay

## 2019-11-06 ENCOUNTER — Encounter (HOSPITAL_BASED_OUTPATIENT_CLINIC_OR_DEPARTMENT_OTHER): Payer: Self-pay | Admitting: General Surgery

## 2019-11-06 DIAGNOSIS — Z170 Estrogen receptor positive status [ER+]: Secondary | ICD-10-CM | POA: Insufficient documentation

## 2019-11-06 DIAGNOSIS — Z9013 Acquired absence of bilateral breasts and nipples: Secondary | ICD-10-CM | POA: Insufficient documentation

## 2019-11-06 DIAGNOSIS — Z452 Encounter for adjustment and management of vascular access device: Secondary | ICD-10-CM | POA: Insufficient documentation

## 2019-11-06 DIAGNOSIS — E785 Hyperlipidemia, unspecified: Secondary | ICD-10-CM | POA: Diagnosis not present

## 2019-11-06 DIAGNOSIS — Z888 Allergy status to other drugs, medicaments and biological substances status: Secondary | ICD-10-CM | POA: Diagnosis not present

## 2019-11-06 DIAGNOSIS — I10 Essential (primary) hypertension: Secondary | ICD-10-CM | POA: Diagnosis not present

## 2019-11-06 DIAGNOSIS — Z853 Personal history of malignant neoplasm of breast: Secondary | ICD-10-CM | POA: Diagnosis not present

## 2019-11-06 DIAGNOSIS — C50411 Malignant neoplasm of upper-outer quadrant of right female breast: Secondary | ICD-10-CM | POA: Diagnosis not present

## 2019-11-06 HISTORY — PX: PORT-A-CATH REMOVAL: SHX5289

## 2019-11-06 SURGERY — REMOVAL PORT-A-CATH
Anesthesia: Monitor Anesthesia Care | Site: Chest | Laterality: Left

## 2019-11-06 MED ORDER — MIDAZOLAM HCL 2 MG/2ML IJ SOLN
INTRAMUSCULAR | Status: AC
  Filled 2019-11-06: qty 2

## 2019-11-06 MED ORDER — MIDAZOLAM HCL 5 MG/5ML IJ SOLN
INTRAMUSCULAR | Status: DC | PRN
  Administered 2019-11-06: 1 mg via INTRAVENOUS

## 2019-11-06 MED ORDER — ACETAMINOPHEN 500 MG PO TABS
1000.0000 mg | ORAL_TABLET | Freq: Once | ORAL | Status: AC
  Administered 2019-11-06: 1000 mg via ORAL

## 2019-11-06 MED ORDER — HYDROCODONE-ACETAMINOPHEN 5-325 MG PO TABS: 1.0000 | ORAL_TABLET | Freq: Four times a day (QID) | ORAL | 0 refills | Status: DC | PRN

## 2019-11-06 MED ORDER — ACETAMINOPHEN 500 MG PO TABS
ORAL_TABLET | ORAL | Status: AC
  Filled 2019-11-06: qty 2

## 2019-11-06 MED ORDER — LACTATED RINGERS IV SOLN: INTRAVENOUS | Status: DC

## 2019-11-06 MED ORDER — OXYCODONE HCL 5 MG PO TABS: 5.0000 mg | ORAL_TABLET | Freq: Once | ORAL | Status: DC | PRN

## 2019-11-06 MED ORDER — PROPOFOL 500 MG/50ML IV EMUL
INTRAVENOUS | Status: AC
  Filled 2019-11-06: qty 50

## 2019-11-06 MED ORDER — FENTANYL CITRATE (PF) 100 MCG/2ML IJ SOLN: 25.0000 ug | INTRAMUSCULAR | Status: DC | PRN

## 2019-11-06 MED ORDER — FENTANYL CITRATE (PF) 100 MCG/2ML IJ SOLN
INTRAMUSCULAR | Status: AC
  Filled 2019-11-06: qty 2

## 2019-11-06 MED ORDER — LIDOCAINE 2% (20 MG/ML) 5 ML SYRINGE
INTRAMUSCULAR | Status: AC
  Filled 2019-11-06: qty 5

## 2019-11-06 MED ORDER — KETOROLAC TROMETHAMINE 30 MG/ML IJ SOLN: 30.0000 mg | Freq: Once | INTRAMUSCULAR | Status: DC | PRN

## 2019-11-06 MED ORDER — LIDOCAINE-EPINEPHRINE (PF) 1 %-1:200000 IJ SOLN
INTRAMUSCULAR | Status: DC | PRN
  Administered 2019-11-06: 30 mL

## 2019-11-06 MED ORDER — BUPIVACAINE HCL 0.25 % IJ SOLN
INTRAMUSCULAR | Status: DC | PRN
  Administered 2019-11-06: 30 mL

## 2019-11-06 MED ORDER — CHLORHEXIDINE GLUCONATE CLOTH 2 % EX PADS: 6.0000 | MEDICATED_PAD | Freq: Once | CUTANEOUS | Status: DC

## 2019-11-06 MED ORDER — PROMETHAZINE HCL 25 MG/ML IJ SOLN: 6.2500 mg | INTRAMUSCULAR | Status: DC | PRN

## 2019-11-06 MED ORDER — PROPOFOL 500 MG/50ML IV EMUL
INTRAVENOUS | Status: DC | PRN
  Administered 2019-11-06: 75 ug/kg/min via INTRAVENOUS

## 2019-11-06 MED ORDER — FENTANYL CITRATE (PF) 100 MCG/2ML IJ SOLN
INTRAMUSCULAR | Status: DC | PRN
  Administered 2019-11-06: 50 ug via INTRAVENOUS

## 2019-11-06 MED ORDER — OXYCODONE HCL 5 MG/5ML PO SOLN: 5.0000 mg | Freq: Once | ORAL | Status: DC | PRN

## 2019-11-06 MED ORDER — ONDANSETRON HCL 4 MG/2ML IJ SOLN
INTRAMUSCULAR | Status: DC | PRN
  Administered 2019-11-06: 4 mg via INTRAVENOUS

## 2019-11-06 MED ORDER — PROPOFOL 10 MG/ML IV BOLUS
INTRAVENOUS | Status: DC | PRN
  Administered 2019-11-06: 20 mg via INTRAVENOUS

## 2019-11-06 SURGICAL SUPPLY — 26 items
BLADE SURG 15 STRL LF DISP TIS (BLADE) ×1 IMPLANT
BLADE SURG 15 STRL SS (BLADE) ×1
CHLORAPREP W/TINT 26 (MISCELLANEOUS) ×2 IMPLANT
COVER BACK TABLE 60X90IN (DRAPES) ×2 IMPLANT
COVER MAYO STAND STRL (DRAPES) ×2 IMPLANT
COVER WAND RF STERILE (DRAPES) IMPLANT
DECANTER SPIKE VIAL GLASS SM (MISCELLANEOUS) ×2 IMPLANT
DERMABOND ADVANCED (GAUZE/BANDAGES/DRESSINGS) ×1
DERMABOND ADVANCED .7 DNX12 (GAUZE/BANDAGES/DRESSINGS) ×1 IMPLANT
DRAPE LAPAROTOMY 100X72 PEDS (DRAPES) ×2 IMPLANT
DRAPE UTILITY XL STRL (DRAPES) ×2 IMPLANT
ELECT COATED BLADE 2.86 ST (ELECTRODE) IMPLANT
ELECT REM PT RETURN 9FT ADLT (ELECTROSURGICAL)
ELECTRODE REM PT RTRN 9FT ADLT (ELECTROSURGICAL) IMPLANT
GLOVE BIO SURGEON STRL SZ7.5 (GLOVE) ×2 IMPLANT
GOWN STRL REUS W/ TWL LRG LVL3 (GOWN DISPOSABLE) ×2 IMPLANT
GOWN STRL REUS W/TWL LRG LVL3 (GOWN DISPOSABLE) ×2
NEEDLE HYPO 25X1 1.5 SAFETY (NEEDLE) ×2 IMPLANT
PENCIL SMOKE EVACUATOR (MISCELLANEOUS) IMPLANT
SET BASIN DAY SURGERY F.S. (CUSTOM PROCEDURE TRAY) ×2 IMPLANT
SLEEVE SCD COMPRESS KNEE MED (MISCELLANEOUS) IMPLANT
SUT MON AB 4-0 PC3 18 (SUTURE) ×2 IMPLANT
SUT VIC AB 3-0 SH 27 (SUTURE) ×1
SUT VIC AB 3-0 SH 27X BRD (SUTURE) ×1 IMPLANT
SYR CONTROL 10ML LL (SYRINGE) ×2 IMPLANT
TOWEL GREEN STERILE FF (TOWEL DISPOSABLE) ×2 IMPLANT

## 2019-11-06 NOTE — Discharge Instructions (Signed)

## 2019-11-06 NOTE — Anesthesia Postprocedure Evaluation (Signed)
Anesthesia Post Note  Patient: Samantha Cross  Procedure(s) Performed: REMOVAL OF PORT (Left Chest)     Patient location during evaluation: PACU Anesthesia Type: MAC Level of consciousness: awake and alert Pain management: pain level controlled Vital Signs Assessment: post-procedure vital signs reviewed and stable Respiratory status: spontaneous breathing, nonlabored ventilation and respiratory function stable Cardiovascular status: blood pressure returned to baseline and stable Postop Assessment: no apparent nausea or vomiting Anesthetic complications: no    Last Vitals:  Vitals:   11/06/19 0900 11/06/19 0915  BP: 100/64 102/64  Pulse: 61 (!) 59  Resp: 18 20  Temp:    SpO2: 98% 97%    Last Pain:  Vitals:   11/06/19 0915  TempSrc:   PainSc: 0-No pain                 Pervis Hocking

## 2019-11-06 NOTE — Op Note (Signed)
11/06/2019  8:47 AM  PATIENT:  Samantha Cross  74 y.o. female  PRE-OPERATIVE DIAGNOSIS:  HISTORY OF RIGHT BREAST CANCER  POST-OPERATIVE DIAGNOSIS:  HISTORY OF RIGHT BREAST CANCER  PROCEDURE:  Procedure(s): REMOVAL OF PORT (Left)  SURGEON:  Surgeon(s) and Role:    * Jovita Kussmaul, MD - Primary  PHYSICIAN ASSISTANT:   ASSISTANTS: none   ANESTHESIA:   local and IV sedation  EBL:  Minimal   BLOOD ADMINISTERED:none  DRAINS: none   LOCAL MEDICATIONS USED:  MARCAINE    and LIDOCAINE   SPECIMEN:  No Specimen  DISPOSITION OF SPECIMEN:  N/A  COUNTS:  YES  TOURNIQUET:  * No tourniquets in log *  DICTATION: .Dragon Dictation   After informed consent was obtained the patient was brought to the operating room and placed in the supine position on the operating table.  After IV sedation had been given the patient's left chest was prepped with ChloraPrep, allowed to dry, and draped in usual sterile manner.  An appropriate timeout was performed.  The area around the port was infiltrated with quarter percent Marcaine and 1% lidocaine until a good field block was created.  A small incision was then made through her previous incision with a 15 blade knife.  The incision was carried through the skin and subcutaneous tissue sharply with a 15 blade knife until the capsule surrounding the port was opened.  The 2 anchoring stitches were divided and removed.  The port was then gently pushed out of its pocket and with gentle traction was removed from the patient without difficulty.  Pressure was held on the area of the chest in the neck for several minutes until the area was completely hemostatic.  The deep layer of the incision on the chest wall was then closed with interrupted 3-0 Vicryl stitches.  The skin was closed with a running 4-0 Monocryl subcuticular stitch.  Dermabond dressings were applied.  The patient tolerated the procedure well.  At the end of the case all needle sponge and  instrument counts were correct.  The patient was then awakened and taken to recovery in stable condition.  PLAN OF CARE: Discharge to home after PACU  PATIENT DISPOSITION:  PACU - hemodynamically stable.   Delay start of Pharmacological VTE agent (>24hrs) due to surgical blood loss or risk of bleeding: not applicable

## 2019-11-06 NOTE — Transfer of Care (Signed)
Immediate Anesthesia Transfer of Care Note  Patient: Samantha Cross [facility]  Procedure(s) Performed: REMOVAL OF PORT (Left Chest)  Patient Location: PACU  Anesthesia Type:MAC  Level of Consciousness: awake, alert  and oriented  Airway & Oxygen Therapy: Patient Spontanous Breathing  Post-op Assessment: Report given to RN and Post -op Vital signs reviewed and stable  Post vital signs: Reviewed and stable  Last Vitals:  Vitals Value Taken Time  BP 101/61 11/06/19 0851  Temp    Pulse 63 11/06/19 0852  Resp 12 11/06/19 0852  SpO2 99 % 11/06/19 0852  Vitals shown include unvalidated device data.  Last Pain:  Vitals:   11/06/19 0732  TempSrc: Oral  PainSc: 0-No pain      Patients Stated Pain Goal: 3 (05/69/79 4801)  Complications: No apparent anesthesia complications

## 2019-11-06 NOTE — Interval H&P Note (Signed)
History and Physical Interval Note:  11/06/2019 8:07 AM  Samantha Cross  has presented today for surgery, with the diagnosis of HISTORY OF RIGHT BREAST CANCER.  The various methods of treatment have been discussed with the patient and family. After consideration of risks, benefits and other options for treatment, the patient has consented to  Procedure(s): REMOVAL OF PORT (N/A) as a surgical intervention.  The patient's history has been reviewed, patient examined, no change in status, stable for surgery.  I have reviewed the patient's chart and labs.  Questions were answered to the patient's satisfaction.     [name]

## 2019-11-06 NOTE — H&P (Signed)
Samantha Cross  Location: [facility] Patient #: [id number] DOB: [birth year] Married / Language: English / Race: White Female   History of Present Illness  The patient is a 74 year old female who presents for a follow-up for Breast cancer. The patient is a 74 year old white female who is about 3 months status post right mastectomy and sentinel node biopsy for a ypT2y pN1 A right breast cancer that was ER positive and PR negative and HER-2 negative with a Ki-67 of 10%. She also had a left prophylactic mastectomy. She initially was treated with lumpectomy but then subsequently decided on bilateral mastectomies. She has 1 more month left of chemotherapy and then she will think about starting radiation therapy. She just realize she also had a prescription for Femara and was not sure when she was supposed to take this. Otherwise she denies any chest wall pain.   Allergies  Lisinopril *ANTIHYPERTENSIVES*  Alendronate Sodium *ENDOCRINE AND METABOLIC AGENTS - MISC.*  hydrOXYzine HCl *ANTIANXIETY AGENTS*  Allergies Reconciled   Medication History  amLODIPine Besylate (10MG Tablet, Oral) Active. Simvastatin (20MG Tablet, Oral) Active. Medications Reconciled    Review of Systems  General Not Present- Appetite Loss, Chills, Fatigue, Fever, Night Sweats, Weight Gain and Weight Loss. Skin Not Present- Change in Wart/Mole, Dryness, Hives, Jaundice, New Lesions, Non-Healing Wounds, Rash and Ulcer. HEENT Not Present- Earache, Hearing Loss, Hoarseness, Nose Bleed, Oral Ulcers, Ringing in the Ears, Seasonal Allergies, Sinus Pain, Sore Throat, Visual Disturbances, Wears glasses/contact lenses and Yellow Eyes. Cardiovascular Not Present- Chest Pain, Difficulty Breathing Lying Down, Leg Cramps, Palpitations, Rapid Heart Rate, Shortness of Breath and Swelling of Extremities. Gastrointestinal Not Present- Abdominal Pain, Bloating, Bloody Stool, Change in Bowel Habits, Chronic  diarrhea, Constipation, Difficulty Swallowing, Excessive gas, Gets full quickly at meals, Hemorrhoids, Indigestion, Nausea, Rectal Pain and Vomiting. Female Genitourinary Not Present- Frequency, Nocturia, Painful Urination, Pelvic Pain and Urgency. Musculoskeletal Not Present- Back Pain, Joint Pain, Joint Stiffness, Muscle Pain, Muscle Weakness and Swelling of Extremities. Neurological Not Present- Decreased Memory, Fainting, Headaches, Numbness, Seizures, Tingling, Tremor, Trouble walking and Weakness. Psychiatric Not Present- Anxiety, Bipolar, Change in Sleep Pattern, Depression, Fearful and Frequent crying. Endocrine Not Present- Cold Intolerance, Excessive Hunger, Hair Changes, Heat Intolerance, Hot flashes and New Diabetes. Hematology Not Present- Blood Thinners, Easy Bruising, Excessive bleeding, Gland problems, HIV and Persistent Infections.  Vitals Weight: 138 lb Height: 63in Body Surface Area: 1.65 m Body Mass Index: 24.45 kg/m  Temp.: 97.67F  Pulse: 80 (Regular)  BP: 122/64(Sitting, Left Arm, Standard)       Physical Exam  General Mental Status-Alert. General Appearance-Consistent with stated age. Hydration-Well hydrated. Voice-Normal.  Head and Neck Head-normocephalic, atraumatic with no lesions or palpable masses. Trachea-midline. Thyroid Gland Characteristics - normal size and consistency.  Eye Eyeball - Bilateral-Extraocular movements intact. Sclera/Conjunctiva - Bilateral-No scleral icterus.  Chest and Lung Exam Chest and lung exam reveals -quiet, even and easy respiratory effort with no use of accessory muscles and on auscultation, normal breath sounds, no adventitious sounds and normal vocal resonance. Inspection Chest Wall - Normal. Back - normal.  Breast Note: Both mastectomy incisions are healing nicely with no sign of infection or seroma. There is no palpable mass of either chest wall. There is no palpable axillary,  supraclavicular, or cervical lymphadenopathy.   Cardiovascular Cardiovascular examination reveals -normal heart sounds, regular rate and rhythm with no murmurs and normal pedal pulses bilaterally.  Abdomen Inspection Inspection of the abdomen reveals - No Hernias. Skin - Scar -  no surgical scars. Palpation/Percussion Palpation and Percussion of the abdomen reveal - Soft, Non Tender, No Rebound tenderness, No Rigidity (guarding) and No hepatosplenomegaly. Auscultation Auscultation of the abdomen reveals - Bowel sounds normal.  Neurologic Neurologic evaluation reveals -alert and oriented x 3 with no impairment of recent or remote memory. Mental Status-Normal.  Musculoskeletal Normal Exam - Left-Upper Extremity Strength Normal and Lower Extremity Strength Normal. Normal Exam - Right-Upper Extremity Strength Normal and Lower Extremity Strength Normal.  Lymphatic Head & Neck  General Head & Neck Lymphatics: Bilateral - Description - Normal. Axillary  General Axillary Region: Bilateral - Description - Normal. Tenderness - Non Tender. Femoral & Inguinal  Generalized Femoral & Inguinal Lymphatics: Bilateral - Description - Normal. Tenderness - Non Tender.    Assessment & Plan  MALIGNANT NEOPLASM OF UPPER-OUTER QUADRANT OF RIGHT BREAST IN FEMALE, ESTROGEN RECEPTOR POSITIVE (C50.411) Impression: The patient is about 3 months status post right mastectomy for breast cancer as well as a left prophylactic mastectomy. She has 1 more month of chemotherapy left before she starts radiation. She is wondering when she should start taking Femara. I will defer this question to the oncologist. Otherwise she continues to do well with no clinical evidence of recurrence. I will plan to see her back in about 6 months. Current Plans Follow up with [place] in the office in 6 MONTHS.   Call us sooner as needed.  Presents today for port removal. Risks and benefits discussed with pt and she  understands and wishes to proceed

## 2019-11-09 ENCOUNTER — Encounter: Payer: Self-pay | Admitting: *Deleted

## 2019-11-10 ENCOUNTER — Encounter: Payer: Self-pay | Admitting: Family Medicine

## 2019-11-10 ENCOUNTER — Other Ambulatory Visit: Payer: Self-pay

## 2019-11-10 ENCOUNTER — Ambulatory Visit (INDEPENDENT_AMBULATORY_CARE_PROVIDER_SITE_OTHER): Payer: Medicare Other | Admitting: Family Medicine

## 2019-11-10 VITALS — BP 137/80 | HR 76 | Temp 98.0°F | Ht 63.0 in | Wt 133.0 lb

## 2019-11-10 DIAGNOSIS — H6690 Otitis media, unspecified, unspecified ear: Secondary | ICD-10-CM

## 2019-11-10 DIAGNOSIS — E7800 Pure hypercholesterolemia, unspecified: Secondary | ICD-10-CM

## 2019-11-10 DIAGNOSIS — G4700 Insomnia, unspecified: Secondary | ICD-10-CM

## 2019-11-10 DIAGNOSIS — I10 Essential (primary) hypertension: Secondary | ICD-10-CM | POA: Diagnosis not present

## 2019-11-10 DIAGNOSIS — Z23 Encounter for immunization: Secondary | ICD-10-CM

## 2019-11-10 DIAGNOSIS — Z95828 Presence of other vascular implants and grafts: Secondary | ICD-10-CM | POA: Diagnosis not present

## 2019-11-10 DIAGNOSIS — Z0000 Encounter for general adult medical examination without abnormal findings: Secondary | ICD-10-CM | POA: Diagnosis not present

## 2019-11-10 MED ORDER — SHINGRIX 50 MCG/0.5ML IM SUSR: 0.5000 mL | Freq: Once | INTRAMUSCULAR | 0 refills | Status: AC

## 2019-11-10 MED ORDER — AMLODIPINE BESYLATE 10 MG PO TABS: 10.0000 mg | ORAL_TABLET | Freq: Every day | ORAL | 2 refills | Status: DC

## 2019-11-10 MED ORDER — SIMVASTATIN 20 MG PO TABS: 20.0000 mg | ORAL_TABLET | Freq: Every day | ORAL | 3 refills | Status: DC

## 2019-11-10 NOTE — Patient Instructions (Signed)
Can try melatonin 3 mg about half an hour before bedtime for sleep or can try Tylenol PM.

## 2019-11-10 NOTE — Assessment & Plan Note (Signed)
Removed 11/06/2019

## 2019-11-10 NOTE — Progress Notes (Addendum)
Established Patient Office Visit - CPE  Subjective:  Patient ID: Samantha Cross, female    DOB: 11/12/1945  Age: 74 y.o. MRN: [id number]  CC:  Chief Complaint  Patient presents with  . Hypertension    HPI [facility] presents for CPE.  Have not seen her since 2019 when she was diagnosed with right breast cancer.  She recently finished her chemo and radiation and actually just had her port removed on June 4.  She is still healing from the radiation and has a lot of skin irritation and peeling for which she uses a cream but she feels like she is gradually getting her energy back in fact eczema the lawn this weekend.  Hypertension- Pt denies chest pain, SOB, dizziness, or heart palpitations.  Taking meds as directed w/o problems.  Denies medication side effects.    Hyperlipidemia - tolerating stating well with no myalgias or significant side effects.  Lab Results  Component Value Date   CHOL 202 (H) 04/09/2018   HDL 74 04/09/2018   LDLCALC 111 (H) 04/09/2018   TRIG 82 04/09/2018   CHOLHDL 2.7 04/09/2018   She has been having problems with sleep. She will usually doze off watching TV, then go to bed and wake up around 2-3AM and then can't go back to sleep.    Past Medical History:  Diagnosis Date  . Arthritis   . Diverticulosis   . Family history of brain cancer   . Family history of breast cancer   . Family history of colon cancer   . Family history of melanoma   . Family history of ovarian cancer   . Hemorrhoids   . Hyperlipidemia   . Hypertension   . Postmenopausal   . rt breast ca dx'd 05/2018    Past Surgical History:  Procedure Laterality Date  . ABDOMINAL HYSTERECTOMY     without BSO/heavy periods  . BLADDER SURGERY  when she was in her 30's   Her bladder was punctured during her hysterectomy and needed to be repaired.   Marland Kitchen BREAST LUMPECTOMY WITH RADIOACTIVE SEED AND SENTINEL LYMPH NODE BIOPSY Right 12/24/2018   Procedure: RIGHT BREAST  LUMPECTOMY WITH RADIOACTIVE SEED AND SENTINEL LYMPH NODE BIOPSY;  Surgeon: Jovita Kussmaul, MD;  Location: [place];  Service: General;  Laterality: Right;  . CLEFT PALATE REPAIR    . MIDDLE EAR SURGERY     left  . PORT-A-CATH REMOVAL Left 11/06/2019   Procedure: REMOVAL OF PORT;  Surgeon: Jovita Kussmaul, MD;  Location: [place];  Service: General;  Laterality: Left;  . PORTACATH PLACEMENT Left 03/11/2019   Procedure: INSERTION PORT-A-CATH POSSILBLE ULTRASOUND;  Surgeon: Jovita Kussmaul, MD;  Location: [place];  Service: General;  Laterality: Left;  . TOTAL MASTECTOMY Bilateral 03/11/2019   Procedure: BILATERAL  MASTECTOMIES;  Surgeon: Jovita Kussmaul, MD;  Location: [facility];  Service: General;  Laterality: Bilateral;    Family History  Problem Relation Age of Onset  . Ovarian cancer Mother        dx under 72; d. 67  . Colon cancer Mother 2  . Cancer Brother        brain tumor/gliobastoma, multiforme  . Melanoma Brother   . Kidney cancer Brother 46  . Cancer Brother        renal cell carcinoma, colon ca  . Heart attack Father 19       died/CVA  . Heart attack Brother 16  .  Hyperlipidemia Other   . Breast cancer Sister 63  . Cancer Maternal Aunt        NOS  . Colon cancer Maternal Uncle        dx in his 17s  . Colon cancer Maternal Grandmother        dx over 60    Social History   Socioeconomic History  . Marital status: Married    Spouse name: Not on file  . Number of children: Not on file  . Years of education: Not on file  . Highest education level: Not on file  Occupational History  . Occupation: has a farm.   Tobacco Use  . Smoking status: Former Smoker    Quit date: 06/04/1998    Years since quitting: 21.4  . Smokeless tobacco: Never Used  Substance and Sexual Activity  . Alcohol use: Yes    Alcohol/week: 3.0 standard drinks    Types: 3 Glasses of wine per week    Comment: social  . Drug use: No  . Sexual activity: Not on file     Comment: married, 2 grown children, 2 stepkids, no regular exercise, 3 beagles, working PT at daycare.  Other Topics Concern  . Not on file  Social History Narrative  . Not on file   Social Determinants of Health   Financial Resource Strain:   . Difficulty of Paying Living Expenses:   Food Insecurity:   . Worried About Charity fundraiser in the Last Year:   . Arboriculturist in the Last Year:   Transportation Needs:   . Film/video editor (Medical):   Marland Kitchen Lack of Transportation (Non-Medical):   Physical Activity:   . Days of Exercise per Week:   . Minutes of Exercise per Session:   Stress:   . Feeling of Stress :   Social Connections:   . Frequency of Communication with Friends and Family:   . Frequency of Social Gatherings with Friends and Family:   . Attends Religious Services:   . Active Member of Clubs or Organizations:   . Attends Archivist Meetings:   Marland Kitchen Marital Status:   Intimate Partner Violence: Not At Risk  . Fear of Current or Ex-Partner: No  . Emotionally Abused: No  . Physically Abused: No  . Sexually Abused: No    Outpatient Medications Prior to Visit  Medication Sig Dispense Refill  . CLOBETASOL PROPIONATE E 0.05 % emollient cream APPLY EXTERNALLY TO THE AFFECTED AREA TWICE DAILY (Patient taking differently: Apply 1 application topically 2 (two) times daily as needed (rash). ) 45 g prn  . lidocaine-prilocaine (EMLA) cream Apply to affected area once 30 g 3  . Multiple Vitamins-Minerals (HAIR SKIN AND NAILS FORMULA) TABS Take by mouth.    Marland Kitchen OVER THE COUNTER MEDICATION Take 2 capsules by mouth 2 (two) times daily as needed (Supplement). [employer]    . amLODipine (NORVASC) 10 MG tablet Take 1 tablet (10 mg total) by mouth daily. MUST SCHEDULE AND KEEP APPOINTMENT FOR REFILLS. 90 tablet 0  . simvastatin (ZOCOR) 20 MG tablet Take 1 tablet (20 mg total) by mouth at bedtime. [place]. MUST SCHEDULE AND KEEP APPOINTMENT AND HAVE LABS  DONE FOR REFILLS 30 tablet 0  . HYDROcodone-acetaminophen (NORCO/VICODIN) 5-325 MG tablet Take 1-2 tablets by mouth every 6 (six) hours as needed for moderate pain or severe pain. 10 tablet 0   No facility-administered medications prior to visit.    Allergies  Allergen Reactions  . Lisinopril     Tongue swelling  . Alendronate [Alendronate] Other (See Comments)    Chest pain, palpitations.   . Hydroxyzine Other (See Comments)    Made her feel weird    ROS Review of Systems    Objective:    Physical Exam  Constitutional: She is oriented to person, place, and time. She appears well-developed and well-nourished.  HENT:  Head: Normocephalic and atraumatic.  Right Ear: External ear normal.  Left Ear: External ear normal.  Nose: Nose normal.  Mouth/Throat: Oropharynx is clear and moist.  TMs and canals are clear.   Eyes: Pupils are equal, round, and reactive to light. Conjunctivae and EOM are normal.  Neck: No thyromegaly present.  Cardiovascular: Normal rate, regular rhythm and normal heart sounds.  Pulmonary/Chest: Effort normal and breath sounds normal. She has no wheezes.  Abdominal: Soft. Bowel sounds are normal.  Musculoskeletal:        General: No edema.     Cervical back: Neck supple.  Lymphadenopathy:    She has no cervical adenopathy.  Neurological: She is alert and oriented to person, place, and time. She has normal reflexes.  Skin: Skin is warm and dry. Rash noted.  Significant erythema and skin peeling over the right upper chest as well as the right upper shoulder.  Psychiatric: She has a normal mood and affect. Her behavior is normal. Judgment and thought content normal.    BP 137/80 (BP Location: Left Arm, Patient Position: Sitting)   Pulse 76   Temp 98 F (36.7 C)   Ht 5\' 3"  (1.6 m)   Wt 133 lb (60.3 kg)   SpO2 100%   BMI 23.56 kg/m  Wt Readings from Last 3 Encounters:  11/10/19 133 lb (60.3 kg)  11/06/19 132 lb 15 oz (60.3 kg)  10/28/19 131 lb  12.8 oz (59.8 kg)     There are no preventive care reminders to display for this patient.  There are no preventive care reminders to display for this patient.  No results found for: TSH Lab Results  Component Value Date   WBC 6.0 08/25/2019   HGB 12.4 08/25/2019   HCT 37.7 08/25/2019   MCV 98.2 08/25/2019   PLT 264 08/25/2019   Lab Results  Component Value Date   NA 143 08/25/2019   K 3.4 (L) 08/25/2019   CO2 26 08/25/2019   GLUCOSE 106 (H) 08/25/2019   BUN 9 08/25/2019   CREATININE 0.73 08/25/2019   BILITOT 0.6 08/25/2019   ALKPHOS 67 08/25/2019   AST 14 (L) 08/25/2019   ALT 14 08/25/2019   PROT 6.4 (L) 08/25/2019   ALBUMIN 3.7 08/25/2019   CALCIUM 8.9 08/25/2019   ANIONGAP 9 08/25/2019   Lab Results  Component Value Date   CHOL 202 (H) 04/09/2018   Lab Results  Component Value Date   HDL 74 04/09/2018   Lab Results  Component Value Date   LDLCALC 111 (H) 04/09/2018   Lab Results  Component Value Date   TRIG 82 04/09/2018   Lab Results  Component Value Date   CHOLHDL 2.7 04/09/2018   No results found for: HGBA1C    Assessment & Plan:   Problem List Items Addressed This Visit      Cardiovascular and Mediastinum   Essential hypertension, benign    Well controlled. Continue current regimen. Follow up in  6 mo      Relevant Medications   amLODipine (NORVASC) 10 MG tablet   simvastatin (  ZOCOR) 20 MG tablet   Other Relevant Orders   Lipid panel     Other   Port-A-Cath in place    Removed 11/06/2019      Insomnia   Hyperlipidemia    Doing well on simvastatin.  Refill sent to pharmacy.  Due for updated lipid labs.      Relevant Medications   amLODipine (NORVASC) 10 MG tablet   simvastatin (ZOCOR) 20 MG tablet   Other Relevant Orders   Lipid panel    Other Visit Diagnoses    Wellness examination    -  Primary   Relevant Orders   Lipid panel   Need for Zostavax administration       Relevant Medications   Zoster Vaccine Adjuvanted  [facility]) injection   Ear infection       Relevant Medications   Zoster Vaccine Adjuvanted [facility]) injection   Other Relevant Orders   Ambulatory referral to ENT     Keep up a regular exercise program and make sure you are eating a healthy diet Try to eat 4 servings of dairy a day, or if you are lactose intolerant take a calcium with vitamin D daily.  Your vaccines are up to date.   On exam she actually had what look like a white coating on the bottom part of her ear canal in her right ear it had a most fuzzy or hairy appearance most consistent with a fungal infection.  She has not noticed any increased pain or drainage but she has had complications with both of her ears sound like to go ahead and refer her to ENT to see if she may need additional treatment.  Insomnia - discussed sleep hygiene.  Recommend trial of melatonin or benadryl.  Return if not improving.   Meds ordered this encounter  Medications  . Zoster Vaccine Adjuvanted [facility]) injection    Sig: Inject 0.5 mLs into the muscle once for 1 dose. Repeat next dose in 2 to 6 months    Dispense:  0.5 mL    Refill:  0  . amLODipine (NORVASC) 10 MG tablet    Sig: Take 1 tablet (10 mg total) by mouth daily.    Dispense:  90 tablet    Refill:  2  . simvastatin (ZOCOR) 20 MG tablet    Sig: Take 1 tablet (20 mg total) by mouth at bedtime.    Dispense:  90 tablet    Refill:  3    Follow-up: Return in about 6 months (around 05/11/2020) for Bloed pressure .    Beatrice Lecher, MD

## 2019-11-10 NOTE — Assessment & Plan Note (Signed)
Doing well on simvastatin.  Refill sent to pharmacy.  Due for updated lipid labs.

## 2019-11-10 NOTE — Assessment & Plan Note (Signed)
Well controlled. Continue current regimen. Follow up in  6 mo  

## 2019-11-11 ENCOUNTER — Telehealth: Payer: Self-pay

## 2019-11-11 DIAGNOSIS — E7800 Pure hypercholesterolemia, unspecified: Secondary | ICD-10-CM

## 2019-11-11 LAB — LIPID PANEL
Cholesterol: 203 mg/dL — ABNORMAL HIGH (ref <200)
HDL: 73 mg/dL (ref >=50)
LDL Cholesterol (Calc): 107 mg/dL (calc) — ABNORMAL HIGH
Non-HDL Cholesterol (Calc): 130 mg/dL (calc) — ABNORMAL HIGH (ref <130)
Total CHOL/HDL Ratio: 2.8 (calc) (ref <5.0)
Triglycerides: 124 mg/dL (ref <150)

## 2019-11-11 MED ORDER — CLOTRIMAZOLE 1 % EX SOLN: 1.0000 "application " | Freq: Two times a day (BID) | CUTANEOUS | 0 refills | Status: DC

## 2019-11-11 NOTE — Telephone Encounter (Signed)
Samantha Cross would rather have ear drops. She states she can not afford to go to the ENT. She states if the drops don't help she will go to ENT.

## 2019-11-11 NOTE — Telephone Encounter (Signed)
OK sent over rx for antifungal ear solution.  We can recheck her ear in 2 weeks to see if has cleared.

## 2019-11-12 ENCOUNTER — Encounter: Payer: Self-pay | Admitting: Family Medicine

## 2019-11-12 NOTE — Telephone Encounter (Signed)
Patient advised, she will get ear drops and call back for follow up.   Patient also states she is ok with increasing simvastatin. Wants to receive a call about what increased dose she will get before anything is sent in to the pharmacy. Currently on 20 mg QD

## 2019-11-12 NOTE — Telephone Encounter (Signed)
I would like to change her to the 40mg  dose.  That is the next strength. Or we can change her to Crestor 20mg  which is a litt more powerful than pravastatin.

## 2019-11-13 MED ORDER — SIMVASTATIN 40 MG PO TABS: 40.0000 mg | ORAL_TABLET | Freq: Every day | ORAL | 3 refills | Status: DC

## 2019-11-13 NOTE — Telephone Encounter (Signed)
New rx sent

## 2019-11-13 NOTE — Telephone Encounter (Signed)
Patient advised and for clarification, she IS on Simvastatin

## 2019-11-13 NOTE — Telephone Encounter (Signed)
She would like to stay on the pravastatin.

## 2019-11-17 ENCOUNTER — Encounter: Payer: Self-pay | Admitting: *Deleted

## 2019-12-02 ENCOUNTER — Telehealth: Payer: Self-pay

## 2019-12-02 NOTE — Telephone Encounter (Signed)
I called the patient today about her upcoming follow-up appointment in radiation oncology.   Given the state of the COVID-19 pandemic, concerning case numbers in our community, and guidance from [facility], I offered a phone assessment with the patient to determine if coming to the clinic was necessary. She accepted.  I let the patient know that I had spoken with Dr. Isidore Moos, and she wanted them to know the importance of washing their hands for at least 20 seconds at a time, especially after going out in public, and before they eat.  Limit going out in public whenever possible. Do not touch your face, unless your hands are clean, such as when bathing. Get plenty of rest, eat well, and stay hydrated. Patient verbalized understanding and agreement.  The patient denies any symptomatic concerns. She denies any lingering fatigue, swelling to chest/axilla, or issues with range of motion to the right arm. Specifically, she reports good healing of her skin in the radiation fields.  Skin is intact and "looking almost normal". I recommended that she continue skin care by applying oil or lotion with vitamin E to the skin in the radiation fields, BID, for 2 more months.    Continue follow-up with medical oncology - follow-up is scheduled on 05/02/2020 with Dr. [name].  I explained that yearly mammograms are important for patients with intact breast tissue, and physical exams are important after mastectomy for patients that cannot undergo mammography. Patient verbalized understanding and agreement.  I encouraged her to call if she had further questions or concerns about her healing. Otherwise, she will follow-up PRN in radiation oncology. Patient is pleased with this plan, and we will cancel her upcoming follow-up to reduce the risk of COVID-19 transmission.

## 2019-12-04 ENCOUNTER — Ambulatory Visit: Payer: Medicare Other | Admitting: Radiation Oncology

## 2019-12-20 NOTE — Progress Notes (Signed)
  Patient Name: Samantha Cross MRN: [id number] DOB: 1945/08/17 Referring Physician: Beatrice Lecher (Profile Not Attached) Date of Service: 10/30/2019 [facility], [place]                                                        End Of Treatment Note  Diagnoses: C50.411-Malignant neoplasm of upper-outer quadrant of right female breast  Cancer Staging: Cancer Staging Malignant neoplasm of upper-outer quadrant of right breast in female, estrogen receptor positive (DISH) Staging form: Breast, AJCC 8th Edition - Clinical: Stage IIIA (cT3(m), cN0, cM0, G3, ER+, PR-, HER2-) - Signed by [name], MD on 05/12/2018 - Pathologic stage from 01/14/2019: No Stage Recommended (ypT2, pN1a, cM0, G2, ER+, PR-, HER2-) - Signed by [name], MD on 01/14/2019  Intent: Curative  Radiation Treatment Dates: 09/21/2019 through 10/30/2019 Site Technique Total Dose (Gy) Dose per Fx (Gy) Completed Fx Beam Energies  Chest Wall, Right: CW_Rt_IM_LN 3D 50/50 2 25/25 10X  Chest Wall, Right: CW_Rt_SCV_PAB 3D 50/50 2 25/25 6X, 10X  Chest Wall, Right: CW_Rt_Bst Electron 10/10 2 5/5 6E   Narrative: The patient tolerated radiation therapy relatively well.   Plan: The patient will follow-up with radiation oncology in 10mo -----------------------------------  SEppie Gibson MD

## 2020-04-12 DIAGNOSIS — C50411 Malignant neoplasm of upper-outer quadrant of right female breast: Secondary | ICD-10-CM | POA: Diagnosis not present

## 2020-04-12 DIAGNOSIS — Z170 Estrogen receptor positive status [ER+]: Secondary | ICD-10-CM | POA: Diagnosis not present

## 2020-05-01 NOTE — Progress Notes (Signed)
What does not mean like  Patient Care Team: Metheney, Catherine D, MD as PCP - General (Family Medicine) Martini, Keisha N, RN as Oncology Nurse Navigator Stuart, Dawn C, RN as Oncology Nurse Navigator  DIAGNOSIS:    ICD-10-CM   1. Malignant neoplasm of upper-outer quadrant of right breast in female, estrogen receptor positive (HCC)  C50.411    Z17.0     SUMMARY OF ONCOLOGIC HISTORY: Oncology History  Malignant neoplasm of upper-outer quadrant of right breast in female, estrogen receptor positive (HCC)  04/23/2018 Initial Diagnosis   Palpable lump in right breast UOQ, mammogram revealed 2 separate masses.  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  The Multi-Gene Panel offered by [facility] includes sequencing and/or deletion duplication testing of the following 85 genes: AIP, ALK, APC, ATM, AXIN2,BAP1,  BARD1, BLM, BMPR1A, BRCA1, BRCA2, BRIP1, CASR, CDC73, CDH1, CDK4, CDKN1B, CDKN1C, CDKN2A (p14ARF), CDKN2A (p16INK4a), CEBPA, CHEK2, CTNNA1, DICER1, DIS3L2, EGFR (c.2369C>T, p.Thr790Met variant only), EPCAM (Deletion/duplication testing only), FH, FLCN, GATA2, GPC3, GREM1 (Promoter region deletion/duplication testing only), HOXB13 (c.251G>A, p.Gly84Glu), HRAS, KIT, MAX, MEN1, MET, MITF (c.952G>A, p.Glu318Lys variant only), MLH1, MSH2, MSH3, MSH6, MUTYH, NBN, NF1, NF2, NTHL1, PALB2, PDGFRA, PHOX2B, PMS2, POLD1, POLE,  POT1, PRKAR1A, PTCH1, PTEN, RAD50, RAD51C, RAD51D, RB1, RECQL4, RET, RNF43, RUNX1, SDHAF2, SDHA (sequence changes only), SDHB, SDHC, SDHD, SMAD4, SMARCA4, SMARCB1, SMARCE1, STK11, SUFU, TERC, TERT, TMEM127, TP53, TSC1, TSC2, VHL, WRN and WT1.     12/24/2018 Surgery   Right lumpectomy ([facility]): residual invasive carcinoma, clear margins, and 2/3 lymph nodes positive for carcinoma.    01/14/2019 Cancer Staging   Staging form: Breast, AJCC 8th Edition - Pathologic stage from 01/14/2019: No Stage Recommended (ypT2, pN1a, cM0, G2, ER+, PR-, HER2-) - Signed by Squire, Sarah, MD on 01/14/2019   01/22/2019 Miscellaneous   MammaPrint: High risk luminal type B   03/11/2019 Surgery   Bilateral mastectomies ([facility]):   Left breast: benign tissue and two negative lymph nodes Right breast: residual IDC 0.5 cm, clear margins, 2 lymph nodes negativeER 95%, PR 0%, HER-2 -1+, Ki-67 10%   04/14/2019 -  Chemotherapy   The patient had dexamethasone (DECADRON) 4 MG tablet, 1 of 1 cycle, Start date: 03/23/2019, End date: 08/11/2019 DOXOrubicin (ADRIAMYCIN) chemo injection 102 mg, 60 mg/m2 = 102 mg, Intravenous,  Once, 4 of 4 cycles Dose modification: 50 mg/m2 (original dose 60 mg/m2, Cycle 2, Reason: Dose not tolerated) Administration: 102 mg (04/14/2019), 84 mg (04/28/2019), 84 mg (05/12/2019), 84 mg (05/26/2019) palonosetron (ALOXI) injection 0.25 mg, 0.25 mg, Intravenous,  Once, 4 of 4 cycles Administration: 0.25 mg (04/14/2019), 0.25 mg (04/28/2019), 0.25 mg (05/12/2019), 0.25 mg (05/26/2019) pegfilgrastim-jmdb (FULPHILA) injection 6 mg, 6 mg, Subcutaneous,  Once, 4 of 4 cycles Administration: 6 mg (04/16/2019), 6 mg (04/30/2019), 6 mg (05/14/2019), 6 mg (05/28/2019) cyclophosphamide (CYTOXAN) 1,020 mg in sodium chloride 0.9 % 250 mL chemo infusion, 600 mg/m2 = 1,020 mg,   Intravenous,  Once, 4 of 4 cycles Dose modification: 500 mg/m2 (original dose 600 mg/m2, Cycle 2, Reason: Dose not tolerated) Administration: 1,020 mg  (04/14/2019), 840 mg (04/28/2019), 840 mg (05/12/2019), 840 mg (05/26/2019) PACLitaxel (TAXOL) 138 mg in sodium chloride 0.9 % 250 mL chemo infusion (</= 80mg/m2), 80 mg/m2 = 138 mg, Intravenous,  Once, 12 of 12 cycles Dose modification: 65 mg/m2 (original dose 80 mg/m2, Cycle 7, Reason: Dose not tolerated, Comment: neuropathy) Administration: 138 mg (06/09/2019), 108 mg (06/16/2019), 108 mg (06/23/2019), 108 mg (06/30/2019), 108 mg (07/07/2019), 108 mg (07/14/2019), 108 mg (07/21/2019), 108 mg (07/28/2019), 108 mg (08/04/2019), 108 mg (08/11/2019), 108 mg (08/18/2019), 108 mg (08/25/2019) fosaprepitant (EMEND) 150 mg, dexamethasone (DECADRON) 12 mg in sodium chloride 0.9 % 145 mL IVPB, , Intravenous,  Once, 4 of 4 cycles Administration:  (04/14/2019),  (04/28/2019),  (05/12/2019),  (05/26/2019)  for chemotherapy treatment.    09/22/2019 - 10/30/2019 Radiation Therapy   Adjuvant radiation therapy     CHIEF COMPLIANT: Follow-up of right breast cancer on Letrozole  INTERVAL HISTORY: Samantha Cross is a 74 y.o. with above-mentioned history of right breast cancer treated with neoadjuvant letrozole, lumpectomy followed by bilateral mastectomies, adjuvant chemotherapy, radiation, and is currently on antiestrogen therapy with Letrozole.She presents to the clinic today for follow-up.  She is contemplating on getting plastic surgery done for her breast.  ALLERGIES:  is allergic to lisinopril, alendronate [alendronate], and hydroxyzine.  MEDICATIONS:  Current Outpatient Medications  Medication Sig Dispense Refill  . amLODipine (NORVASC) 10 MG tablet Take 1 tablet (10 mg total) by mouth daily. 90 tablet 2  . CLOBETASOL PROPIONATE E 0.05 % emollient cream APPLY EXTERNALLY TO THE AFFECTED AREA TWICE DAILY (Patient taking differently: Apply 1 application topically 2 (two) times daily as needed (rash). ) 45 g prn  . clotrimazole (LOTRIMIN) 1 % external solution Apply 1 application topically 2 (two) times daily. X 10  days to inner ear. 10 mL 0  . lidocaine-prilocaine (EMLA) cream Apply to affected area once 30 g 3  . Multiple Vitamins-Minerals (HAIR SKIN AND NAILS FORMULA) TABS Take by mouth.    . OVER THE COUNTER MEDICATION Take 2 capsules by mouth 2 (two) times daily as needed (Supplement). [employer]    . simvastatin (ZOCOR) 40 MG tablet Take 1 tablet (40 mg total) by mouth at bedtime. 90 tablet 3   No current facility-administered medications for this visit.    PHYSICAL EXAMINATION: ECOG PERFORMANCE STATUS: 1 - Symptomatic but completely ambulatory  Vitals:   05/02/20 1040  BP: 134/72  Pulse: 69  Resp: 18  Temp: (!) 97.4 F (36.3 C)  SpO2: 100%   Filed Weights   05/02/20 1040  Weight: 137 lb 1.6 oz (62.2 kg)    BREAST: Bilateral mastectomies. (exam performed in the presence of a chaperone)  LABORATORY DATA:  I have reviewed the data as listed CMP Latest Ref Rng & Units 08/25/2019 08/18/2019 08/11/2019  Glucose 70 - 99 mg/dL 106(H) 105(H) 91  BUN 8 - 23 mg/dL 9 7(L) 8  Creatinine 0.44 - 1.00 mg/dL 0.73 0.66 0.62  Sodium 135 - 145 mmol/L 143 142 141  Potassium 3.5 - 5.1 mmol/L 3.4(L) 3.6 3.5  Chloride 98 - 111 mmol/L 108 109 108  CO2 22 - 32 mmol/L 26 24 24  Calcium 8.9 - 10.3 mg/dL 8.9 8.9 8.7(L)  Total Protein 6.5 - 8.1 g/dL 6.4(L) 6.5 6.4(L)  Total Bilirubin 0.3 - 1.2 mg/dL 0.6 0.6 0.6  Alkaline Phos   38 - 126 U/L 67 65 64  AST 15 - 41 U/L 14(L) 13(L) 13(L)  ALT 0 - 44 U/L _0 Lab Results  Component Value Date   WBC 6.0 08/25/2019   HGB 12.4 08/25/2019   HCT 37.7 08/25/2019   MCV 98.2 08/25/2019   PLT 264 08/25/2019   NEUTROABS 4.1 08/25/2019    ASSESSMENT & PLAN:  Malignant neoplasm of upper-outer quadrant of right breast in female, estrogen receptor positive ([place]) 04/23/2018 palpable lump in right breast UOQ, mammogram revealed 2 separate masses. Larger at 12 o'clock position 5.3 cm and smaller at 10 o'clock position 2 cm, biopsy revealed invasive lobular  carcinoma grade 2-3 with LCIS intermediate grade, ER 95%, PR 0%, Ki-67 10%, HER-2 negative IHC 1+ T3 N0 stage 3A  MRI breast 05/19/2018: 5.7 x 4.9 x 3.3 cm mass central right breast, no lymph nodes 05/22/2018: CT CAP: Right renal lesion 06/06/2017: MRI abdomen: Complex cystic lesion right kidney, probably benign but requires follow-up in 6 months  Right lumpectomy: Residual invasive ductal carcinoma 3.8 cm, margins negative, 2/3 lymph nodes positive, ER 95%, PR 0%, HER-2 negative, Ki-67 10%  Treatment plan: 1.Neoadjuvant antiestrogen therapy12/02/2018-July 2020 3.12/24/2018 Right lumpectomy: Residual invasive ductal carcinoma 3.8 cm, margins negative, 2/3 lymph nodes positive, ER 95%, PR 0%, HER-2 negative, Ki-67 10%  03/11/2019 bilateral mastectomies [name]):  Left breast: benign tissue and two negative lymph nodes Right breast: residual IDC0.5 cm , clear margins, 2 lymph nodes negative, ER 95%, PR 0%, HER-2 -1+, Ki-67 10%  4.MammaPrint high risk: Adjuvant chemotherapy with dose dense Adriamycin and Cytoxan followed by Taxolcompleted 08/25/2019 5.adjuvant radiation therapy 09/22/19-10/28/19 5.Followed by adjuvant antiestrogen therapy with letrozole ----------------------------------------------------------------------------------------------------------------------------------------------- clinical trialSWOG 0350 ---------------------------------------------------------------------------------------------------------------------------------- Letrozole Toxicities: Tolerating it extremely well. Hip discomfort: Encouraged her to do some stretching  Breast Cancer Surveillance: 1. Breast exam 05/02/20: Normal 2. Mammogram she had bilateral mastectomies therefore there is no role of mammograms  She is contemplating on getting plastic surgery done for breast reconstruction. RTC in 1 year     No orders of the defined types were placed in this encounter.  The patient has a good  understanding of the overall plan. she agrees with it. she will call with any problems that may develop before the next visit here.  Total time spent: 20 mins including face to face time and time spent for planning, charting and coordination of care  [name], MD 05/02/2020  I, Cloyde Reams Dorshimer, am acting as scribe for Dr. [name].  I have reviewed the above documentation for accuracy and completeness, and I agree with the above.

## 2020-05-01 NOTE — Assessment & Plan Note (Signed)
04/23/2018 palpable lump in right breast UOQ, mammogram revealed 2 separate masses. 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, margins negative, 2/3 lymph nodes positive, ER 95%, PR 0%, HER-2 negative, Ki-67 10%  Treatment plan: 1.Neoadjuvant antiestrogen therapy12/02/2018-July 2020 3.12/24/2018 Right lumpectomy: Residual invasive ductal carcinoma 3.8 cm, margins negative, 2/3 lymph nodes positive, ER 95%, PR 0%, HER-2 negative, Ki-67 10%  03/11/2019 bilateral mastectomies [name]):  Left breast: benign tissue and two negative lymph nodes Right breast: residual IDC0.5 cm , clear margins, 2 lymph nodes negative, ER 95%, PR 0%, HER-2 -1+, Ki-67 10%  4.MammaPrint high risk: Adjuvant chemotherapy with dose dense Adriamycin and Cytoxan followed by Taxolcompleted 08/25/2019 5.adjuvant radiation therapy 09/22/19-10/28/19 5.Followed by adjuvant antiestrogen therapy ----------------------------------------------------------------------------------------------------------------------------------------------- clinical trialSWOG 9558 ---------------------------------------------------------------------------------------------------------------------------------- Anastrozole Toxicities:  Breast Cancer Surveillance: 1. Breast exam 05/02/20: Normal 2. Mammogram  No abnormalities. Postsurgical changes. Breast Density Category . I recommended that she get 3-D mammograms for surveillance. Discussed the differences between  different breast density categories.  RTC in 1 year

## 2020-05-02 ENCOUNTER — Other Ambulatory Visit: Payer: Self-pay

## 2020-05-02 ENCOUNTER — Inpatient Hospital Stay: Payer: Medicare Other | Attending: Hematology and Oncology | Admitting: Hematology and Oncology

## 2020-05-02 DIAGNOSIS — Z9221 Personal history of antineoplastic chemotherapy: Secondary | ICD-10-CM | POA: Insufficient documentation

## 2020-05-02 DIAGNOSIS — Z79811 Long term (current) use of aromatase inhibitors: Secondary | ICD-10-CM | POA: Insufficient documentation

## 2020-05-02 DIAGNOSIS — Z170 Estrogen receptor positive status [ER+]: Secondary | ICD-10-CM | POA: Insufficient documentation

## 2020-05-02 DIAGNOSIS — Z9013 Acquired absence of bilateral breasts and nipples: Secondary | ICD-10-CM | POA: Diagnosis not present

## 2020-05-02 DIAGNOSIS — Z923 Personal history of irradiation: Secondary | ICD-10-CM | POA: Insufficient documentation

## 2020-05-02 DIAGNOSIS — C50411 Malignant neoplasm of upper-outer quadrant of right female breast: Secondary | ICD-10-CM | POA: Diagnosis not present

## 2020-05-02 MED ORDER — LETROZOLE 2.5 MG PO TABS: 2.5000 mg | ORAL_TABLET | Freq: Every day | ORAL | 3 refills | Status: DC

## 2020-05-11 ENCOUNTER — Ambulatory Visit: Payer: Medicare Other | Admitting: Family Medicine

## 2020-05-17 ENCOUNTER — Ambulatory Visit (INDEPENDENT_AMBULATORY_CARE_PROVIDER_SITE_OTHER): Payer: Medicare Other | Admitting: Plastic Surgery

## 2020-05-17 ENCOUNTER — Other Ambulatory Visit: Payer: Self-pay

## 2020-05-17 ENCOUNTER — Encounter: Payer: Self-pay | Admitting: Plastic Surgery

## 2020-05-17 VITALS — BP 128/77 | HR 69 | Temp 97.9°F | Ht 64.0 in | Wt 134.4 lb

## 2020-05-17 DIAGNOSIS — Z170 Estrogen receptor positive status [ER+]: Secondary | ICD-10-CM | POA: Diagnosis not present

## 2020-05-17 DIAGNOSIS — Z9010 Acquired absence of unspecified breast and nipple: Secondary | ICD-10-CM | POA: Insufficient documentation

## 2020-05-17 DIAGNOSIS — M810 Age-related osteoporosis without current pathological fracture: Secondary | ICD-10-CM

## 2020-05-17 DIAGNOSIS — D1801 Hemangioma of skin and subcutaneous tissue: Secondary | ICD-10-CM | POA: Diagnosis not present

## 2020-05-17 DIAGNOSIS — Z9013 Acquired absence of bilateral breasts and nipples: Secondary | ICD-10-CM

## 2020-05-17 DIAGNOSIS — C50411 Malignant neoplasm of upper-outer quadrant of right female breast: Secondary | ICD-10-CM

## 2020-05-17 NOTE — Progress Notes (Signed)
Patient ID: Samantha Cross, female    DOB: 1945/09/04, 74 y.o.   MRN: [id number]   Chief Complaint  Patient presents with  . Advice Only  . Breast Problem    The patient is a 74 year old female who underwent bilateral mastectomies in October 2020.  She had right-sided breast cancer that was estrogen positive and progesterone negative.  She had chemotherapy and radiation which ended in December 2020.  Her last mammogram was March 2020.  She is 5 feet 4 inches tall and weighs 134 pounds.  She did not want to do reconstruction at the time but is interested in it now.  She feels like it is a part of being a woman which is understandable.  She has tight skin on the right and loose skin on the left due to the radiation.  Her skin has healed well and there is no sign of seroma or hematoma.  There is no sign of infection.  She has good range of motion of her upper extremities.  She has several medical conditions as listed below including osteoarthritis.  She has had several surgeries including the mastectomies, hysterectomy and cleft palate repair.  She has what appears to be an angioma on her upper right lip with purple discoloration.  It is approximately 5 mm in size.  She says its been there for couple years but seems to be getting larger.   Review of Systems  Constitutional: Negative.  Negative for activity change.  HENT: Negative.   Eyes: Negative.   Respiratory: Negative for chest tightness and shortness of breath.   Cardiovascular: Negative for leg swelling.  Gastrointestinal: Negative for abdominal distention.  Endocrine: Negative.   Genitourinary: Negative.   Musculoskeletal: Negative.   Hematological: Negative.   Psychiatric/Behavioral: Negative.     Past Medical History:  Diagnosis Date  . Arthritis   . Diverticulosis   . Family history of brain cancer   . Family history of breast cancer   . Family history of colon cancer   . Family history of melanoma   . Family  history of ovarian cancer   . Hemorrhoids   . Hyperlipidemia   . Hypertension   . Postmenopausal   . rt breast ca dx'd 05/2018    Past Surgical History:  Procedure Laterality Date  . ABDOMINAL HYSTERECTOMY     without BSO/heavy periods  . BLADDER SURGERY  when she was in her 30's   Her bladder was punctured during her hysterectomy and needed to be repaired.   Marland Kitchen BREAST LUMPECTOMY WITH RADIOACTIVE SEED AND SENTINEL LYMPH NODE BIOPSY Right 12/24/2018   Procedure: RIGHT BREAST LUMPECTOMY WITH RADIOACTIVE SEED AND SENTINEL LYMPH NODE BIOPSY;  Surgeon: Jovita Kussmaul, MD;  Location: [facility];  Service: General;  Laterality: Right;  . CLEFT PALATE REPAIR    . MIDDLE EAR SURGERY     left  . PORT-A-CATH REMOVAL Left 11/06/2019   Procedure: REMOVAL OF PORT;  Surgeon: Jovita Kussmaul, MD;  Location: [facility];  Service: General;  Laterality: Left;  . PORTACATH PLACEMENT Left 03/11/2019   Procedure: INSERTION PORT-A-CATH POSSILBLE ULTRASOUND;  Surgeon: Jovita Kussmaul, MD;  Location: [facility];  Service: General;  Laterality: Left;  . TOTAL MASTECTOMY Bilateral 03/11/2019   Procedure: BILATERAL  MASTECTOMIES;  Surgeon: Jovita Kussmaul, MD;  Location: [facility];  Service: General;  Laterality: Bilateral;      Current Outpatient Medications:  .  amLODipine (NORVASC) 10 MG tablet,  Take 1 tablet (10 mg total) by mouth daily., Disp: 90 tablet, Rfl: 2 .  CLOBETASOL PROPIONATE E 0.05 % emollient cream, APPLY EXTERNALLY TO THE AFFECTED AREA TWICE DAILY (Patient taking differently: Apply 1 application topically 2 (two) times daily as needed (rash).), Disp: 45 g, Rfl: prn .  clotrimazole (LOTRIMIN) 1 % external solution, Apply 1 application topically 2 (two) times daily. X 10 days to inner ear., Disp: 10 mL, Rfl: 0 .  letrozole (FEMARA) 2.5 MG tablet, Take 1 tablet (2.5 mg total) by mouth daily., Disp: 90 tablet, Rfl: 3 .  lidocaine-prilocaine (EMLA) cream, Apply to affected area once,  Disp: 30 g, Rfl: 3 .  Multiple Vitamins-Minerals (HAIR SKIN AND NAILS FORMULA) TABS, Take by mouth., Disp: , Rfl:  .  OVER THE COUNTER MEDICATION, Take 2 capsules by mouth 2 (two) times daily as needed (Supplement). [employer], Disp: , Rfl:  .  simvastatin (ZOCOR) 40 MG tablet, Take 1 tablet (40 mg total) by mouth at bedtime., Disp: 90 tablet, Rfl: 3   Objective:   Vitals:   05/17/20 0915  BP: 128/77  Pulse: 69  Temp: 97.9 F (36.6 C)  SpO2: 100%    Physical Exam Vitals and nursing note reviewed.  Constitutional:      Appearance: Normal appearance.  HENT:     Head: Normocephalic and atraumatic.  Cardiovascular:     Rate and Rhythm: Normal rate.     Pulses: Normal pulses.  Pulmonary:     Effort: Pulmonary effort is normal. No respiratory distress.  Abdominal:     General: Abdomen is flat. There is no distension.  Neurological:     General: No focal deficit present.     Mental Status: She is alert and oriented to person, place, and time.  Psychiatric:        Mood and Affect: Mood normal.        Behavior: Behavior normal.        Thought Content: Thought content normal.     Assessment & Plan:  Malignant neoplasm of upper-outer quadrant of right breast in female, estrogen receptor positive ([place])  Osteoporosis, unspecified osteoporosis type, unspecified pathological fracture presence  Acquired absence of both breasts  Angioma of skin  We had a long discussion about options for reconstruction including autologous and implant-based reconstruction.  Due to her radiation autologous reconstruction would be her best option on the right side.  She could do a latissimus with an implant.  At her age I do not suggest the surgery.  She came to that conclusion on her own and I agree with her.  I gave her a prescription for [facility] for prosthetics.  I also offered to try and laser the angioma on her lip.  If that does not take it away she could have it excised.  The  patient is pleased with that plan.  She was very thankful for the visit and the time discussing her breast reconstruction options.  She will not pursue any reconstruction at this time.   Pictures were obtained of the patient and placed in the chart with the patient's or guardian's permission.   [place], DO

## 2020-05-25 ENCOUNTER — Telehealth: Payer: Self-pay | Admitting: *Deleted

## 2020-05-25 NOTE — Telephone Encounter (Signed)
Faxed referral on (05/18/20) and recent office notes to [facility] for Mastectomy Supplies for the patient.  Confirmation received and copy scanned into the chart.//AB/CMA

## 2020-07-12 ENCOUNTER — Encounter: Payer: Self-pay | Admitting: Plastic Surgery

## 2020-07-12 ENCOUNTER — Ambulatory Visit (INDEPENDENT_AMBULATORY_CARE_PROVIDER_SITE_OTHER): Payer: Self-pay | Admitting: Plastic Surgery

## 2020-07-12 ENCOUNTER — Other Ambulatory Visit: Payer: Self-pay

## 2020-07-12 VITALS — BP 130/75 | HR 60

## 2020-07-12 DIAGNOSIS — Z719 Counseling, unspecified: Secondary | ICD-10-CM

## 2020-07-12 NOTE — Progress Notes (Signed)
Preoperative Dx: Right upper lip angioma  Postoperative Dx:  same  Procedure: laser to right upper lip angioma  Anesthesia: none  Description of Procedure:  Risks and complications were explained to the patient. Consent was confirmed and signed. Time out was called and all information was confirmed to be correct. The area  area was prepped with alcohol and wiped dry. The MD YAG laser was set. The upper lip angioma was lasered. The patient tolerated the procedure well and there were no complications. The patient is to follow up in 3-4 weeks.

## 2020-07-17 IMAGING — MR MR BILATERAL BREAST WITHOUT AND WITH CONTRAST
8 of 12 series · 32 of 48 positions shown · IV contrast (7ml gadavist)
Comparison: Bilateral breast MRI May 19, 2018 and prior
mammograms

CLINICAL DATA: 72-year-old patient has undergone neoadjuvant
chemotherapy for right breast cancer. Evaluate response. A mass 10
o'clock to 12 o'clock position of the right breast was biopsied in 2
areas, both demonstrating malignancy.

LABS:  Creatinine was obtained on site at [HOSPITAL] at [REDACTED] [HOSPITAL].
Results: Creatinine 0.8 mg/dL.
EXAM:
BILATERAL BREAST MRI WITH AND WITHOUT CONTRAST
TECHNIQUE: Multiplanar, multisequence MR images of both breasts were obtained
prior to and following the intravenous administration of 7 ml of
Gadavist

[Series 2: t2_tirm_tra ipat (a-p) · axial · 3.0mm · 0.70mm/px · 1 of 55 slices shown]
[im 1/55]
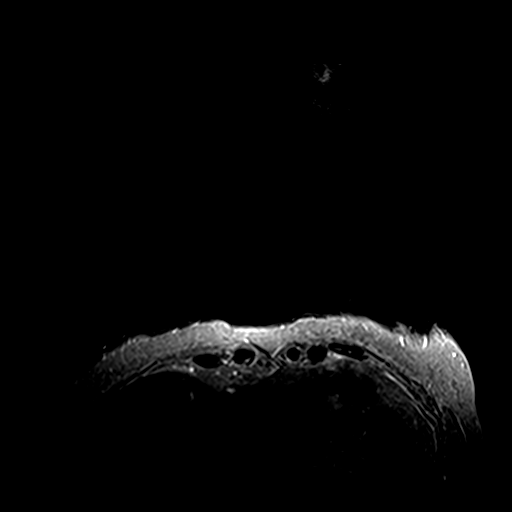

[Series 3: fl3d pre-cm no · axial · non-contrast · 1.2mm · 0.94mm/px · z∈[-54,+137]mm · 5 of 160 slices shown]
[im 1/160]
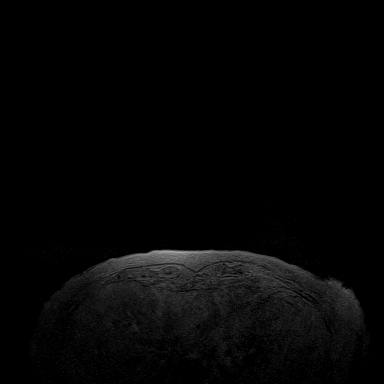
[im 40/160]
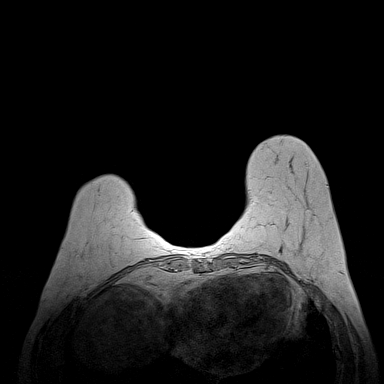
[im 80/160]
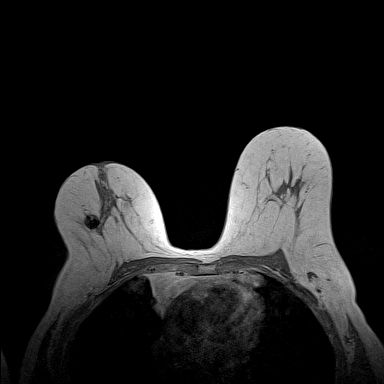
[im 120/160]
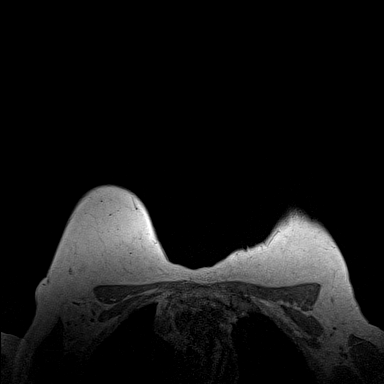
[im 160/160]
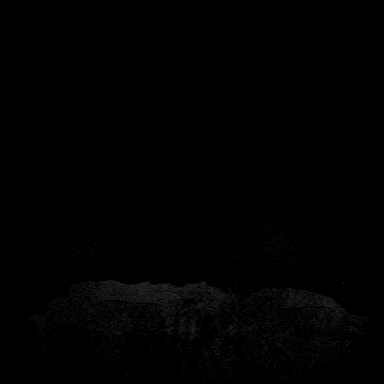

[Series 4: fl3d pre-cm · axial · non-contrast · 1.2mm · 0.94mm/px · z∈[-54,+137]mm · 5 of 160 slices shown]
[im 1/160]
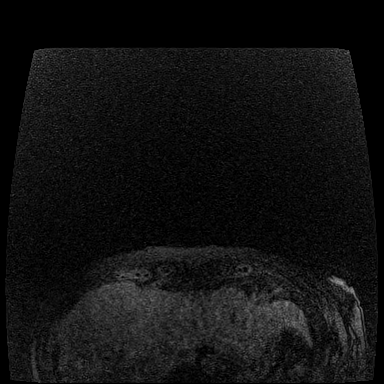
[im 40/160]
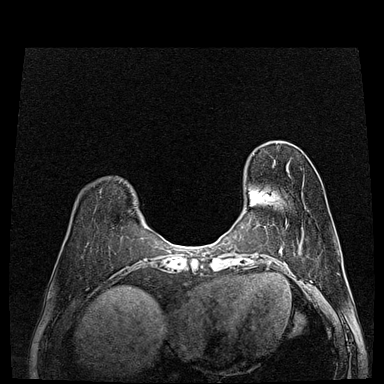
[im 80/160]
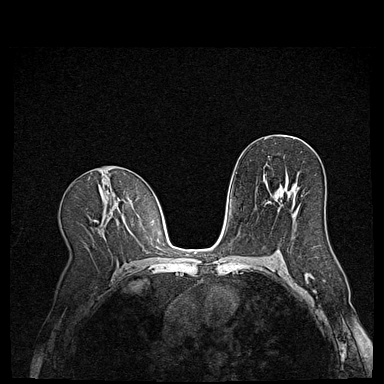
[im 120/160]
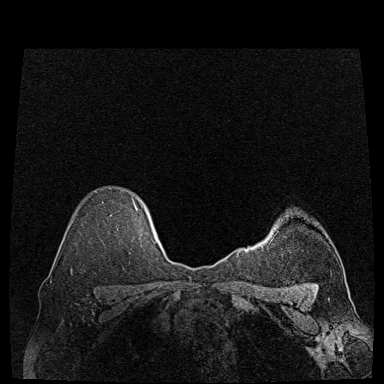
[im 160/160]
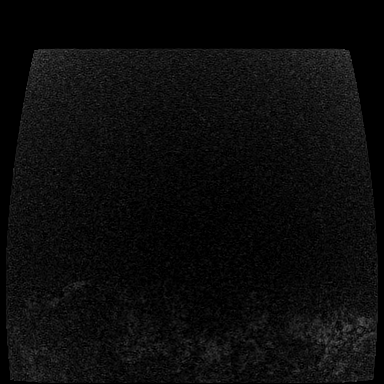

[Series 5: fl3d post-cm 20 · axial · 1.2mm · 0.94mm/px · z∈[-54,+137]mm · 5 of 160 slices shown (1 of 3)]
[im 1/160]
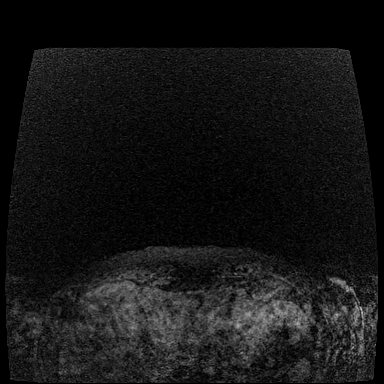
[im 40/160]
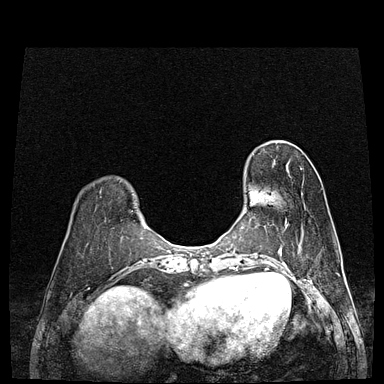
[im 80/160]
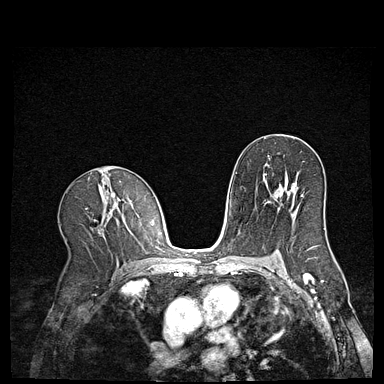
[im 120/160]
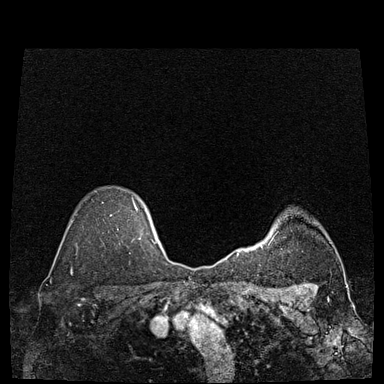
[im 160/160]
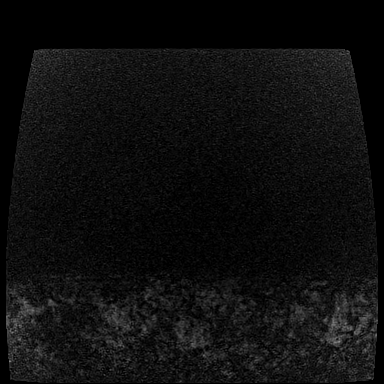

[Series 6: fl3d post-cm 20 · axial · 1.2mm · 0.94mm/px · z∈[-54,+137]mm · 5 of 160 slices shown (2 of 3)]
[im 1/160]
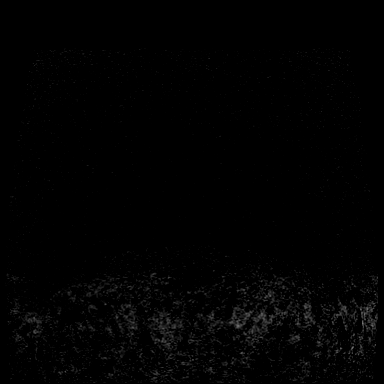
[im 40/160]
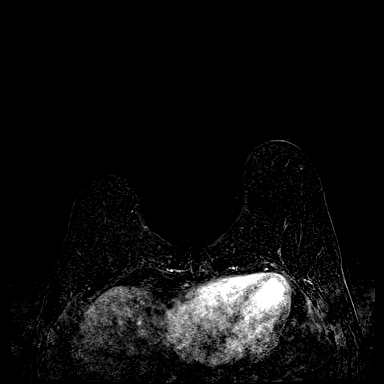
[im 80/160]
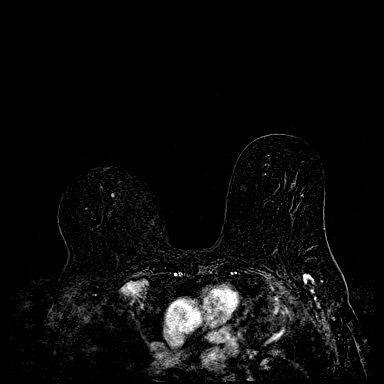
[im 120/160]
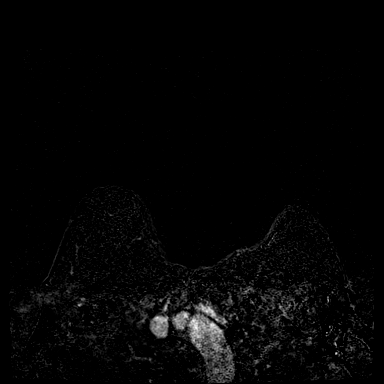
[im 160/160]
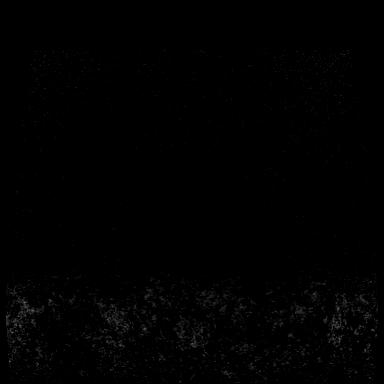

[Series 7: fl3d post-cm 20 · axial · 192.0mm · 0.94mm/px · 1 of 1 slices shown (3 of 3)]
[im 1/1]
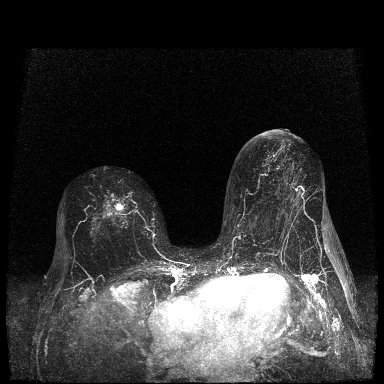

[Series 8: fl3d post-cm 3min · axial · 1.2mm · 0.94mm/px · z∈[-54,+137]mm · 6 of 160 slices shown]
[im 1/160]
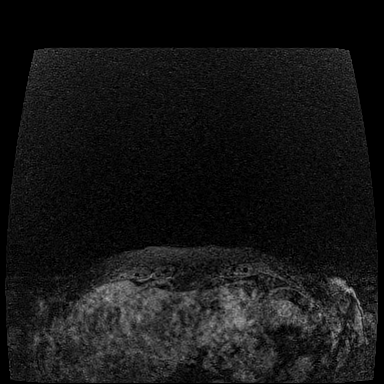
[im 32/160]
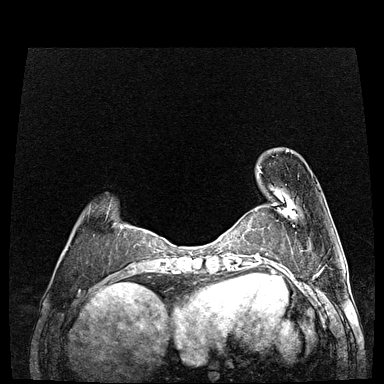
[im 64/160]
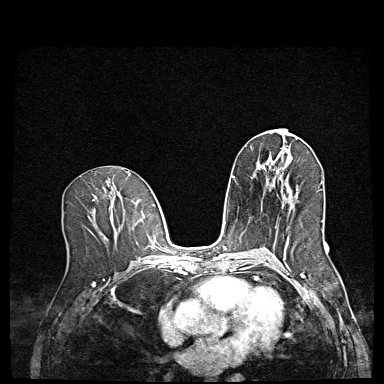
[im 96/160]
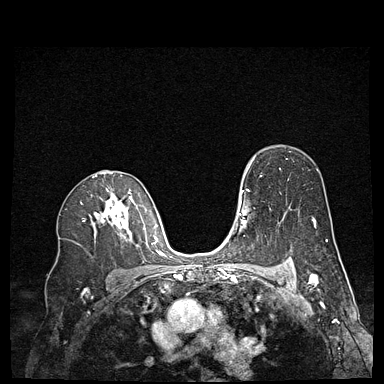
[im 128/160]
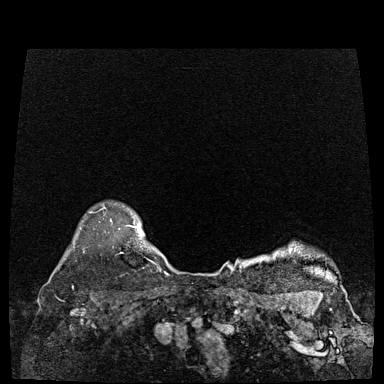
[im 160/160]
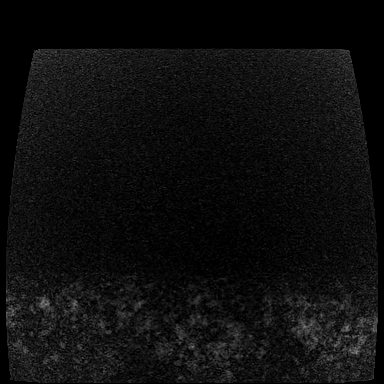

[Series 9: fl3d post-cm 3min_sub · axial · 1.2mm · 0.94mm/px · z∈[-54,+60]mm · 4 of 160 slices shown]
[im 1/160]
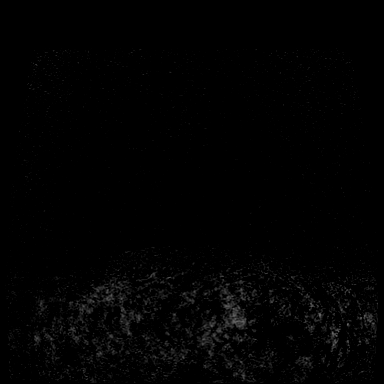
[im 32/160]
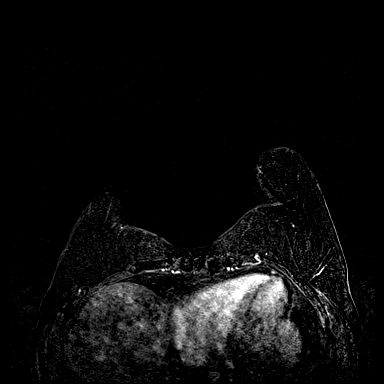
[im 64/160]
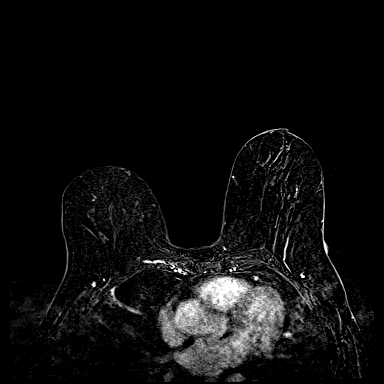
[im 96/160]
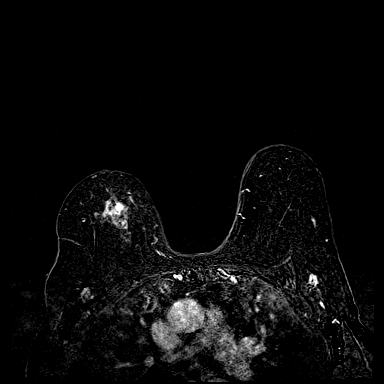

[32 of 48 positions shown; findings below may reference images not displayed]

Three-dimensional MR images were rendered by post-processing of the
original MR data on an independent workstation. The
three-dimensional MR images were interpreted, and findings are
reported in the following complete MRI report for this study. Three
dimensional images were evaluated at the independent [employer]
workstation
FINDINGS: Breast composition: b. Scattered fibroglandular tissue.

Background parenchymal enhancement: Mild

Right breast: The right breast is smaller than the left. There is an
intensely enhancing slightly irregular approximately 6 mm mass in
the upper central right breast, anterior to middle thirds. This
corresponds to the biopsy-proven malignancy. Surrounding this
intensely enhancing mass is nonmass enhancement that is greater than
the patient's background parenchymal enhancement. The surrounding
nonmass enhancement and focal enhancing mass measure in total
approximately 2.8 cm x 2.5 cm x 3.7 cm (AP x transverse x
craniocaudal). The enhancement involves both the upper inner and
upper outer quadrants. On prior breast MRI dated 05/19/2018, the
enhancing mass measured 5.7 x 4.9 x 3.3 cm. Additionally, the
intensity of enhancement in the right breast has decreased compared
to prior.

Negative for skin thickening.

Left breast: No mass or abnormal enhancement.

Lymph nodes: No abnormal appearing lymph nodes.

Ancillary findings:  Probable atelectasis in the right middle lobe.
IMPRESSION: Positive response neoadjuvant therapy of the right breast. Abnormal
enhancement at site of the biopsy-proven malignancy in the 10 to 12
o'clock region of the right breast has decreased in overall
intensity and now measures 2.8 x 2.5 x 3.7 cm. Abnormal enhancement
does involve the upper inner and upper outer quadrants.

The left breast is negative.

RECOMMENDATION:
Treatment planning.

BI-RADS CATEGORY  6: Known biopsy-proven malignancy.

## 2020-08-02 ENCOUNTER — Other Ambulatory Visit: Payer: Self-pay

## 2020-08-02 ENCOUNTER — Ambulatory Visit (INDEPENDENT_AMBULATORY_CARE_PROVIDER_SITE_OTHER): Payer: Medicare Other | Admitting: Plastic Surgery

## 2020-08-02 ENCOUNTER — Encounter: Payer: Self-pay | Admitting: Plastic Surgery

## 2020-08-02 VITALS — BP 133/76 | HR 63

## 2020-08-02 DIAGNOSIS — Z719 Counseling, unspecified: Secondary | ICD-10-CM

## 2020-08-02 NOTE — Progress Notes (Signed)
The patient is a 75 year old female here for follow-up after undergoing a laser treatment for her upper left angioma.  Patient is really pleased that has faded and is really not even noticeable today.  She knows to let us know if it comes back otherwise we are here if she needs [place].

## 2020-08-03 ENCOUNTER — Other Ambulatory Visit: Payer: Self-pay | Admitting: Family Medicine

## 2020-08-03 DIAGNOSIS — I10 Essential (primary) hypertension: Secondary | ICD-10-CM

## 2020-08-03 NOTE — Telephone Encounter (Signed)
Please call pt for f/u on bp, med refills and labs.

## 2020-08-03 NOTE — Telephone Encounter (Signed)
Patient scheduled for 08/17/20. AM

## 2020-08-17 ENCOUNTER — Ambulatory Visit (INDEPENDENT_AMBULATORY_CARE_PROVIDER_SITE_OTHER): Payer: Medicare Other | Admitting: Family Medicine

## 2020-08-17 ENCOUNTER — Other Ambulatory Visit: Payer: Self-pay

## 2020-08-17 ENCOUNTER — Encounter: Payer: Self-pay | Admitting: Family Medicine

## 2020-08-17 VITALS — BP 126/66 | HR 56 | Temp 98.1°F | Resp 20 | Ht 64.0 in | Wt 135.0 lb

## 2020-08-17 DIAGNOSIS — E7800 Pure hypercholesterolemia, unspecified: Secondary | ICD-10-CM

## 2020-08-17 DIAGNOSIS — I10 Essential (primary) hypertension: Secondary | ICD-10-CM

## 2020-08-17 DIAGNOSIS — H8113 Benign paroxysmal vertigo, bilateral: Secondary | ICD-10-CM | POA: Diagnosis not present

## 2020-08-17 DIAGNOSIS — M25552 Pain in left hip: Secondary | ICD-10-CM

## 2020-08-17 LAB — COMPLETE METABOLIC PANEL WITH GFR
AG Ratio: 1.6 (calc) (ref 1.0–2.5)
ALT: 15 U/L (ref 6–29)
AST: 18 U/L (ref 10–35)
Albumin: 4.5 g/dL (ref 3.6–5.1)
Alkaline phosphatase (APISO): 79 U/L (ref 37–153)
BUN: 11 mg/dL (ref 7–25)
CO2: 30 mmol/L (ref 20–32)
Calcium: 9.8 mg/dL (ref 8.6–10.4)
Chloride: 105 mmol/L (ref 98–110)
Creat: 0.64 mg/dL (ref 0.60–0.93)
GFR, Est African American: 103 mL/min/{1.73_m2} (ref >=60)
GFR, Est Non African American: 89 mL/min/{1.73_m2} (ref >=60)
Globulin: 2.8 g/dL (calc) (ref 1.9–3.7)
Glucose, Bld: 102 mg/dL — ABNORMAL HIGH (ref 65–99)
Potassium: 4 mmol/L (ref 3.5–5.3)
Sodium: 142 mmol/L (ref 135–146)
Total Bilirubin: 0.8 mg/dL (ref 0.2–1.2)
Total Protein: 7.3 g/dL (ref 6.1–8.1)

## 2020-08-17 LAB — LIPID PANEL
Cholesterol: 195 mg/dL (ref <200)
HDL: 84 mg/dL (ref >=50)
LDL Cholesterol (Calc): 92 mg/dL (calc)
Non-HDL Cholesterol (Calc): 111 mg/dL (calc) (ref <130)
Total CHOL/HDL Ratio: 2.3 (calc) (ref <5.0)
Triglycerides: 93 mg/dL (ref <150)

## 2020-08-17 LAB — CBC
HCT: 45.4 % — ABNORMAL HIGH (ref 35.0–45.0)
Hemoglobin: 15.2 g/dL (ref 11.7–15.5)
MCH: 30.5 pg (ref 27.0–33.0)
MCHC: 33.5 g/dL (ref 32.0–36.0)
MCV: 91.2 fL (ref 80.0–100.0)
MPV: 10.8 fL (ref 7.5–12.5)
Platelets: 264 10*3/uL (ref 140–400)
RBC: 4.98 10*6/uL (ref 3.80–5.10)
RDW: 12.5 % (ref 11.0–15.0)
WBC: 5.4 10*3/uL (ref 3.8–10.8)

## 2020-08-17 MED ORDER — ZOSTER VAC RECOMB ADJUVANTED 50 MCG/0.5ML IM SUSR
0.5000 mL | Freq: Once | INTRAMUSCULAR | 0 refills | Status: AC
Start: 2020-08-17 — End: 2020-08-17

## 2020-08-17 NOTE — Assessment & Plan Note (Signed)
Well controlled. Continue current regimen. Follow up in  6 mo  

## 2020-08-17 NOTE — Progress Notes (Signed)
Established Patient Office Visit  Subjective:  Patient ID: Samantha Cross, female    DOB: 1946-01-29  Age: 75 y.o. MRN: [id number]  CC:  Chief Complaint  Patient presents with  . Follow-up    HPI [name] presents for   Hypertension- Pt denies chest pain, SOB, dizziness, or heart palpitations.  Taking meds as directed w/o problems.  Denies medication side effects.   F/U hyperlipidemia - we in her simvastatin to 40mg  daily about 9 months ago.  She says has bee on 47m up until about a week ago as they kept filling the 20mg  dose.     She also c/o of dizziness as well its been going on for a few days.  She does notice it is worse with bending over and changing position.  She denies any injury or trauma but would like me to look in her ears today to make sure that she does not have an infection as she does get those frequently and sometimes those can make her dizzy.  That she is also had a history of BPPV.Marland Kitchen     She states her left hip is been bothering her again she saw Dr. Darene Lamer several years ago and ended up having an injection she thinks that that the point where it might need treatment again.  Past Medical History:  Diagnosis Date  . Arthritis   . Diverticulosis   . Family history of brain cancer   . Family history of breast cancer   . Family history of colon cancer   . Family history of melanoma   . Family history of ovarian cancer   . Hemorrhoids   . Hyperlipidemia   . Hypertension   . Postmenopausal   . rt breast ca dx'd 05/2018    Past Surgical History:  Procedure Laterality Date  . ABDOMINAL HYSTERECTOMY     without BSO/heavy periods  . BLADDER SURGERY  when she was in her 30's   Her bladder was punctured during her hysterectomy and needed to be repaired.   Marland Kitchen BREAST LUMPECTOMY WITH RADIOACTIVE SEED AND SENTINEL LYMPH NODE BIOPSY Right 12/24/2018   Procedure: RIGHT BREAST LUMPECTOMY WITH RADIOACTIVE SEED AND SENTINEL LYMPH NODE BIOPSY;   Surgeon: Jovita Kussmaul, MD;  Location: [facility];  Service: General;  Laterality: Right;  . CLEFT PALATE REPAIR    . MIDDLE EAR SURGERY     left  . PORT-A-CATH REMOVAL Left 11/06/2019   Procedure: REMOVAL OF PORT;  Surgeon: Jovita Kussmaul, MD;  Location: [place];  Service: General;  Laterality: Left;  . PORTACATH PLACEMENT Left 03/11/2019   Procedure: INSERTION PORT-A-CATH POSSILBLE ULTRASOUND;  Surgeon: Jovita Kussmaul, MD;  Location: [place];  Service: General;  Laterality: Left;  . TOTAL MASTECTOMY Bilateral 03/11/2019   Procedure: BILATERAL  MASTECTOMIES;  Surgeon: Jovita Kussmaul, MD;  Location: [facility] OR;  Service: General;  Laterality: Bilateral;    Family History  Problem Relation Age of Onset  . Ovarian cancer Mother        dx under 30; d. 84  . Colon cancer Mother 60  . Cancer Brother        brain tumor/gliobastoma, multiforme  . Melanoma Brother   . Kidney cancer Brother 25  . Cancer Brother        renal cell carcinoma, colon ca  . Heart attack Father 52       died/CVA  . Heart attack Brother 55  . Hyperlipidemia Other   .  Breast cancer Sister 25  . Cancer Maternal Aunt        NOS  . Colon cancer Maternal Uncle        dx in his 4s  . Colon cancer Maternal Grandmother        dx over 79    Social History   Socioeconomic History  . Marital status: Married    Spouse name: Not on file  . Number of children: Not on file  . Years of education: Not on file  . Highest education level: Not on file  Occupational History  . Occupation: has a farm.   Tobacco Use  . Smoking status: Former Smoker    Quit date: 06/04/1998    Years since quitting: 22.2  . Smokeless tobacco: Never Used  Vaping Use  . Vaping Use: Never used  Substance and Sexual Activity  . Alcohol use: Yes    Alcohol/week: 3.0 standard drinks    Types: 3 Glasses of wine per week    Comment: social  . Drug use: No  . Sexual activity: Not on file    Comment: married, 2 grown  children, 2 stepkids, no regular exercise, 3 beagles, working PT at daycare.  Other Topics Concern  . Not on file  Social History Narrative  . Not on file   Social Determinants of Health   Financial Resource Strain: Not on file  Food Insecurity: Not on file  Transportation Needs: Not on file  Physical Activity: Not on file  Stress: Not on file  Social Connections: Not on file  Intimate Partner Violence: Not on file    Outpatient Medications Prior to Visit  Medication Sig Dispense Refill  . amLODipine (NORVASC) 10 MG tablet TAKE 1 TABLET(10 MG) BY MOUTH DAILY 90 tablet 0  . CLOBETASOL PROPIONATE E 0.05 % emollient cream APPLY EXTERNALLY TO THE AFFECTED AREA TWICE DAILY (Patient taking differently: Apply 1 application topically 2 (two) times daily as needed (rash).) 45 g prn  . clotrimazole (LOTRIMIN) 1 % external solution Apply 1 application topically 2 (two) times daily. X 10 days to inner ear. 10 mL 0  . letrozole (FEMARA) 2.5 MG tablet Take 1 tablet (2.5 mg total) by mouth daily. 90 tablet 3  . lidocaine-prilocaine (EMLA) cream Apply to affected area once 30 g 3  . Multiple Vitamins-Minerals (HAIR SKIN AND NAILS FORMULA) TABS Take by mouth.    Marland Kitchen OVER THE COUNTER MEDICATION Take 2 capsules by mouth 2 (two) times daily as needed (Supplement). [employer]    . simvastatin (ZOCOR) 40 MG tablet Take 1 tablet (40 mg total) by mouth at bedtime. 90 tablet 3   No facility-administered medications prior to visit.    Allergies  Allergen Reactions  . Lisinopril     Tongue swelling  . Alendronate [Alendronate] Other (See Comments)    Chest pain, palpitations.   . Hydroxyzine Other (See Comments)    Made her feel weird    ROS Review of Systems    Objective:    Physical Exam Constitutional:      Appearance: She is well-developed.  HENT:     Head: Normocephalic and atraumatic.     Right Ear: Ear canal and external ear normal.     Left Ear: Ear canal and external ear  normal.     Ears:     Comments: TMS are abnormal but no signe of erythema, some slight distension on the left.   Cardiovascular:     Rate and Rhythm:  Normal rate and regular rhythm.     Heart sounds: Normal heart sounds.  Pulmonary:     Effort: Pulmonary effort is normal.     Breath sounds: Normal breath sounds.  Skin:    General: Skin is warm and dry.  Neurological:     Mental Status: She is alert and oriented to person, place, and time.  Psychiatric:        Behavior: Behavior normal.     BP 126/66 (BP Location: Left Arm, Patient Position: Sitting, Cuff Size: Normal)   Pulse (!) 56   Temp 98.1 F (36.7 C) (Oral)   Resp 20   Ht 5\' 4"  (1.626 m)   Wt 135 lb (61.2 kg)   SpO2 100%   BMI 23.17 kg/m  Wt Readings from Last 3 Encounters:  08/17/20 135 lb (61.2 kg)  05/17/20 134 lb 6.4 oz (61 kg)  05/02/20 137 lb 1.6 oz (62.2 kg)     There are no preventive care reminders to display for this patient.  There are no preventive care reminders to display for this patient.  No results found for: TSH Lab Results  Component Value Date   WBC 5.4 08/17/2020   HGB 15.2 08/17/2020   HCT 45.4 (H) 08/17/2020   MCV 91.2 08/17/2020   PLT 264 08/17/2020   Lab Results  Component Value Date   NA 143 08/25/2019   K 3.4 (L) 08/25/2019   CO2 26 08/25/2019   GLUCOSE 106 (H) 08/25/2019   BUN 9 08/25/2019   CREATININE 0.73 08/25/2019   BILITOT 0.6 08/25/2019   ALKPHOS 67 08/25/2019   AST 14 (L) 08/25/2019   ALT 14 08/25/2019   PROT 6.4 (L) 08/25/2019   ALBUMIN 3.7 08/25/2019   CALCIUM 8.9 08/25/2019   ANIONGAP 9 08/25/2019   Lab Results  Component Value Date   CHOL 203 (H) 11/10/2019   Lab Results  Component Value Date   HDL 73 11/10/2019   Lab Results  Component Value Date   LDLCALC 107 (H) 11/10/2019   Lab Results  Component Value Date   TRIG 124 11/10/2019   Lab Results  Component Value Date   CHOLHDL 2.8 11/10/2019   No results found for: HGBA1C     Assessment & Plan:   Problem List Items Addressed This Visit      Cardiovascular and Mediastinum   Essential hypertension, benign - Primary    Well controlled. Continue current regimen. Follow up in  6 mo       Relevant Orders   COMPLETE METABOLIC PANEL WITH GFR   Lipid panel   CBC (Completed)     Other   Hyperlipidemia    Due to recheck lipids on the higher dose of simvastatin.       Relevant Orders   COMPLETE METABOLIC PANEL WITH GFR   Lipid panel   CBC (Completed)    Other Visit Diagnoses    Left hip pain       Benign paroxysmal positional vertigo due to bilateral vestibular disorder          BPPV-discussed doing home therapy if not improving can refer her for vestibular rehab.  In the past she got better with a single treatment.  She would like to try the home ones first.  Left hip pain-encouraged her to get in with Dr. Dianah Field for further treatment.  Meds ordered this encounter  Medications  . Zoster Vaccine Adjuvanted [facility]) injection    Sig: Inject 0.5 mLs into the muscle  once for 1 dose. Repeat dose in 2-6 months.    Dispense:  0.5 mL    Refill:  0    Follow-up: Return in about 6 months (around 02/17/2021) for Hypertension.    Beatrice Lecher, MD

## 2020-08-17 NOTE — Assessment & Plan Note (Signed)
Due to recheck lipids on the higher dose of simvastatin.

## 2020-08-27 ENCOUNTER — Other Ambulatory Visit: Payer: Self-pay | Admitting: Hematology and Oncology

## 2020-10-05 DIAGNOSIS — Z1211 Encounter for screening for malignant neoplasm of colon: Secondary | ICD-10-CM | POA: Diagnosis not present

## 2020-10-05 DIAGNOSIS — Z86010 Personal history of colonic polyps: Secondary | ICD-10-CM | POA: Diagnosis not present

## 2020-10-05 DIAGNOSIS — K5730 Diverticulosis of large intestine without perforation or abscess without bleeding: Secondary | ICD-10-CM | POA: Diagnosis not present

## 2020-10-05 LAB — HM COLONOSCOPY

## 2020-10-28 ENCOUNTER — Other Ambulatory Visit: Payer: Self-pay | Admitting: Family Medicine

## 2020-10-28 ENCOUNTER — Other Ambulatory Visit: Payer: Self-pay | Admitting: *Deleted

## 2020-10-28 DIAGNOSIS — E7800 Pure hypercholesterolemia, unspecified: Secondary | ICD-10-CM

## 2020-10-28 MED ORDER — SIMVASTATIN 40 MG PO TABS: 40.0000 mg | ORAL_TABLET | Freq: Every day | ORAL | 3 refills | Status: DC

## 2020-11-08 ENCOUNTER — Encounter: Payer: Self-pay | Admitting: Family Medicine

## 2020-11-22 DIAGNOSIS — C50411 Malignant neoplasm of upper-outer quadrant of right female breast: Secondary | ICD-10-CM | POA: Diagnosis not present

## 2020-11-22 DIAGNOSIS — Z170 Estrogen receptor positive status [ER+]: Secondary | ICD-10-CM | POA: Diagnosis not present

## 2020-11-28 ENCOUNTER — Other Ambulatory Visit: Payer: Self-pay | Admitting: Family Medicine

## 2020-11-28 ENCOUNTER — Other Ambulatory Visit: Payer: Self-pay | Admitting: Hematology and Oncology

## 2020-11-28 DIAGNOSIS — I10 Essential (primary) hypertension: Secondary | ICD-10-CM

## 2020-11-30 ENCOUNTER — Other Ambulatory Visit: Payer: Self-pay | Admitting: Family Medicine

## 2020-11-30 DIAGNOSIS — I10 Essential (primary) hypertension: Secondary | ICD-10-CM

## 2020-12-01 ENCOUNTER — Other Ambulatory Visit: Payer: Self-pay

## 2020-12-01 DIAGNOSIS — I10 Essential (primary) hypertension: Secondary | ICD-10-CM

## 2020-12-01 MED ORDER — AMLODIPINE BESYLATE 10 MG PO TABS: 10.0000 mg | ORAL_TABLET | Freq: Every day | ORAL | 0 refills | Status: DC

## 2020-12-06 ENCOUNTER — Other Ambulatory Visit: Payer: Self-pay | Admitting: *Deleted

## 2020-12-06 ENCOUNTER — Telehealth: Payer: Self-pay

## 2020-12-06 NOTE — Telephone Encounter (Signed)
RN spoke with patient regarding + Covid test from 7/4.    Patient report symptoms including achy, headache, cough, and fever of 102.    Patient is currently utilizing Tylenol and fluids, and reports symptoms are manageable.  Pt reports being able to complete ADL's.  RN reviewed with MD - MD recommendations to continue with Tylenol and fluids for symptom management.   If symptoms develop into moderate, such as not being able to complete ADL's, we can re-evaluate.   RN notified patient, verbalized understanding and agreement.

## 2020-12-06 NOTE — Telephone Encounter (Signed)
RN returned call, voicemail left for call back.  

## 2020-12-07 ENCOUNTER — Encounter: Payer: Self-pay | Admitting: Family Medicine

## 2020-12-07 ENCOUNTER — Telehealth (INDEPENDENT_AMBULATORY_CARE_PROVIDER_SITE_OTHER): Payer: Medicare Other | Admitting: Family Medicine

## 2020-12-07 DIAGNOSIS — U071 COVID-19: Secondary | ICD-10-CM | POA: Diagnosis not present

## 2020-12-07 NOTE — Progress Notes (Signed)
Virtual Visit via Video Note  I connected with Samantha Cross on 12/07/20 at 11:30 AM EDT by a video enabled telemedicine application and verified that I am speaking with the correct person using two identifiers.   I discussed the limitations of evaluation and management by telemedicine and the availability of in person appointments. The patient expressed understanding and agreed to proceed.  Patient location: at home Provider location: in office  Subjective:    CC: COVID -19  HPI: Sxs started on Monday with URI sxs.  Tested positive on Monday. Her sxs have been cough, sore throat and headache. + fever.  Voice hoarseness. No GI sxs. She reports that her sister in law tested positive on Sunday.+ exposure. Husband has been sick aswell.    She has only been taking IBU for her sxs.   She is interested in taking the medication for COVID however she is concerned about the effects of what it may have on her liver due to being a Cancer patient.    Past medical history, Surgical history, Family history not pertinant except as noted below, Social history, Allergies, and medications have been entered into the medical record, reviewed, and corrections made.    Objective:    General: Speaking clearly in complete sentences without any shortness of breath.  Alert and oriented x3.  Normal judgment. No apparent acute distress.    Impression and Recommendations:    No problem-specific Assessment & Plan notes found for this encounter.  COVID 19 -reviewed current quarantine guidelines.  She is moderate risk for complications and we discussed potential options off.  For now we will hold off on prescription treatment because of potential risk for liver toxicity.  She seems to be doing well discussed symptoms for when to call  No orders of the defined types were placed in this encounter.   No orders of the defined types were placed in this encounter.    I discussed the assessment and  treatment plan with the patient. The patient was provided an opportunity to ask questions and all were answered. The patient agreed with the plan and demonstrated an understanding of the instructions.   The patient was advised to call back or seek an in-person evaluation if the symptoms worsen or if the condition fails to improve as anticipated.   Beatrice Lecher, MD

## 2020-12-07 NOTE — Progress Notes (Signed)
Tested positive on Monday. Her sxs have been cough, sore throat and headache. She reports that her sister in law tested positive on Sunday.   She has only been taking IBU for her sxs.   She is interested in taking the medication for COVID however she is concerned about the effects of what it may have on her liver due to being a Cancer patient.

## 2021-02-01 ENCOUNTER — Telehealth: Payer: Self-pay | Admitting: Family Medicine

## 2021-02-01 NOTE — Telephone Encounter (Signed)
Left message for patient to call back and schedule Medicare Annual Wellness Visit (AWV) either virtually or in office.  I gave my number [phone]  Samantha Cross last WTM 04/15/13/ please schedule at anytime with health coach  This should be a 40 minute visit.

## 2021-02-07 ENCOUNTER — Other Ambulatory Visit: Payer: Self-pay | Admitting: Family Medicine

## 2021-02-07 DIAGNOSIS — I10 Essential (primary) hypertension: Secondary | ICD-10-CM

## 2021-02-24 ENCOUNTER — Other Ambulatory Visit: Payer: Self-pay | Admitting: Family Medicine

## 2021-02-24 DIAGNOSIS — I10 Essential (primary) hypertension: Secondary | ICD-10-CM

## 2021-02-28 ENCOUNTER — Ambulatory Visit: Payer: Medicare Other | Admitting: Family Medicine

## 2021-05-01 NOTE — Assessment & Plan Note (Deleted)
04/23/2018 palpable lump in right breast UOQ, mammogram revealed 2 separate masses. 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, margins negative, 2/3 lymph nodes positive, ER 95%, PR 0%, HER-2 negative, Ki-67 10%  Treatment plan: 1.Neoadjuvant antiestrogen therapy12/02/2018-July 2020 3.12/24/2018 Right lumpectomy: Residual invasive ductal carcinoma 3.8 cm, margins negative, 2/3 lymph nodes positive, ER 95%, PR 0%, HER-2 negative, Ki-67 10%  03/11/2019 bilateral mastectomies [name]):  Left breast: benign tissue and two negative lymph nodes Right breast: residual IDC0.5 cm , clear margins, 2 lymph nodes negative, ER 95%, PR 0%, HER-2 -1+, Ki-67 10%  4.MammaPrint high risk: Adjuvant chemotherapy with dose dense Adriamycin and Cytoxan followed by Taxolcompleted 08/25/2019 5.adjuvant radiation therapy4/20/21-5/26/21 5.Followed by adjuvant antiestrogen therapy with letrozole ----------------------------------------------------------------------------------------------------------------------------------------------- clinical trialSWOG 1567 ---------------------------------------------------------------------------------------------------------------------------------- Letrozole Toxicities: Tolerating it extremely well. Hip discomfort: Encouraged her to do some stretching  Breast Cancer Surveillance: 1. Breast exam 05/02/21: Normal 2. Mammogram she had bilateral mastectomies therefore there is no role of  mammograms  She is contemplating on getting plastic surgery done for breast reconstruction. RTC in 1 year

## 2021-05-01 NOTE — Progress Notes (Incomplete)
Patient Care Team: [name], MD as PCP - General (Family Medicine) [name], RN as Oncology Nurse Navigator [name], RN as Oncology Nurse Navigator  DIAGNOSIS: No diagnosis found.  SUMMARY OF ONCOLOGIC HISTORY: Oncology History  Malignant neoplasm of upper-outer quadrant of right breast in female, estrogen receptor positive ([place])  04/23/2018 Initial Diagnosis   Palpable lump in right breast UOQ, mammogram revealed 2 separate masses.  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  The Multi-Gene Panel offered by [facility] includes sequencing and/or deletion duplication testing of the following 85 genes: AIP, ALK, APC, ATM, AXIN2,BAP1,  BARD1, BLM, BMPR1A, BRCA1, BRCA2, BRIP1, CASR, CDC73, CDH1, CDK4, CDKN1B, CDKN1C, CDKN2A (p14ARF), CDKN2A (p16INK4a), CEBPA, CHEK2, CTNNA1, DICER1, DIS3L2, EGFR (c.2369C>T, p.Thr790Met variant only), EPCAM (Deletion/duplication testing only), FH, FLCN, GATA2, GPC3, GREM1 (Promoter region deletion/duplication testing only), HOXB13 (c.251G>A, p.Gly84Glu), HRAS, KIT, MAX, MEN1, MET, MITF (c.952G>A, p.Glu318Lys variant only), MLH1, MSH2, MSH3, MSH6, MUTYH, NBN, NF1, NF2, NTHL1, PALB2, PDGFRA, PHOX2B, PMS2, POLD1, POLE, POT1, PRKAR1A, PTCH1, PTEN, RAD50, RAD51C, RAD51D, RB1, RECQL4, RET, RNF43, RUNX1, SDHAF2, SDHA (sequence changes only), SDHB, SDHC, SDHD, SMAD4,  SMARCA4, SMARCB1, SMARCE1, STK11, SUFU, TERC, TERT, TMEM127, TP53, TSC1, TSC2, VHL, WRN and WT1.     12/24/2018 Surgery   Right lumpectomy [name]): residual invasive carcinoma, clear margins, and 2/3 lymph nodes positive for carcinoma.    01/14/2019 Cancer Staging   Staging form: Breast, AJCC 8th Edition - Pathologic stage from 01/14/2019: No Stage Recommended (ypT2, pN1a, cM0, G2, ER+, PR-, HER2-) - Signed by [name], MD on 01/14/2019    01/22/2019 Miscellaneous   MammaPrint: High risk luminal type B   03/11/2019 Surgery   Bilateral mastectomies [name]):   Left breast: benign tissue and two negative lymph nodes Right breast: residual IDC 0.5 cm, clear margins, 2 lymph nodes negativeER 95%, PR 0%, HER-2 -1+, Ki-67 10%   04/14/2019 -  Chemotherapy   The patient had dexamethasone (DECADRON) 4 MG tablet, 1 of 1 cycle, Start date: 03/23/2019, End date: 08/11/2019 DOXOrubicin (ADRIAMYCIN) chemo injection 102 mg, 60 mg/m2 = 102 mg, Intravenous,  Once, 4 of 4 cycles Dose modification: 50 mg/m2 (original dose 60 mg/m2, Cycle 2, Reason: Dose not tolerated) Administration: 102 mg (04/14/2019), 84 mg (04/28/2019), 84 mg (05/12/2019), 84 mg (05/26/2019) palonosetron (ALOXI) injection 0.25 mg, 0.25 mg, Intravenous,  Once, 4 of 4 cycles Administration: 0.25 mg (04/14/2019), 0.25 mg (04/28/2019), 0.25 mg (05/12/2019), 0.25 mg (05/26/2019) pegfilgrastim-jmdb (FULPHILA) injection 6 mg, 6 mg, Subcutaneous,  Once, 4 of 4 cycles Administration: 6 mg (04/16/2019), 6 mg (04/30/2019), 6 mg (05/14/2019), 6 mg (05/28/2019) cyclophosphamide (CYTOXAN) 1,020 mg in sodium chloride 0.9 % 250 mL chemo infusion, 600 mg/m2 = 1,020 mg, Intravenous,  Once, 4 of 4 cycles Dose modification: 500 mg/m2 (original dose 600 mg/m2, Cycle 2, Reason: Dose not tolerated) Administration: 1,020 mg (04/14/2019), 840 mg (04/28/2019), 840  mg (05/12/2019), 840 mg (05/26/2019) PACLitaxel (TAXOL) 138 mg in sodium chloride 0.9 % 250 mL chemo  infusion (</= 62m/m2), 80 mg/m2 = 138 mg, Intravenous,  Once, 12 of 12 cycles Dose modification: 65 mg/m2 (original dose 80 mg/m2, Cycle 7, Reason: Dose not tolerated, Comment: neuropathy) Administration: 138 mg (06/09/2019), 108 mg (06/16/2019), 108 mg (06/23/2019), 108 mg (06/30/2019), 108 mg (07/07/2019), 108 mg (07/14/2019), 108 mg (07/21/2019), 108 mg (07/28/2019), 108 mg (08/04/2019), 108 mg (08/11/2019), 108 mg (08/18/2019), 108 mg (08/25/2019) fosaprepitant (EMEND) 150 mg, dexamethasone (DECADRON) 12 mg in sodium chloride 0.9 % 145 mL IVPB, , Intravenous,  Once, 4 of 4 cycles Administration:  (04/14/2019),  (04/28/2019),  (05/12/2019),  (05/26/2019)   for chemotherapy treatment.     09/22/2019 - 10/30/2019 Radiation Therapy   Adjuvant radiation therapy     CHIEF COMPLIANT: Follow-up of right breast cancer on Letrozole  INTERVAL HISTORY: Samantha Brenneris a 75y.o. with above-mentioned history of right breast cancer treated with neoadjuvant letrozole, lumpectomy followed by bilateral mastectomies, adjuvant chemotherapy, radiation, and is currently on antiestrogen therapy with Letrozole. She presents to the clinic today for follow-up.   ALLERGIES:  is allergic to lisinopril, alendronate [alendronate], and hydroxyzine.  MEDICATIONS:  Current Outpatient Medications  Medication Sig Dispense Refill   amLODipine (NORVASC) 10 MG tablet TAKE 1 TABLET(10 MG) BY MOUTH DAILY 90 tablet 0   letrozole (FEMARA) 2.5 MG tablet TAKE 1 TABLET(2.5 MG) BY MOUTH DAILY 90 tablet 1   lidocaine-prilocaine (EMLA) cream Apply to affected area once 30 g 3   Multiple Vitamins-Minerals (HAIR SKIN AND NAILS FORMULA) TABS Take by mouth.     OVER THE COUNTER MEDICATION Take 2 capsules by mouth 2 (two) times daily as needed (Supplement). [employer]     simvastatin (ZOCOR) 40 MG tablet Take 1 tablet (40 mg total) by mouth at bedtime. 90 tablet 3   No current facility-administered medications for this visit.     PHYSICAL EXAMINATION: ECOG PERFORMANCE STATUS: {CHL ONC ECOG PS:[phone]}  There were no vitals filed for this visit. There were no vitals filed for this visit.  BREAST:*** No palpable masses or nodules in either right or left breasts. No palpable axillary supraclavicular or infraclavicular adenopathy no breast tenderness or nipple discharge. (exam performed in the presence of a chaperone)  LABORATORY DATA:  I have reviewed the data as listed CMP Latest Ref Rng & Units 08/17/2020 08/25/2019 08/18/2019  Glucose 65 - 99 mg/dL 102(H) 106(H) 105(H)  BUN 7 - 25 mg/dL 11 9 7(L)  Creatinine 0.60 - 0.93 mg/dL 0.64 0.73 0.66  Sodium 135 - 146 mmol/L 142 143 142  Potassium 3.5 - 5.3 mmol/L 4.0 3.4(L) 3.6  Chloride 98 - 110 mmol/L 105 108 109  CO2 20 - 32 mmol/L _0 Calcium 8.6 - 10.4 mg/dL 9.8 8.9 8.9  Total Protein 6.1 - 8.1 g/dL 7.3 6.4(L) 6.5  Total Bilirubin 0.2 - 1.2 mg/dL 0.8 0.6 0.6  Alkaline Phos 38 - 126 U/L - 67 65  AST 10 - 35 U/L 18 14(L) 13(L)  ALT 6 - 29 U/L _1 Lab Results  Component Value Date   WBC 5.4 08/17/2020   HGB 15.2 08/17/2020   HCT 45.4 (H) 08/17/2020   MCV 91.2 08/17/2020   PLT 264 08/17/2020   NEUTROABS 4.1 08/25/2019    ASSESSMENT & PLAN:  No problem-specific Assessment & Plan notes found for this encounter.    No orders of  the defined types were placed in this encounter.  The patient has a good understanding of the overall plan. she agrees with it. she will call with any problems that may develop before the next visit here.  Total time spent: *** mins including face to face time and time spent for planning, charting and coordination of care  Rulon Eisenmenger, MD, MPH 05/01/2021  I, Thana Ates, am acting as scribe for Dr. [name].  {insert scribe attestation}

## 2021-05-02 ENCOUNTER — Inpatient Hospital Stay: Payer: Medicare Other | Attending: Hematology and Oncology | Admitting: Hematology and Oncology

## 2021-05-23 ENCOUNTER — Other Ambulatory Visit: Payer: Self-pay | Admitting: Family Medicine

## 2021-05-23 DIAGNOSIS — I10 Essential (primary) hypertension: Secondary | ICD-10-CM

## 2021-05-23 NOTE — Telephone Encounter (Signed)
Pt has been scheduled for BP f/u on 05/31/2021.

## 2021-05-23 NOTE — Telephone Encounter (Signed)
Please call pt and have her schedule f/u for bp and medication refills. 30 day supply sent

## 2021-05-24 NOTE — Assessment & Plan Note (Signed)
04/23/2018 palpable lump in right breast UOQ, mammogram revealed 2 separate masses. 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, margins negative, 2/3 lymph nodes positive, ER 95%, PR 0%, HER-2 negative, Ki-67 10%  Treatment plan: 1.Neoadjuvant antiestrogen therapy12/02/2018-July 2020 3.12/24/2018 Right lumpectomy: Residual invasive ductal carcinoma 3.8 cm, margins negative, 2/3 lymph nodes positive, ER 95%, PR 0%, HER-2 negative, Ki-67 10%  03/11/2019 bilateral mastectomies [name]):  Left breast: benign tissue and two negative lymph nodes Right breast: residual IDC0.5 cm , clear margins, 2 lymph nodes negative, ER 95%, PR 0%, HER-2 -1+, Ki-67 10%  4.MammaPrint high risk: Adjuvant chemotherapy with dose dense Adriamycin and Cytoxan followed by Taxolcompleted 08/25/2019 5.adjuvant radiation therapy4/20/21-5/26/21 5.Followed by adjuvant antiestrogen therapy with letrozole ----------------------------------------------------------------------------------------------------------------------------------------------- clinical trialSWOG 0970 ---------------------------------------------------------------------------------------------------------------------------------- Letrozole Toxicities: Tolerating it extremely well. Hip discomfort: Encouraged her to do some stretching  Breast Cancer Surveillance: 1. Breast exam 05/25/21: Normal 2. she had bilateral mastectomies therefore there is no role of mammograms  She  is contemplating on getting plastic surgery done for breast reconstruction. RTC in 1 year

## 2021-05-24 NOTE — Progress Notes (Signed)
Patient Care Team: [name], MD as PCP - General (Family Medicine) [name], RN as Oncology Nurse Navigator [name], RN as Oncology Nurse Navigator  DIAGNOSIS:    ICD-10-CM   1. Malignant neoplasm of upper-outer quadrant of right breast in female, estrogen receptor positive ([place])  C50.411    Z17.0       SUMMARY OF ONCOLOGIC HISTORY: Oncology History  Malignant neoplasm of upper-outer quadrant of right breast in female, estrogen receptor positive ([place])  04/23/2018 Initial Diagnosis   Palpable lump in right breast UOQ, mammogram revealed 2 separate masses.  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  The Multi-Gene Panel offered by [facility] includes sequencing and/or deletion duplication testing of the following 85 genes: AIP, ALK, APC, ATM, AXIN2,BAP1,  BARD1, BLM, BMPR1A, BRCA1, BRCA2, BRIP1, CASR, CDC73, CDH1, CDK4, CDKN1B, CDKN1C, CDKN2A (p14ARF), CDKN2A (p16INK4a), CEBPA, CHEK2, CTNNA1, DICER1, DIS3L2, EGFR (c.2369C>T, p.Thr790Met variant only), EPCAM (Deletion/duplication testing only), FH, FLCN, GATA2, GPC3, GREM1 (Promoter region deletion/duplication testing only), HOXB13 (c.251G>A, p.Gly84Glu), HRAS, KIT, MAX, MEN1, MET, MITF (c.952G>A, p.Glu318Lys variant only), MLH1, MSH2, MSH3, MSH6, MUTYH, NBN, NF1, NF2, NTHL1, PALB2, PDGFRA, PHOX2B, PMS2, POLD1, POLE, POT1, PRKAR1A, PTCH1,  PTEN, RAD50, RAD51C, RAD51D, RB1, RECQL4, RET, RNF43, RUNX1, SDHAF2, SDHA (sequence changes only), SDHB, SDHC, SDHD, SMAD4, SMARCA4, SMARCB1, SMARCE1, STK11, SUFU, TERC, TERT, TMEM127, TP53, TSC1, TSC2, VHL, WRN and WT1.     12/24/2018 Surgery   Right lumpectomy [name]): residual invasive carcinoma, clear margins, and 2/3 lymph nodes positive for carcinoma.    01/14/2019 Cancer Staging   Staging form: Breast, AJCC 8th Edition - Pathologic stage from 01/14/2019: No Stage Recommended (ypT2, pN1a, cM0, G2, ER+, PR-, HER2-) - Signed by [name], MD on 01/14/2019    01/22/2019 Miscellaneous   MammaPrint: High risk luminal type B   03/11/2019 Surgery   Bilateral mastectomies [name]):   Left breast: benign tissue and two negative lymph nodes Right breast: residual IDC 0.5 cm, clear margins, 2 lymph nodes negativeER 95%, PR 0%, HER-2 -1+, Ki-67 10%   04/14/2019 -  Chemotherapy   The patient had dexamethasone (DECADRON) 4 MG tablet, 1 of 1 cycle, Start date: 03/23/2019, End date: 08/11/2019 DOXOrubicin (ADRIAMYCIN) chemo injection 102 mg, 60 mg/m2 = 102 mg, Intravenous,  Once, 4 of 4 cycles Dose modification: 50 mg/m2 (original dose 60 mg/m2, Cycle 2, Reason: Dose not tolerated) Administration: 102 mg (04/14/2019), 84 mg (04/28/2019), 84 mg (05/12/2019), 84 mg (05/26/2019) palonosetron (ALOXI) injection 0.25 mg, 0.25 mg, Intravenous,  Once, 4 of 4 cycles Administration: 0.25 mg (04/14/2019), 0.25 mg (04/28/2019), 0.25 mg (05/12/2019), 0.25 mg (05/26/2019) pegfilgrastim-jmdb (FULPHILA) injection 6 mg, 6 mg, Subcutaneous,  Once, 4 of 4 cycles Administration: 6 mg (04/16/2019), 6 mg (04/30/2019), 6 mg (05/14/2019), 6 mg (05/28/2019) cyclophosphamide (CYTOXAN) 1,020 mg in sodium chloride 0.9 % 250 mL chemo infusion, 600 mg/m2 = 1,020 mg, Intravenous,  Once, 4 of 4 cycles Dose modification: 500 mg/m2 (original dose 600 mg/m2, Cycle 2, Reason: Dose not tolerated) Administration: 1,020 mg (04/14/2019), 840 mg  (04/28/2019), 840 mg (05/12/2019), 840 mg (05/26/2019) PACLitaxel (TAXOL) 138 mg in sodium chloride 0.9 % 250 mL chemo infusion (</= 85m/m2), 80 mg/m2 = 138 mg, Intravenous,  Once, 12 of 12 cycles Dose modification: 65 mg/m2 (original dose 80 mg/m2, Cycle 7, Reason: Dose not tolerated, Comment: neuropathy) Administration: 138 mg (06/09/2019), 108 mg (06/16/2019), 108 mg (06/23/2019), 108 mg (06/30/2019), 108 mg (07/07/2019), 108 mg (07/14/2019), 108 mg (07/21/2019), 108 mg (07/28/2019), 108 mg (08/04/2019), 108 mg (08/11/2019), 108 mg (08/18/2019), 108 mg (08/25/2019) fosaprepitant (EMEND) 150 mg, dexamethasone (DECADRON) 12 mg in sodium chloride 0.9 % 145 mL IVPB, , Intravenous,  Once, 4 of 4 cycles Administration:  (04/14/2019),  (04/28/2019),  (05/12/2019),  (05/26/2019)   for chemotherapy treatment.     09/22/2019 - 10/30/2019 Radiation Therapy   Adjuvant radiation therapy   11/2019 -  Anti-estrogen oral therapy   Letrozole 2.5 mg daily     CHIEF COMPLIANT: Follow-up of right breast cancer on Letrozole  INTERVAL HISTORY: Samantha Cross a 75y.o. with above-mentioned history of right breast cancer treated with neoadjuvant letrozole, lumpectomy followed by bilateral mastectomies, adjuvant chemotherapy, radiation, and is currently on antiestrogen therapy with Letrozole. She presents to the clinic today for follow-up.  She is tolerating letrozole extremely well without any problems or concerns.  Her only complaint is she is still having right-sided chest wall discomfort.  ALLERGIES:  is allergic to lisinopril, alendronate [alendronate], and hydroxyzine.  MEDICATIONS:  Current Outpatient Medications  Medication Sig Dispense Refill   amLODipine (NORVASC) 10 MG tablet Take 1 tablet (10 mg total) by mouth daily. 3[place] PLEASE CALL THE OFFICE TO SCHEDULE AN APPOINTMENT FOR REFILLS. 30 tablet 0   letrozole (FEMARA) 2.5 MG tablet Take 1 tablet (2.5 mg total) by mouth daily. 90 tablet 3    lidocaine-prilocaine (EMLA) cream Apply to affected area once 30 g 3   Multiple Vitamins-Minerals (HAIR SKIN AND NAILS FORMULA) TABS Take by mouth.     OVER THE COUNTER MEDICATION Take 2 capsules by mouth 2 (two) times daily as needed (Supplement). [employer]     simvastatin (ZOCOR) 40 MG tablet Take 1 tablet (40 mg total) by mouth at bedtime. 90 tablet 3   No current facility-administered medications for this visit.    PHYSICAL EXAMINATION: ECOG PERFORMANCE STATUS: 1 - Symptomatic but completely ambulatory  Vitals:   05/25/21 1038  BP: 128/74  Pulse: 67  Resp: 18  Temp: 98.2 F (36.8 C)  SpO2: 100%   Filed Weights   05/25/21 1038  Weight: 134 lb 6.4 oz (61 kg)    BREAST: Tenderness in the right chest wall. (exam performed in the presence of a chaperone)  LABORATORY DATA:  I have reviewed the data as listed CMP Latest Ref Rng & Units 08/17/2020 08/25/2019 08/18/2019  Glucose 65 - 99 mg/dL 102(H) 106(H) 105(H)  BUN 7 - 25 mg/dL 11 9 7(L)  Creatinine 0.60 - 0.93 mg/dL 0.64 0.73 0.66  Sodium 135 - 146 mmol/L 142 143 142  Potassium 3.5 - 5.3 mmol/L 4.0 3.4(L) 3.6  Chloride 98 - 110 mmol/L 105 108 109  CO2 20 - 32 mmol/L '30 26 24  ' Calcium 8.6 - 10.4 mg/dL 9.8 8.9 8.9  Total Protein 6.1 - 8.1 g/dL 7.3 6.4(L) 6.5  Total Bilirubin 0.2 - 1.2 mg/dL 0.8 0.6  0.6  Alkaline Phos 38 - 126 U/L - 67 65  AST 10 - 35 U/L 18 14(L) 13(L)  ALT 6 - 29 U/L '15 14 14    ' Lab Results  Component Value Date   WBC 5.4 08/17/2020   HGB 15.2 08/17/2020   HCT 45.4 (H) 08/17/2020   MCV 91.2 08/17/2020   PLT 264 08/17/2020   NEUTROABS 4.1 08/25/2019    ASSESSMENT & PLAN:  Malignant neoplasm of upper-outer quadrant of right breast in female, estrogen receptor positive ([place]) 04/23/2018 palpable lump in right breast UOQ, mammogram revealed 2 separate masses.  Larger at 12 o'clock position 5.3 cm and smaller at 10 o'clock position 2 cm, biopsy revealed invasive lobular carcinoma grade 2-3 with  LCIS intermediate grade, ER 95%, PR 0%, Ki-67 10%, HER-2 negative IHC 1+ T3 N0 stage 3A   MRI breast 05/19/2018: 5.7 x 4.9 x 3.3 cm mass central right breast, no lymph nodes 05/22/2018: CT CAP: Right renal lesion 06/06/2017: MRI abdomen: Complex cystic lesion right kidney, probably benign but requires follow-up in 6 months   Right lumpectomy: Residual invasive ductal carcinoma 3.8 cm, margins negative, 2/3 lymph nodes positive, ER 95%, PR 0%, HER-2 negative, Ki-67 10%   Treatment plan: 1.  Neoadjuvant antiestrogen therapy 05/12/2018-July 2020 3.  12/24/2018 Right lumpectomy: Residual invasive ductal carcinoma 3.8 cm, margins negative, 2/3 lymph nodes positive, ER 95%, PR 0%, HER-2 negative, Ki-67 10%  03/11/2019 bilateral mastectomies [name]):   Left breast: benign tissue and two negative lymph nodes Right breast: residual IDC 0.5 cm , clear margins, 2 lymph nodes negative, ER 95%, PR 0%, HER-2 -1+, Ki-67 10%   4.  MammaPrint high risk: Adjuvant chemotherapy with dose dense Adriamycin and Cytoxan followed by Taxol completed 08/25/2019 5. adjuvant radiation therapy 09/22/19-10/28/19 5.  Followed by adjuvant antiestrogen therapy with letrozole ----------------------------------------------------------------------------------------------------------------------------------------------- clinical trial SWOG 1714 ---------------------------------------------------------------------------------------------------------------------------------- Letrozole Toxicities: Tolerating it extremely well. Hip discomfort: Encouraged her to do some stretching   Breast Cancer Surveillance: 1. Breast exam 05/25/21: Normal 2. she had bilateral mastectomies therefore there is no role of mammograms   She wants to get Dr. [name] to remove the extra soft tissue in the axillary area. RTC in 1 year      No orders of the defined types were placed in this encounter.  The patient has a good understanding of the overall  plan. she agrees with it. she will call with any problems that may develop before the next visit here.  Total time spent: 20 mins including face to face time and time spent for planning, charting and coordination of care  Rulon Eisenmenger, MD, MPH 05/25/2021  I, Thana Ates, am acting as scribe for Dr. [name].  I have reviewed the above documentation for accuracy and completeness, and I agree with the above.

## 2021-05-25 ENCOUNTER — Other Ambulatory Visit: Payer: Self-pay

## 2021-05-25 ENCOUNTER — Inpatient Hospital Stay: Payer: Medicare Other | Attending: Hematology and Oncology | Admitting: Hematology and Oncology

## 2021-05-25 DIAGNOSIS — Z923 Personal history of irradiation: Secondary | ICD-10-CM | POA: Insufficient documentation

## 2021-05-25 DIAGNOSIS — Z9013 Acquired absence of bilateral breasts and nipples: Secondary | ICD-10-CM | POA: Diagnosis not present

## 2021-05-25 DIAGNOSIS — Z79811 Long term (current) use of aromatase inhibitors: Secondary | ICD-10-CM | POA: Diagnosis not present

## 2021-05-25 DIAGNOSIS — C50411 Malignant neoplasm of upper-outer quadrant of right female breast: Secondary | ICD-10-CM | POA: Insufficient documentation

## 2021-05-25 DIAGNOSIS — Z170 Estrogen receptor positive status [ER+]: Secondary | ICD-10-CM | POA: Insufficient documentation

## 2021-05-25 MED ORDER — LETROZOLE 2.5 MG PO TABS: 2.5000 mg | ORAL_TABLET | Freq: Every day | ORAL | 3 refills | Status: DC

## 2021-05-31 ENCOUNTER — Encounter: Payer: Self-pay | Admitting: Family Medicine

## 2021-05-31 ENCOUNTER — Ambulatory Visit (INDEPENDENT_AMBULATORY_CARE_PROVIDER_SITE_OTHER): Payer: Medicare Other | Admitting: Family Medicine

## 2021-05-31 ENCOUNTER — Other Ambulatory Visit: Payer: Self-pay

## 2021-05-31 VITALS — BP 134/68 | HR 65 | Temp 97.6°F | Resp 16 | Ht 63.0 in | Wt 134.0 lb

## 2021-05-31 DIAGNOSIS — H8111 Benign paroxysmal vertigo, right ear: Secondary | ICD-10-CM | POA: Diagnosis not present

## 2021-05-31 DIAGNOSIS — Z23 Encounter for immunization: Secondary | ICD-10-CM

## 2021-05-31 DIAGNOSIS — I10 Essential (primary) hypertension: Secondary | ICD-10-CM | POA: Diagnosis not present

## 2021-05-31 MED ORDER — AMLODIPINE BESYLATE 10 MG PO TABS: 10.0000 mg | ORAL_TABLET | Freq: Every day | ORAL | 1 refills | Status: DC

## 2021-05-31 NOTE — Assessment & Plan Note (Signed)
Well controlled. Continue current regimen. Follow up in  6 mo . Due for BMP 

## 2021-05-31 NOTE — Progress Notes (Signed)
Established Patient Office Visit  Subjective:  Patient ID: Samantha Cross, female    DOB: 13-Dec-1945  Age: 75 y.o. MRN: [id number]  CC:  Chief Complaint  Patient presents with   Hypertension    Follow up     HPI [facility] presents for   Hypertension- Pt denies chest pain, SOB, dizziness, or heart palpitations.  Taking meds as directed w/o problems.  Denies medication side effects.    Woke up feeling dizzy this morning.  She says it just came on all of a sudden.  It seems to be worse with movement.  She wants me to check her ears she says she has not had any ear fullness or pain or discomfort no upper respiratory symptoms.  No sore throat.  No allergies.  She has not been taking any extra medications.   Past Medical History:  Diagnosis Date   Arthritis    Diverticulosis    Family history of brain cancer    Family history of breast cancer    Family history of colon cancer    Family history of melanoma    Family history of ovarian cancer    Hemorrhoids    Hyperlipidemia    Hypertension    Postmenopausal    rt breast ca dx'd 05/2018    Past Surgical History:  Procedure Laterality Date   ABDOMINAL HYSTERECTOMY     without BSO/heavy periods   BLADDER SURGERY  when she was in her 30's   Her bladder was punctured during her hysterectomy and needed to be repaired.    BREAST LUMPECTOMY WITH RADIOACTIVE SEED AND SENTINEL LYMPH NODE BIOPSY Right 12/24/2018   Procedure: RIGHT BREAST LUMPECTOMY WITH RADIOACTIVE SEED AND SENTINEL LYMPH NODE BIOPSY;  Surgeon: Jovita Kussmaul, MD;  Location: [place];  Service: General;  Laterality: Right;   CLEFT PALATE REPAIR     MIDDLE EAR SURGERY     left   PORT-A-CATH REMOVAL Left 11/06/2019   Procedure: REMOVAL OF PORT;  Surgeon: Jovita Kussmaul, MD;  Location: [place];  Service: General;  Laterality: Left;   PORTACATH PLACEMENT Left 03/11/2019   Procedure: INSERTION PORT-A-CATH  POSSILBLE ULTRASOUND;  Surgeon: Jovita Kussmaul, MD;  Location: [place];  Service: General;  Laterality: Left;   TOTAL MASTECTOMY Bilateral 03/11/2019   Procedure: BILATERAL  MASTECTOMIES;  Surgeon: Jovita Kussmaul, MD;  Location: [place];  Service: General;  Laterality: Bilateral;    Family History  Problem Relation Age of Onset   Ovarian cancer Mother        dx under 79; d. 81   Colon cancer Mother 23   Cancer Brother        brain tumor/gliobastoma, multiforme   Melanoma Brother    Kidney cancer Brother 64   Cancer Brother        renal cell carcinoma, colon ca   Heart attack Father 83       died/CVA   Heart attack Brother 15   Hyperlipidemia Other    Breast cancer Sister 13   Cancer Maternal Aunt        NOS   Colon cancer Maternal Uncle        dx in his 60s   Colon cancer Maternal Grandmother        dx over 66    Social History   Socioeconomic History   Marital status: Married    Spouse name: Not on file   Number of children: Not  on file   Years of education: Not on file   Highest education level: Not on file  Occupational History   Occupation: has a farm.   Tobacco Use   Smoking status: Former    Types: Cigarettes    Quit date: 06/04/1998    Years since quitting: 23.0   Smokeless tobacco: Never  Vaping Use   Vaping Use: Never used  Substance and Sexual Activity   Alcohol use: Yes    Alcohol/week: 3.0 standard drinks    Types: 3 Glasses of wine per week    Comment: social   Drug use: No   Sexual activity: Not on file    Comment: married, 2 grown children, 2 stepkids, no regular exercise, 3 beagles, working PT at daycare.  Other Topics Concern   Not on file  Social History Narrative   Not on file   Social Determinants of Health   Financial Resource Strain: Not on file  Food Insecurity: Not on file  Transportation Needs: Not on file  Physical Activity: Not on file  Stress: Not on file  Social Connections: Not on file  Intimate Partner Violence: Not on file     Outpatient Medications Prior to Visit  Medication Sig Dispense Refill   letrozole (FEMARA) 2.5 MG tablet Take 1 tablet (2.5 mg total) by mouth daily. 90 tablet 3   Multiple Vitamins-Minerals (HAIR SKIN AND NAILS FORMULA) TABS Take by mouth.     OVER THE COUNTER MEDICATION Take 2 capsules by mouth 2 (two) times daily as needed (Supplement). [employer]     simvastatin (ZOCOR) 40 MG tablet Take 1 tablet (40 mg total) by mouth at bedtime. 90 tablet 3   amLODipine (NORVASC) 10 MG tablet Take 1 tablet (10 mg total) by mouth daily. [place]. PLEASE CALL THE OFFICE TO SCHEDULE AN APPOINTMENT FOR REFILLS. 30 tablet 0   lidocaine-prilocaine (EMLA) cream Apply to affected area once 30 g 3   No facility-administered medications prior to visit.    Allergies  Allergen Reactions   Lisinopril     Tongue swelling   Alendronate [Alendronate] Other (See Comments)    Chest pain, palpitations.    Hydroxyzine Other (See Comments)    Made her feel weird    ROS Review of Systems    Objective:    Physical Exam Constitutional:      Appearance: She is well-developed.  HENT:     Head: Normocephalic and atraumatic.     Right Ear: Ear canal and external ear normal.     Left Ear: Ear canal and external ear normal.     Ears:     Comments: Have abnormal TMs but no erythema or fluid.  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    Nose: Nose normal.     Mouth/Throat:     Mouth: Mucous membranes are moist.     Pharynx: Oropharynx is clear. No oropharyngeal exudate or posterior oropharyngeal erythema.  Eyes:     Conjunctiva/sclera: Conjunctivae normal.     Pupils: Pupils are equal, round, and reactive to light.  Neck:     Thyroid: No thyromegaly.  Cardiovascular:     Rate and Rhythm: Normal rate and regular rhythm.     Heart sounds: Normal heart sounds.  Pulmonary:     Effort: Pulmonary effort is normal.     Breath sounds: Normal breath sounds. No wheezing.  Musculoskeletal:      Cervical back: Neck supple.  Lymphadenopathy:  Cervical: No cervical adenopathy.  Skin:    General: Skin is warm and dry.  Neurological:     Mental Status: She is alert and oriented to person, place, and time.     Comments: Positive a Dix-Hallpike maneuver to the right.    BP 134/68    Pulse 65    Temp 97.6 F (36.4 C)    Resp 16    Ht 5\' 3"  (1.6 m)    Wt 134 lb (60.8 kg)    SpO2 100%    BMI 23.74 kg/m  Wt Readings from Last 3 Encounters:  05/31/21 134 lb (60.8 kg)  05/25/21 134 lb 6.4 oz (61 kg)  08/17/20 135 lb (61.2 kg)     There are no preventive care reminders to display for this patient.   There are no preventive care reminders to display for this patient.  No results found for: TSH Lab Results  Component Value Date   WBC 5.4 08/17/2020   HGB 15.2 08/17/2020   HCT 45.4 (H) 08/17/2020   MCV 91.2 08/17/2020   PLT 264 08/17/2020   Lab Results  Component Value Date   NA 142 08/17/2020   K 4.0 08/17/2020   CO2 30 08/17/2020   GLUCOSE 102 (H) 08/17/2020   BUN 11 08/17/2020   CREATININE 0.64 08/17/2020   BILITOT 0.8 08/17/2020   ALKPHOS 67 08/25/2019   AST 18 08/17/2020   ALT 15 08/17/2020   PROT 7.3 08/17/2020   ALBUMIN 3.7 08/25/2019   CALCIUM 9.8 08/17/2020   ANIONGAP 9 08/25/2019   Lab Results  Component Value Date   CHOL 195 08/17/2020   Lab Results  Component Value Date   HDL 84 08/17/2020   Lab Results  Component Value Date   LDLCALC 92 08/17/2020   Lab Results  Component Value Date   TRIG 93 08/17/2020   Lab Results  Component Value Date   CHOLHDL 2.3 08/17/2020   No results found for: HGBA1C    Assessment & Plan:   Problem List Items Addressed This Visit       Cardiovascular and Mediastinum   Essential hypertension, benign    Well controlled. Continue current regimen. Follow up in  6 mo. Due for BMP      Relevant Medications   amLODipine (NORVASC) 10 MG tablet   Other Visit Diagnoses     Need for pneumococcal  vaccination    -  Primary   Relevant Orders   Pneumococcal conjugate vaccine 20-valent (Prevnar 20) (Completed)   Benign positional vertigo, right       Relevant Orders   Ambulatory referral to Physical Therapy   Need for immunization against influenza       Relevant Orders   Flu Vaccine QUAD High Dose(Fluad) (Completed)      BPPB, Right -positive Dix-Hallpike maneuver on the right.  Discussed treatment and options.  She has had vertigo before.  Given home exercises to do on her own at home.  Also consider formal physical therapy referral as well.   Flu & Prevnar 20 vaccine given today.   Meds ordered this encounter  Medications   amLODipine (NORVASC) 10 MG tablet    Sig: Take 1 tablet (10 mg total) by mouth daily.    Dispense:  90 tablet    Refill:  1    Follow-up: Return in about 6 months (around 11/29/2021) for Hypertension.    Beatrice Lecher, MD

## 2021-06-01 ENCOUNTER — Telehealth: Payer: Self-pay | Admitting: Hematology and Oncology

## 2021-06-01 NOTE — Telephone Encounter (Signed)
Scheduled appointment per 12/22 los. Patient is aware. Patient will be mailed an updated calendar.

## 2021-06-02 ENCOUNTER — Ambulatory Visit (INDEPENDENT_AMBULATORY_CARE_PROVIDER_SITE_OTHER): Payer: Medicare Other | Admitting: Physical Therapy

## 2021-06-02 ENCOUNTER — Other Ambulatory Visit: Payer: Self-pay

## 2021-06-02 DIAGNOSIS — H8111 Benign paroxysmal vertigo, right ear: Secondary | ICD-10-CM

## 2021-06-02 DIAGNOSIS — R42 Dizziness and giddiness: Secondary | ICD-10-CM | POA: Diagnosis not present

## 2021-06-02 NOTE — Therapy (Signed)
[place], [place], [address] Phone: [phone]   Fax:  [phone]  Physical Therapy Evaluation  Patient Details  Name: Samantha Cross MRN: [id number] Date of Birth: 1946-03-19 No data recorded  Encounter Date: 06/02/2021   PT End of Session - 06/02/21 1003     Visit Number 1    Number of Visits 4    Date for PT Re-Evaluation 06/30/21    Authorization Type Medicare    PT Start Time 0930    PT Stop Time 1000    PT Time Calculation (min) 30 min    Activity Tolerance Patient tolerated treatment well    Behavior During Therapy [facility] for tasks assessed/performed             Past Medical History:  Diagnosis Date   Arthritis    Diverticulosis    Family history of brain cancer    Family history of breast cancer    Family history of colon cancer    Family history of melanoma    Family history of ovarian cancer    Hemorrhoids    Hyperlipidemia    Hypertension    Postmenopausal    rt breast ca dx'd 05/2018    Past Surgical History:  Procedure Laterality Date   ABDOMINAL HYSTERECTOMY     without BSO/heavy periods   BLADDER SURGERY  when she was in her 30's   Her bladder was punctured during her hysterectomy and needed to be repaired.    BREAST LUMPECTOMY WITH RADIOACTIVE SEED AND SENTINEL LYMPH NODE BIOPSY Right 12/24/2018   Procedure: RIGHT BREAST LUMPECTOMY WITH RADIOACTIVE SEED AND SENTINEL LYMPH NODE BIOPSY;  Surgeon: Jovita Kussmaul, MD;  Location: [place];  Service: General;  Laterality: Right;   CLEFT PALATE REPAIR     MIDDLE EAR SURGERY     left   PORT-A-CATH REMOVAL Left 11/06/2019   Procedure: REMOVAL OF PORT;  Surgeon: Jovita Kussmaul, MD;  Location: [place];  Service: General;  Laterality: Left;   PORTACATH PLACEMENT Left 03/11/2019   Procedure: INSERTION PORT-A-CATH POSSILBLE ULTRASOUND;  Surgeon: Jovita Kussmaul, MD;  Location: [place];  Service: General;  Laterality: Left;   TOTAL MASTECTOMY Bilateral 03/11/2019   Procedure: BILATERAL  MASTECTOMIES;  Surgeon: Jovita Kussmaul, MD;  Location: [place];  Service: General;  Laterality: Bilateral;    There were no vitals filed for this visit.    Subjective Assessment - 06/02/21 0933     Subjective Pt reports she's had this dizziness before. Pt states it's been a while back and she was treated for it. Pt states she has done her maneuvers and it's not as bad as when she went to the doctor. Pt reports this episode has been ongoing for 3-4 days. Has not been doing her maneuver every single day. Pt states she got out of bed to go to the doctor and felt it. Doctor checked it and her R ear was symptomtic.    Pertinent History Prior BPPV    How long can you sit comfortably? n/a    How long can you stand comfortably? n/a    How long can you walk comfortably? n/a    Patient Stated Goals Improve dizziness    Currently in Pain? No/denies                        Vestibular Assessment - 06/02/21 0001  Symptom Behavior   Type of Dizziness  Vertigo;"World moves"    Frequency of Dizziness Any time getting out of bed    Duration of Dizziness A few seconds    Symptom Nature Motion provoked;Positional    Aggravating Factors Looking up to the ceiling;Forward bending;Sitting with head tilted back    Relieving Factors Slow movements    Progression of Symptoms Better    History of similar episodes BPPV a few years ago      Positional Testing   Dix-Hallpike Dix-Hallpike Right;Dix-Hallpike Left      Dix-Hallpike Right   Dix-Hallpike Right Duration ~23 sec    Dix-Hallpike Right Symptoms Upbeat, right rotatory nystagmus      Dix-Hallpike Left   Dix-Hallpike Left Duration None                Objective measurements completed on examination: See above findings.        Vestibular Treatment/Exercise - 06/02/21 0001       Vestibular Treatment/Exercise    Vestibular Treatment Provided Canalith Repositioning    Canalith Repositioning Epley Manuever Right;Epley Manuever Left       EPLEY MANUEVER RIGHT   Number of Reps  3    Overall Response Improved Symptoms       EPLEY MANUEVER LEFT   Number of Reps  0                    PT Education - 06/02/21 1003     Education Details Discussed BPPV, how to check at home, and how to perform self epley maneuver at home    Person(s) Educated Patient    Methods Explanation;Demonstration;Tactile cues;Verbal cues;Handout    Comprehension Verbalized understanding;Returned demonstration;Verbal cues required;Tactile cues required                 PT Long Term Goals - 06/02/21 1009       PT LONG TERM GOAL #1   Title Pt will be ind with canalith repositioning    Time 4    Period Weeks    Status New    Target Date 06/30/21      PT LONG TERM GOAL #2   Title Pt will have resolution of all BPPV symptoms in all canalith testing positions    Time 4    Period Weeks    Status New    Target Date 06/30/21                    Plan - 06/02/21 1004     Clinical Impression Statement Ms. Rayfield is a 75 y/o F presenting to OPPT due to dizziness ongoing 3-4 days. Pt found to have R BPPV after seeing Dr. Madilyn Fireman. On assessment, pt with (+) R Dix-Hallpike in modified position for R posterior canalithiasis. (-) on L. Demonstrated to pt how to perform self Epley maneuver with good tolerance. Able to perform x 3 with improved symptoms. Educated pt on performing these at home 3x/day until symptom free. If not, pt is to call clinic for a follow up appointment to check other canaliths as needed. PT will keep chart open for 4 weeks to ensure no flare ups or recurrences.    Personal Factors and Comorbidities Age;Time since onset of injury/illness/exacerbation;Past/Current Experience    Examination-Activity Limitations Bed Mobility;Bend;Transfers    Examination-Participation Restrictions Cleaning     Stability/Clinical Decision Making Stable/Uncomplicated    Clinical Decision Making Low    Rehab Potential Good    PT Frequency  Other (comment)   Pt to follow up as needed for symptoms   PT Duration 4 weeks    PT Treatment/Interventions Canalith Repostioning;Neuromuscular re-education;Balance training;Therapeutic activities;Therapeutic exercise;Vestibular;Gait training    PT Next Visit Plan Reassess canaliths as needed. Initiate vestibular exercises and balance    PT Home Exercise Plan See pt instructions             Patient will benefit from skilled therapeutic intervention in order to improve the following deficits and impairments:  Dizziness, Decreased balance  Visit Diagnosis: Dizziness and giddiness  BPPV (benign paroxysmal positional vertigo), right     Problem List Patient Active Problem List   Diagnosis Date Noted   Acquired absence of breast 05/17/2020   Angioma of skin 05/17/2020   Insomnia 11/10/2019   Port-A-Cath in place 06/23/2019   Cancer of right female breast ([place]) 03/11/2019   Encounter for counseling 05/22/2018   Malignant neoplasm of upper-outer quadrant of right breast in female, estrogen receptor positive ([place]) 05/12/2018   Family history of breast cancer    Family history of ovarian cancer    Family history of brain cancer    Family history of melanoma    Acquired abnormality of left ear ossicles 03/27/2016   Right shoulder pain 03/13/2016   Family history of colon cancer 05/05/2015   Cataract 04/15/2013   Left lumbar radiculitis 03/27/2012   Osteoporosis 12/13/2010   Osteoarthritis of hip 12/12/2010   Essential hypertension, benign 11/17/2010   DIVERTICULOSIS OF COLON 09/01/2009   Hyperlipidemia 07/15/2009    Aubrey Voong April Gordy Levan, PT, DPT 06/02/2021, 10:10 AM  [facility] [place], [place], [address] Phone: [phone]   Fax:  [phone]  Name: Carie Kapuscinski MRN: [id number] Date of Birth: [birth year]

## 2021-06-07 ENCOUNTER — Telehealth: Payer: Self-pay | Admitting: Hematology and Oncology

## 2021-06-07 NOTE — Telephone Encounter (Signed)
Patient will be mailed an updated calendar.

## 2021-08-30 DIAGNOSIS — Z170 Estrogen receptor positive status [ER+]: Secondary | ICD-10-CM | POA: Diagnosis not present

## 2021-08-30 DIAGNOSIS — C50411 Malignant neoplasm of upper-outer quadrant of right female breast: Secondary | ICD-10-CM | POA: Diagnosis not present

## 2021-10-04 ENCOUNTER — Other Ambulatory Visit: Payer: Self-pay | Admitting: General Surgery

## 2021-10-04 DIAGNOSIS — Z853 Personal history of malignant neoplasm of breast: Secondary | ICD-10-CM | POA: Diagnosis not present

## 2021-10-04 DIAGNOSIS — L905 Scar conditions and fibrosis of skin: Secondary | ICD-10-CM | POA: Diagnosis not present

## 2021-10-04 DIAGNOSIS — L821 Other seborrheic keratosis: Secondary | ICD-10-CM | POA: Diagnosis not present

## 2021-10-04 DIAGNOSIS — D213 Benign neoplasm of connective and other soft tissue of thorax: Secondary | ICD-10-CM | POA: Diagnosis not present

## 2021-10-04 DIAGNOSIS — L987 Excessive and redundant skin and subcutaneous tissue: Secondary | ICD-10-CM | POA: Diagnosis not present

## 2021-11-22 ENCOUNTER — Other Ambulatory Visit: Payer: Self-pay | Admitting: *Deleted

## 2021-11-22 DIAGNOSIS — E7800 Pure hypercholesterolemia, unspecified: Secondary | ICD-10-CM

## 2021-11-22 MED ORDER — SIMVASTATIN 40 MG PO TABS: 40.0000 mg | ORAL_TABLET | Freq: Every day | ORAL | 1 refills | Status: DC

## 2021-11-29 ENCOUNTER — Ambulatory Visit (INDEPENDENT_AMBULATORY_CARE_PROVIDER_SITE_OTHER): Payer: Medicare Other | Admitting: Family Medicine

## 2021-11-29 ENCOUNTER — Encounter: Payer: Self-pay | Admitting: Family Medicine

## 2021-11-29 VITALS — BP 129/67 | HR 66 | Resp 18 | Ht 63.0 in | Wt 137.0 lb

## 2021-11-29 DIAGNOSIS — L309 Dermatitis, unspecified: Secondary | ICD-10-CM | POA: Diagnosis not present

## 2021-11-29 DIAGNOSIS — R21 Rash and other nonspecific skin eruption: Secondary | ICD-10-CM

## 2021-11-29 DIAGNOSIS — I10 Essential (primary) hypertension: Secondary | ICD-10-CM | POA: Diagnosis not present

## 2021-11-29 DIAGNOSIS — E7800 Pure hypercholesterolemia, unspecified: Secondary | ICD-10-CM

## 2021-11-29 DIAGNOSIS — M25552 Pain in left hip: Secondary | ICD-10-CM | POA: Diagnosis not present

## 2021-11-29 MED ORDER — AMLODIPINE BESYLATE 10 MG PO TABS: 10.0000 mg | ORAL_TABLET | Freq: Every day | ORAL | 3 refills | Status: DC

## 2021-11-29 MED ORDER — CLOBETASOL PROP EMOLLIENT BASE 0.05 % EX CREA: 1.0000 | TOPICAL_CREAM | Freq: Two times a day (BID) | CUTANEOUS | 1 refills | Status: DC | PRN

## 2021-11-29 NOTE — Progress Notes (Signed)
Established Patient Office Visit  Subjective   Patient ID: Samantha Cross, female    DOB: 10-20-1945  Age: 75 y.o. MRN: [id number]  Chief Complaint  Patient presents with   Hypertension    Follow up     HPI  Hypertension- Pt denies chest pain, SOB, dizziness, or heart palpitations.  Taking meds as directed w/o problems.  Denies medication side effects.    Hyperlipidemia - tolerating stating well with no myalgias or significant side effects.  Lab Results  Component Value Date   CHOL 195 08/17/2020   HDL 84 08/17/2020   LDLCALC 92 08/17/2020   TRIG 93 08/17/2020   CHOLHDL 2.3 08/17/2020    She is also been having some problems with her left hip.  She had x-rays back in 2012 showing mild to moderate degenerative changes of the left hip.  She had an injection done with Dr. Darene Lamer and says that she is really done well ever since then until this past year it started to become more bothersome.  Worse with prolonged sitting and with prolonged walking especially if she is not wearing good supportive shoe wear.    ROS    Objective:     BP 129/67   Pulse 66   Resp 18   Ht '5\' 3"'$  (1.6 m)   Wt 137 lb (62.1 kg)   SpO2 99%   BMI 24.27 kg/m    Physical Exam Vitals and nursing note reviewed.  Constitutional:      Appearance: She is well-developed.  HENT:     Head: Normocephalic and atraumatic.  Cardiovascular:     Rate and Rhythm: Normal rate and regular rhythm.     Heart sounds: Normal heart sounds.  Pulmonary:     Effort: Pulmonary effort is normal.     Breath sounds: Normal breath sounds.  Skin:    General: Skin is warm and dry.  Neurological:     Mental Status: She is alert and oriented to person, place, and time.  Psychiatric:        Behavior: Behavior normal.      No results found for any visits on 11/29/21.    The 10-year ASCVD risk score (Arnett DK, et al., 2019) is: 16.9%    Assessment & Plan:   Problem List Items Addressed This Visit        Cardiovascular and Mediastinum   Essential hypertension, benign - Primary    Well controlled. Continue current regimen. Follow up in 6 months.       Relevant Medications   amLODipine (NORVASC) 10 MG tablet   Other Relevant Orders   Lipid Panel w/reflex Direct LDL   COMPLETE METABOLIC PANEL WITH GFR   CBC     Other   Hyperlipidemia   Relevant Medications   amLODipine (NORVASC) 10 MG tablet   Other Relevant Orders   Lipid Panel w/reflex Direct LDL   COMPLETE METABOLIC PANEL WITH GFR   CBC   Other Visit Diagnoses     Left hip pain       Rash and nonspecific skin eruption       Relevant Medications   Clobetasol Prop Emollient Base (CLOBETASOL PROPIONATE E) 0.05 % emollient cream   Dermatitis           Left hip pain-  Encouraged her to get back in with him at any point this summer that she feels like she might want to do further work-up and maybe even a repeat injection.  Okay to  continue to use ibuprofen as needed.  Dermatitis - Would also like a refill on topical steroid cream to use for poison ivy and insect bites over the summer.  We had filled it previously several years ago.  Return in about 7 months (around 06/25/2022) for Hypertension.    Beatrice Lecher, MD

## 2021-11-29 NOTE — Assessment & Plan Note (Signed)
Well controlled. Continue current regimen. Follow up in  6 months.  

## 2021-11-30 ENCOUNTER — Encounter: Payer: Self-pay | Admitting: Hematology and Oncology

## 2021-11-30 LAB — LIPID PANEL W/REFLEX DIRECT LDL
Cholesterol: 199 mg/dL (ref <200)
HDL: 98 mg/dL (ref >=50)
LDL Cholesterol (Calc): 88 mg/dL (calc)
Non-HDL Cholesterol (Calc): 101 mg/dL (calc) (ref <130)
Total CHOL/HDL Ratio: 2 (calc) (ref <5.0)
Triglycerides: 45 mg/dL (ref <150)

## 2021-11-30 LAB — COMPLETE METABOLIC PANEL WITH GFR
AG Ratio: 1.7 (calc) (ref 1.0–2.5)
ALT: 11 U/L (ref 6–29)
AST: 14 U/L (ref 10–35)
Albumin: 4.5 g/dL (ref 3.6–5.1)
Alkaline phosphatase (APISO): 81 U/L (ref 37–153)
BUN: 9 mg/dL (ref 7–25)
CO2: 28 mmol/L (ref 20–32)
Calcium: 9.8 mg/dL (ref 8.6–10.4)
Chloride: 107 mmol/L (ref 98–110)
Creat: 0.62 mg/dL (ref 0.60–1.00)
Globulin: 2.7 g/dL (calc) (ref 1.9–3.7)
Glucose, Bld: 93 mg/dL (ref 65–99)
Potassium: 4 mmol/L (ref 3.5–5.3)
Sodium: 145 mmol/L (ref 135–146)
Total Bilirubin: 0.8 mg/dL (ref 0.2–1.2)
Total Protein: 7.2 g/dL (ref 6.1–8.1)
eGFR: 94 mL/min/{1.73_m2} (ref >=60)

## 2021-11-30 LAB — CBC
HCT: 46.4 % — ABNORMAL HIGH (ref 35.0–45.0)
Hemoglobin: 15.2 g/dL (ref 11.7–15.5)
MCH: 30.3 pg (ref 27.0–33.0)
MCHC: 32.8 g/dL (ref 32.0–36.0)
MCV: 92.6 fL (ref 80.0–100.0)
MPV: 10.8 fL (ref 7.5–12.5)
Platelets: 241 10*3/uL (ref 140–400)
RBC: 5.01 10*6/uL (ref 3.80–5.10)
RDW: 12.6 % (ref 11.0–15.0)
WBC: 10.4 10*3/uL (ref 3.8–10.8)

## 2021-11-30 NOTE — Progress Notes (Signed)
Marella, hemoglobin looks great no sign of anemia.  Cholesterol looks even better this year!  Great work!.  Metabolic panel looks great.

## 2021-12-01 ENCOUNTER — Other Ambulatory Visit: Payer: Self-pay | Admitting: Nurse Practitioner

## 2022-01-24 ENCOUNTER — Encounter: Payer: Self-pay | Admitting: General Practice

## 2022-02-16 ENCOUNTER — Other Ambulatory Visit: Payer: Self-pay | Admitting: *Deleted

## 2022-02-16 DIAGNOSIS — I10 Essential (primary) hypertension: Secondary | ICD-10-CM

## 2022-02-16 MED ORDER — AMLODIPINE BESYLATE 10 MG PO TABS: 10.0000 mg | ORAL_TABLET | Freq: Every day | ORAL | 3 refills | Status: DC

## 2022-05-12 ENCOUNTER — Other Ambulatory Visit: Payer: Self-pay | Admitting: Hematology and Oncology

## 2022-05-14 ENCOUNTER — Ambulatory Visit (INDEPENDENT_AMBULATORY_CARE_PROVIDER_SITE_OTHER): Payer: Medicare Other | Admitting: Family Medicine

## 2022-05-14 DIAGNOSIS — Z780 Asymptomatic menopausal state: Secondary | ICD-10-CM

## 2022-05-14 DIAGNOSIS — Z0000 Encounter for general adult medical examination without abnormal findings: Secondary | ICD-10-CM

## 2022-05-14 NOTE — Patient Instructions (Signed)
[place] Maintenance Summary and Written Plan of Care  Samantha Cross ,  Thank you for allowing me to perform your Medicare Annual Wellness Visit and for your ongoing commitment to your health.   Health Maintenance & Immunization History Health Maintenance  Topic Date Due   DTaP/Tdap/Td (2 - Td or Tdap) 02/28/2022   COVID-19 Vaccine (3 - Pfizer risk series) 05/30/2022 (Originally 10/06/2019)   Zoster Vaccines- Shingrix (1 of 2) 08/13/2022 (Originally 04/25/1965)   INFLUENZA VACCINE  09/02/2022 (Originally 01/02/2022)   Medicare Annual Wellness (AWV)  05/15/2023   COLONOSCOPY (Pts 45-51yr Insurance coverage will need to be confirmed)  10/05/2025   Pneumonia Vaccine 74 Years old  Completed   DEXA SCAN  Completed   Hepatitis C Screening  Completed   HPV VACCINES  Aged Out   Immunization History  Administered Date(s) Administered   Fluad Quad(high Dose 65+) 05/26/2019, 05/31/2021   PFIZER(Purple Top)SARS-COV-2 Vaccination 08/17/2019, 09/08/2019   PNEUMOCOCCAL CONJUGATE-20 05/31/2021   Tdap 02/29/2012   Zoster, Live 03/05/2012    These are the patient goals that we discussed:  Goals Addressed               This Visit's Progress     Patient Stated (pt-stated)        Patient stated that she would like to loose 10 lbs.         This is a list of Health Maintenance Items that are overdue or due now: Health Maintenance Due  Topic Date Due   DTaP/Tdap/Td (2 - Td or Tdap) 02/28/2022   Influenza vaccine Td vaccine Shingles vaccine  Orders/Referrals Placed Today: Orders Placed This Encounter  Procedures   DEXAScan    Standing Status:   Future    Standing Expiration Date:   05/15/2023    Scheduling Instructions:     Please call patient to schedule.    Order Specific Question:   Reason for exam:    Answer:   post menopausal    Order Specific Question:   Preferred imaging location?    Answer:   [facility]  (Contact our referral  department at [phone] you have not spoken with someone about your referral appointment within the next 5 days)    Follow-up Plan Follow-up with MHali Marry MD as planned Schedule tetanus vaccine and influenza vaccine at the pharmacy. Medicare wellness visit in one year. Patient will access AVS on my chart.      Health Maintenance, Female Adopting a healthy lifestyle and getting preventive care are important in promoting health and wellness. Ask your health care provider about: The right schedule for you to have regular tests and exams. Things you can do on your own to prevent diseases and keep yourself healthy. What should I know about diet, weight, and exercise? Eat a healthy diet  Eat a diet that includes plenty of vegetables, fruits, low-fat dairy products, and lean protein. Do not eat a lot of foods that are high in solid fats, added sugars, or sodium. Maintain a healthy weight Body mass index (BMI) is used to identify weight problems. It estimates body fat based on height and weight. Your health care provider can help determine your BMI and help you achieve or maintain a healthy weight. Get regular exercise Get regular exercise. This is one of the most important things you can do for your health. Most adults should: Exercise for at least 150 minutes each week. The exercise should increase your heart rate  and make you sweat (moderate-intensity exercise). Do strengthening exercises at least twice a week. This is in addition to the moderate-intensity exercise. Spend less time sitting. Even light physical activity can be beneficial. Watch cholesterol and blood lipids Have your blood tested for lipids and cholesterol at 74 years of age, then have this test every 5 years. Have your cholesterol levels checked more often if: Your lipid or cholesterol levels are high. You are older than 74 years of age. You are at high risk for heart disease. What should I know about  cancer screening? Depending on your health history and family history, you may need to have cancer screening at various ages. This may include screening for: Breast cancer. Cervical cancer. Colorectal cancer. Skin cancer. Lung cancer. What should I know about heart disease, diabetes, and high blood pressure? Blood pressure and heart disease High blood pressure causes heart disease and increases the risk of stroke. This is more likely to develop in people who have high blood pressure readings or are overweight. Have your blood pressure checked: Every 3-5 years if you are 47-33 years of age. Every year if you are 30 years old or older. Diabetes Have regular diabetes screenings. This checks your fasting blood sugar level. Have the screening done: Once every three years after age 56 if you are at a normal weight and have a low risk for diabetes. More often and at a younger age if you are overweight or have a high risk for diabetes. What should I know about preventing infection? Hepatitis B If you have a higher risk for hepatitis B, you should be screened for this virus. Talk with your health care provider to find out if you are at risk for hepatitis B infection. Hepatitis C Testing is recommended for: Everyone born from [birth year] through 1963. Anyone with known risk factors for hepatitis C. Sexually transmitted infections (STIs) Get screened for STIs, including gonorrhea and chlamydia, if: You are sexually active and are younger than 74 years of age. You are older than 74 years of age and your health care provider tells you that you are at risk for this type of infection. Your sexual activity has changed since you were last screened, and you are at increased risk for chlamydia or gonorrhea. Ask your health care provider if you are at risk. Ask your health care provider about whether you are at high risk for HIV. Your health care provider may recommend a prescription medicine to help prevent HIV  infection. If you choose to take medicine to prevent HIV, you should first get tested for HIV. You should then be tested every 3 months for as long as you are taking the medicine. Pregnancy If you are about to stop having your period (premenopausal) and you may become pregnant, seek counseling before you get pregnant. Take 400 to 800 micrograms (mcg) of folic acid every day if you become pregnant. Ask for birth control (contraception) if you want to prevent pregnancy. Osteoporosis and menopause Osteoporosis is a disease in which the bones lose minerals and strength with aging. This can result in bone fractures. If you are 61 years old or older, or if you are at risk for osteoporosis and fractures, ask your health care provider if you should: Be screened for bone loss. Take a calcium or vitamin D supplement to lower your risk of fractures. Be given hormone replacement therapy (HRT) to treat symptoms of menopause. Follow these instructions at home: Alcohol use Do not drink alcohol if: Your  health care provider tells you not to drink. You are pregnant, may be pregnant, or are planning to become pregnant. If you drink alcohol: Limit how much you have to: 0-1 drink a day. Know how much alcohol is in your drink. In the [place] one drink equals one 12 oz bottle of beer (355 mL), one 5 oz glass of wine (148 mL), or one 1 oz glass of hard liquor (44 mL). Lifestyle Do not use any products that contain nicotine or tobacco. These products include cigarettes, chewing tobacco, and vaping devices, such as e-cigarettes. If you need help quitting, ask your health care provider. Do not use street drugs. Do not share needles. Ask your health care provider for help if you need support or information about quitting drugs. General instructions Schedule regular health, dental, and eye exams. Stay current with your vaccines. Tell your health care provider if: You often feel depressed. You have ever been abused  or do not feel safe at home. Summary Adopting a healthy lifestyle and getting preventive care are important in promoting health and wellness. Follow your health care provider's instructions about healthy diet, exercising, and getting tested or screened for diseases. Follow your health care provider's instructions on monitoring your cholesterol and blood pressure. This information is not intended to replace advice given to you by your health care provider. Make sure you discuss any questions you have with your health care provider. Document Revised: 10/10/2020 Document Reviewed: 10/10/2020 Elsevier Patient Education  [place].

## 2022-05-14 NOTE — Progress Notes (Signed)
MEDICARE ANNUAL WELLNESS VISIT  05/14/2022  Telephone Visit Disclaimer This Medicare AWV was conducted by telephone due to national recommendations for restrictions regarding the COVID-19 Pandemic (e.g. social distancing).  I verified, using two identifiers, that I am speaking with Samantha Cross or their authorized healthcare agent. I discussed the limitations, risks, security, and privacy concerns of performing an evaluation and management service by telephone and the potential availability of an in-person appointment in the future. The patient expressed understanding and agreed to proceed.  Location of Patient: Home Location of Provider (nurse):  In the office.  Subjective:    Samantha Cross is a 76 y.o. female patient of Metheney, Rene Kocher, MD who had a Medicare Annual Wellness Visit today via telephone. Samantha Cross is Retired and lives with their spouse. she has 2 children. she reports that she is socially active and does interact with friends/family regularly. she is minimally physically active and enjoys kayaking, hunting and being outdoors.  Patient Care Team: [name], MD as PCP - General (Family Medicine) [name], RN as Oncology Nurse Navigator [name], RN as Oncology Nurse Navigator     05/14/2022    9:56 AM 06/02/2021    9:33 AM 11/06/2019    7:29 AM 10/30/2019    2:01 PM 09/15/2019   10:06 AM 08/11/2019    2:06 PM 06/23/2019   11:02 AM  Advanced Directives  Does Patient Have a Medical Advance Directive? No No No No No No No  Would patient like information on creating a medical advance directive? No - Patient declined No - Patient declined No - Patient declined Yes (MAU/Ambulatory/Procedural Areas - Information given) No - Patient declined No - Patient declined No - Patient declined    Hospital Utilization Over the Past 12 Months: # of hospitalizations or ER visits: 0 # of surgeries: 0  Review of Systems    Patient  reports that her overall health is unchanged compared to last year.  History obtained from chart review and the patient  Patient Reported Readings (BP, Pulse, CBG, Weight, etc) none  Pain Assessment Pain : No/denies pain     Current Medications & Allergies (verified) Allergies as of 05/14/2022       Reactions   Lisinopril    Tongue swelling   Alendronate [alendronate] Other (See Comments)   Chest pain, palpitations.    Hydroxyzine Other (See Comments)   Made her feel weird        Medication List        Accurate as of May 14, 2022 10:12 AM. If you have any questions, ask your nurse or doctor.          amLODipine 10 MG tablet Commonly known as: NORVASC Take 1 tablet (10 mg total) by mouth daily.   Clobetasol Prop Emollient Base 0.05 % emollient cream Commonly known as: Clobetasol Propionate E Apply 1 Application topically 2 (two) times daily as needed (rash).   Hair Skin and Nails Formula Tabs Take by mouth.   letrozole 2.5 MG tablet Commonly known as: FEMARA TAKE 1 TABLET(2.5 MG) BY MOUTH DAILY   OVER THE COUNTER MEDICATION Take 2 capsules by mouth 2 (two) times daily as needed (Supplement). [employer]   simvastatin 40 MG tablet Commonly known as: ZOCOR Take 1 tablet (40 mg total) by mouth at bedtime.        History (reviewed): Past Medical History:  Diagnosis Date   Arthritis    Diverticulosis  Family history of brain cancer    Family history of breast cancer    Family history of colon cancer    Family history of melanoma    Family history of ovarian cancer    Hemorrhoids    Hyperlipidemia    Hypertension    Postmenopausal    rt breast ca dx'd 05/2018   Past Surgical History:  Procedure Laterality Date   ABDOMINAL HYSTERECTOMY     without BSO/heavy periods   BLADDER SURGERY  when she was in her 30's   Her bladder was punctured during her hysterectomy and needed to be repaired.    BREAST LUMPECTOMY WITH RADIOACTIVE SEED  AND SENTINEL LYMPH NODE BIOPSY Right 12/24/2018   Procedure: RIGHT BREAST LUMPECTOMY WITH RADIOACTIVE SEED AND SENTINEL LYMPH NODE BIOPSY;  Surgeon: Jovita Kussmaul, MD;  Location: [facility];  Service: General;  Laterality: Right;   CLEFT PALATE REPAIR     MIDDLE EAR SURGERY     left   PORT-A-CATH REMOVAL Left 11/06/2019   Procedure: REMOVAL OF PORT;  Surgeon: Jovita Kussmaul, MD;  Location: [facility];  Service: General;  Laterality: Left;   PORTACATH PLACEMENT Left 03/11/2019   Procedure: INSERTION PORT-A-CATH POSSILBLE ULTRASOUND;  Surgeon: Jovita Kussmaul, MD;  Location: [facility];  Service: General;  Laterality: Left;   TOTAL MASTECTOMY Bilateral 03/11/2019   Procedure: BILATERAL  MASTECTOMIES;  Surgeon: Jovita Kussmaul, MD;  Location: [facility];  Service: General;  Laterality: Bilateral;   Family History  Problem Relation Age of Onset   Ovarian cancer Mother        dx under 81; d. 54   Colon cancer Mother 45   Cancer Brother        brain tumor/gliobastoma, multiforme   Melanoma Brother    Kidney cancer Brother 42   Cancer Brother        renal cell carcinoma, colon ca   Heart attack Father 73       died/CVA   Heart attack Brother 80   Hyperlipidemia Other    Breast cancer Sister 37   Cancer Maternal Aunt        NOS   Colon cancer Maternal Uncle        dx in his 34s   Colon cancer Maternal Grandmother        dx over 38   Social History   Socioeconomic History   Marital status: Married    Spouse name: Elta Guadeloupe   Number of children: 2   Years of education: 14   Highest education level: Associate degree: academic program  Occupational History   Occupation: has a farm.    Occupation: Retired  Tobacco Use   Smoking status: Former    Types: Cigarettes    Quit date: 06/04/1998    Years since quitting: 23.9   Smokeless tobacco: Never  Vaping Use   Vaping Use: Never used  Substance and Sexual Activity   Alcohol use: Not Currently    Comment: social   Drug  use: No   Sexual activity: Not on file    Comment: married, 2 grown children, 2 stepkids, no regular exercise, 3 beagles, working PT at daycare.  Other Topics Concern   Not on file  Social History Narrative   Lives with her husband. 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 She enjoys kayaking, hunting and being outdoors.   Social Determinants of Health   Financial Resource Strain: Low Risk  (05/14/2022)  Overall Financial Resource Strain (CARDIA)    Difficulty of Paying Living Expenses: Not hard at all  Food Insecurity: No Food Insecurity (05/14/2022)   Hunger Vital Sign    Worried About Running Out of Food in the Last Year: Never true    Ran Out of Food in the Last Year: Never true  Transportation Needs: No Transportation Needs (05/14/2022)   PRAPARE - Hydrologist (Medical): No    Lack of Transportation (Non-Medical): No  Physical Activity: Inactive (05/14/2022)   Exercise Vital Sign    Days of Exercise per Week: 0 days    Minutes of Exercise per Session: 0 min  Stress: No Stress Concern Present (05/14/2022)   [place]    Feeling of Stress : Not at all  Social Connections: Moderately Isolated (05/14/2022)   Social Connection and Isolation Panel [NHANES]    Frequency of Communication with Friends and Family: More than three times a week    Frequency of Social Gatherings with Friends and Family: More than three times a week    Attends Religious Services: Never    Marine scientist or Organizations: No    Attends Archivist Meetings: Never    Marital Status: Married    Activities of Daily Living    05/14/2022   10:01 AM  In your present state of health, do you have any difficulty performing the following activities:  Hearing? 0  Comment feels like she has some hearing loss in left ear.  Vision? 0  Comment wears glasses  Difficulty concentrating  or making decisions? 0  Walking or climbing stairs? 0  Dressing or bathing? 0  Doing errands, shopping? 0  Preparing Food and eating ? N  Using the Toilet? N  In the past six months, have you accidently leaked urine? N  Do you have problems with loss of bowel control? N  Managing your Medications? N  Managing your Finances? N  Housekeeping or managing your Housekeeping? N    Patient Education/ Literacy How often do you need to have someone help you when you read instructions, pamphlets, or other written materials from your doctor or pharmacy?: 1 - Never What is the last grade level you completed in school?: 2 years of college  Exercise Current Exercise Habits: The patient does not participate in regular exercise at present, Exercise limited by: None identified  Diet Patient reports consuming 1 meals a day and 1 snack(s) a day Patient reports that her primary diet is: Regular Patient reports that she does have regular access to food.   Depression Screen    05/14/2022    9:56 AM 11/29/2021    8:43 AM 08/17/2020    8:48 AM 11/10/2019    9:22 AM 04/09/2018    8:57 AM 10/09/2016    8:39 AM 11/08/2015    9:02 AM  PHQ 2/9 Scores  PHQ - 2 Score 0 0 1 6 0 0 0  PHQ- 9 Score    9        Fall Risk    05/14/2022    9:56 AM 11/29/2021    8:43 AM 05/31/2021    8:44 AM 08/17/2020    8:48 AM 11/10/2019    9:32 AM  Fall Risk   Falls in the past year? 0 0 0 0 0  Number falls in past yr: 0 0 0 0   Injury with Fall? 0 0 0 0  Risk for fall due to : No Fall Risks No Fall Risks No Fall Risks No Fall Risks   Follow up Falls evaluation completed Falls prevention discussed;Falls evaluation completed Falls prevention discussed;Falls evaluation completed Falls evaluation completed      Objective:  Samantha Cross seemed alert and oriented and she participated appropriately during our telephone visit.  Blood Pressure Weight BMI  BP Readings from Last 3 Encounters:  11/29/21 129/67   05/31/21 134/68  05/25/21 128/74   Wt Readings from Last 3 Encounters:  11/29/21 137 lb (62.1 kg)  05/31/21 134 lb (60.8 kg)  05/25/21 134 lb 6.4 oz (61 kg)   BMI Readings from Last 1 Encounters:  11/29/21 24.27 kg/m    *Unable to obtain current vital signs, weight, and BMI due to telephone visit type  Hearing/Vision  Samantha Cross did not seem to have difficulty with hearing/understanding during the telephone conversation Reports that she has not had a formal eye exam by an eye care professional within the past year Reports that she has not had a formal hearing evaluation within the past year *Unable to fully assess hearing and vision during telephone visit type  Cognitive Function:    05/14/2022   10:04 AM  6CIT Screen  What Year? 0 points  What month? 0 points  What time? 0 points  Count back from 20 0 points  Months in reverse 2 points  Repeat phrase 2 points  Total Score 4 points   (Normal:0-7, Significant for Dysfunction: >8)  Normal Cognitive Function Screening: Yes   Immunization & Health Maintenance Record Immunization History  Administered Date(s) Administered   Fluad Quad(high Dose 65+) 05/26/2019, 05/31/2021   PFIZER(Purple Top)SARS-COV-2 Vaccination 08/17/2019, 09/08/2019   PNEUMOCOCCAL CONJUGATE-20 05/31/2021   Tdap 02/29/2012   Zoster, Live 03/05/2012    Health Maintenance  Topic Date Due   DTaP/Tdap/Td (2 - Td or Tdap) 02/28/2022   COVID-19 Vaccine (3 - Pfizer risk series) 05/30/2022 (Originally 10/06/2019)   Zoster Vaccines- Shingrix (1 of 2) 08/13/2022 (Originally 04/25/1965)   INFLUENZA VACCINE  09/02/2022 (Originally 01/02/2022)   Medicare Annual Wellness (AWV)  05/15/2023   COLONOSCOPY (Pts 45-77yr Insurance coverage will need to be confirmed)  10/05/2025   Pneumonia Vaccine 76 Years old  Completed   DEXA SCAN  Completed   Hepatitis C Screening  Completed   HPV VACCINES  Aged Out       Assessment  This is a routine wellness examination for  [employer]  Health Maintenance: Due or Overdue Health Maintenance Due  Topic Date Due   DTaP/Tdap/Td (2 - Td or Tdap) 02/28/2022     DKizzie Fantasiadoes not need a referral for Community Assistance: Care Management:   no Social Work:    no Prescription Assistance:  no Nutrition/Diabetes Education:  no   Plan:  Personalized Goals  Goals Addressed               This Visit's Progress     Patient Stated (pt-stated)        Patient stated that she would like to loose 10 lbs.       Personalized Health Maintenance & Screening Recommendations  Influenza vaccine Td vaccine Shingles vaccine  Lung Cancer Screening Recommended: no (Low Dose CT Chest recommended if Age 465-80years, 30 pack-year currently smoking OR have quit w/in past 15 years) Hepatitis C Screening recommended: no HIV Screening recommended: no  Advanced Directives: Written information was not prepared per patient's request.  Referrals & Orders  Orders Placed This Encounter  Procedures   [place]    Follow-up Plan Follow-up with [name], MD as planned Schedule tetanus vaccine and influenza vaccine at the pharmacy. Medicare wellness visit in one year. Patient will access AVS on my chart.   I have personally reviewed and noted the following in the patient's chart:   Medical and social history Use of alcohol, tobacco or illicit drugs  Current medications and supplements Functional ability and status Nutritional status Physical activity Advanced directives List of other physicians Hospitalizations, surgeries, and ER visits in previous 12 months Vitals Screenings to include cognitive, depression, and falls Referrals and appointments  In addition, I have reviewed and discussed with Samantha Cross certain preventive protocols, quality metrics, and best practice recommendations. A written personalized care plan for preventive services as well as  general preventive health recommendations is available and can be mailed to the patient at her request.      Tinnie Gens, RN BSN  05/14/2022

## 2022-05-24 NOTE — Progress Notes (Signed)
Patient Care Team: [name], MD as PCP - General (Family Medicine) [name], RN as Oncology Nurse Navigator [name], RN as Oncology Nurse Navigator  DIAGNOSIS:  Encounter Diagnosis  Name Primary?   Malignant neoplasm of upper-outer quadrant of right breast in female, estrogen receptor positive ([facility]) Yes    SUMMARY OF ONCOLOGIC HISTORY: Oncology History  Malignant neoplasm of upper-outer quadrant of right breast in female, estrogen receptor positive ([place])  04/23/2018 Initial Diagnosis   Palpable lump in right breast UOQ, mammogram revealed 2 separate masses.  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  The Multi-Gene Panel offered by [facility] includes sequencing and/or deletion duplication testing of the following 85 genes: AIP, ALK, APC, ATM, AXIN2,BAP1,  BARD1, BLM, BMPR1A, BRCA1, BRCA2, BRIP1, CASR, CDC73, CDH1, CDK4, CDKN1B, CDKN1C, CDKN2A (p14ARF), CDKN2A (p16INK4a), CEBPA, CHEK2, CTNNA1, DICER1, DIS3L2, EGFR (c.2369C>T, p.Thr790Met variant only), EPCAM (Deletion/duplication testing only), FH, FLCN, GATA2, GPC3, GREM1 (Promoter region deletion/duplication testing only), HOXB13 (c.251G>A, p.Gly84Glu), HRAS, KIT, MAX, MEN1, MET, MITF (c.952G>A, p.Glu318Lys variant only), MLH1, MSH2, MSH3, MSH6, MUTYH, NBN, NF1, NF2, NTHL1, PALB2, PDGFRA, PHOX2B, PMS2, POLD1, POLE, POT1, PRKAR1A, PTCH1,  PTEN, RAD50, RAD51C, RAD51D, RB1, RECQL4, RET, RNF43, RUNX1, SDHAF2, SDHA (sequence changes only), SDHB, SDHC, SDHD, SMAD4, SMARCA4, SMARCB1, SMARCE1, STK11, SUFU, TERC, TERT, TMEM127, TP53, TSC1, TSC2, VHL, WRN and WT1.     12/24/2018 Surgery   Right lumpectomy [name]): residual invasive carcinoma, clear margins, and 2/3 lymph nodes positive for carcinoma.    01/14/2019 Cancer Staging   Staging form: Breast, AJCC 8th Edition - Pathologic stage from 01/14/2019: No Stage Recommended (ypT2, pN1a, cM0, G2, ER+, PR-, HER2-) - Signed by [name], MD on 01/14/2019   01/22/2019 Miscellaneous   MammaPrint: High risk luminal type B   03/11/2019 Surgery   Bilateral mastectomies [name]):   Left breast: benign tissue and two negative lymph nodes Right breast: residual IDC 0.5 cm, clear margins, 2 lymph nodes negativeER 95%, PR 0%, HER-2 -1+, Ki-67 10%   04/14/2019 - 08/25/2019 Chemotherapy   Patient is on Treatment Plan : Breast AC q21 Days / PACLitaxel q7d     09/22/2019 - 10/30/2019 Radiation Therapy   Adjuvant radiation therapy   11/2019 -  Anti-estrogen oral therapy   Letrozole 2.5 mg daily     CHIEF COMPLIANT:  Follow-up of right breast cancer on Letrozole   INTERVAL HISTORY: Samantha Cross is a 76 y.o. with above-mentioned history of right breast cancer treated with neoadjuvant letrozole, lumpectomy followed by bilateral mastectomies, adjuvant chemotherapy, radiation, and is currently on antiestrogen therapy with Letrozole. She presents to the clinic today for follow-up. She reports lower back and hip pain.   ALLERGIES:  is allergic to lisinopril, alendronate [alendronate], and hydroxyzine.  MEDICATIONS:  Current Outpatient Medications  Medication Sig Dispense Refill   amLODipine (NORVASC) 10 MG tablet Take 1 tablet (10 mg total) by  mouth daily. 90 tablet 3   Clobetasol Prop Emollient Base (CLOBETASOL PROPIONATE E) 0.05 % emollient cream Apply 1 Application topically 2 (two)  times daily as needed (rash). 30 g 1   letrozole (FEMARA) 2.5 MG tablet TAKE 1 TABLET(2.5 MG) BY MOUTH DAILY 90 tablet 0   Multiple Vitamins-Minerals (HAIR SKIN AND NAILS FORMULA) TABS Take by mouth.     OVER THE COUNTER MEDICATION Take 2 capsules by mouth 2 (two) times daily as needed (Supplement). [employer]     simvastatin (ZOCOR) 40 MG tablet Take 1 tablet (40 mg total) by mouth at bedtime. 90 tablet 1   No current facility-administered medications for this visit.    PHYSICAL EXAMINATION: ECOG PERFORMANCE STATUS: 1 - Symptomatic but completely ambulatory  Vitals:   05/25/22 0939  BP: 131/75  Pulse: 65  Resp: 16  Temp: 97.9 F (36.6 C)  SpO2: 98%   Filed Weights   05/25/22 0939  Weight: 141 lb 4.8 oz (64.1 kg)      LABORATORY DATA:  I have reviewed the data as listed    Latest Ref Rng & Units 11/29/2021   12:00 AM 08/17/2020   12:00 AM 08/25/2019    2:05 PM  CMP  Glucose 65 - 99 mg/dL 93  102  106   BUN 7 - 25 mg/dL _0 Creatinine 0.60 - 1.00 mg/dL 0.62  0.64  0.73   Sodium 135 - 146 mmol/L 145  142  143   Potassium 3.5 - 5.3 mmol/L 4.0  4.0  3.4   Chloride 98 - 110 mmol/L 107  105  108   CO2 20 - 32 mmol/L _1 Calcium 8.6 - 10.4 mg/dL 9.8  9.8  8.9   Total Protein 6.1 - 8.1 g/dL 7.2  7.3  6.4   Total Bilirubin 0.2 - 1.2 mg/dL 0.8  0.8  0.6   Alkaline Phos 38 - 126 U/L   67   AST 10 - 35 U/L _2 ALT 6 - 29 U/L _3 Lab Results  Component Value Date   WBC 10.4 11/29/2021   HGB 15.2 11/29/2021   HCT 46.4 (H) 11/29/2021   MCV 92.6 11/29/2021   PLT 241 11/29/2021   NEUTROABS 4.1 08/25/2019    ASSESSMENT & PLAN:  Malignant neoplasm of upper-outer quadrant of right breast in female, estrogen receptor positive ([place]) 04/23/2018 palpable lump in right breast UOQ, mammogram revealed 2 separate masses.  Larger at 12 o'clock position 5.3 cm and smaller at 10 o'clock position 2 cm, biopsy revealed invasive lobular carcinoma  grade 2-3 with LCIS intermediate grade, ER 95%, PR 0%, Ki-67 10%, HER-2 negative IHC 1+ T3 N0 stage 3A   MRI breast 05/19/2018: 5.7 x 4.9 x 3.3 cm mass central right breast, no lymph nodes 05/22/2018: CT CAP: Right renal lesion 06/06/2017: MRI abdomen: Complex cystic lesion right kidney, probably benign but requires follow-up in 6 months   Right lumpectomy: Residual invasive ductal carcinoma 3.8 cm, margins negative, 2/3 lymph nodes positive, ER 95%, PR 0%, HER-2 negative, Ki-67 10%   Treatment plan: 1.  Neoadjuvant antiestrogen therapy 05/12/2018-July 2020 3.  12/24/2018 Right lumpectomy: Residual invasive ductal carcinoma 3.8 cm, margins negative, 2/3 lymph nodes positive, ER 95%, PR 0%, HER-2 negative, Ki-67 10%  03/11/2019 bilateral mastectomies [name]):   Left breast: benign tissue and two negative lymph nodes  Right breast: residual IDC 0.5 cm , clear margins, 2 lymph nodes negative, ER 95%, PR 0%, HER-2 -1+, Ki-67 10%   4.  MammaPrint high risk: Adjuvant chemotherapy with dose dense Adriamycin and Cytoxan followed by Taxol completed 08/25/2019 5. adjuvant radiation therapy 09/22/19-10/28/19 5.  Followed by adjuvant antiestrogen therapy with letrozole ----------------------------------------------------------------------------------------------------------------------------------------------- clinical trial SWOG 1714 ---------------------------------------------------------------------------------------------------------------------------------- Letrozole Toxicities: Tolerating it extremely well. Hip discomfort: Encouraged her to do some stretching   Breast Cancer Surveillance: 1. Breast exam 05/25/2022: Benign 2. she had bilateral mastectomies therefore there is no role of mammograms   Low back pain: Going on for the last month or so.  I would like to obtain a bone scan to further.  Telephone visit after that to discuss results.  If there is no evidence of metastatic disease and we will  refer her to orthopedics (patient has an orthopedic surgeon in [place] that she has seen before). RTC in 1 year      Orders Placed This Encounter  Procedures   NM Bone Scan Whole Body    Standing Status:   Future    Standing Expiration Date:   05/25/2023    Order Specific Question:   If indicated for the ordered procedure, I authorize the administration of a radiopharmaceutical per Radiology protocol    Answer:   Yes    Order Specific Question:   Preferred imaging location?    Answer:   [facility]   The patient has a good understanding of the overall plan. she agrees with it. she will call with any problems that may develop before the next visit here. Total time spent: 30 mins including face to face time and time spent for planning, charting and co-ordination of care   Harriette Ohara, MD 05/25/22    I Gardiner Coins am acting as a Education administrator for [employer]  I have reviewed the above documentation for accuracy and completeness, and I agree with the above.

## 2022-05-25 ENCOUNTER — Inpatient Hospital Stay: Payer: Medicare Other | Attending: Hematology and Oncology | Admitting: Hematology and Oncology

## 2022-05-25 VITALS — BP 131/75 | HR 65 | Temp 97.9°F | Resp 16 | Ht 63.0 in | Wt 141.3 lb

## 2022-05-25 DIAGNOSIS — C50411 Malignant neoplasm of upper-outer quadrant of right female breast: Secondary | ICD-10-CM | POA: Insufficient documentation

## 2022-05-25 DIAGNOSIS — Z9013 Acquired absence of bilateral breasts and nipples: Secondary | ICD-10-CM | POA: Diagnosis not present

## 2022-05-25 DIAGNOSIS — Z79811 Long term (current) use of aromatase inhibitors: Secondary | ICD-10-CM | POA: Insufficient documentation

## 2022-05-25 DIAGNOSIS — Z923 Personal history of irradiation: Secondary | ICD-10-CM | POA: Diagnosis not present

## 2022-05-25 DIAGNOSIS — Z79899 Other long term (current) drug therapy: Secondary | ICD-10-CM | POA: Insufficient documentation

## 2022-05-25 DIAGNOSIS — Z170 Estrogen receptor positive status [ER+]: Secondary | ICD-10-CM | POA: Diagnosis not present

## 2022-05-25 DIAGNOSIS — Z9221 Personal history of antineoplastic chemotherapy: Secondary | ICD-10-CM | POA: Diagnosis not present

## 2022-05-25 NOTE — Assessment & Plan Note (Addendum)
04/23/2018 palpable lump in right breast UOQ, mammogram revealed 2 separate masses.  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  Neoadjuvant antiestrogen therapy 05/12/2018-July 2020 3.  12/24/2018 Right lumpectomy: Residual invasive ductal carcinoma 3.8 cm, margins negative, 2/3 lymph nodes positive, ER 95%, PR 0%, HER-2 negative, Ki-67 10%  03/11/2019 bilateral mastectomies ([facility]):   Left breast: benign tissue and two negative lymph nodes Right breast: residual IDC 0.5 cm , clear margins, 2 lymph nodes negative, ER 95%, PR 0%, HER-2 -1+, Ki-67 10%   4.  MammaPrint high risk: Adjuvant chemotherapy with dose dense Adriamycin and Cytoxan followed by Taxol completed 08/25/2019 5. adjuvant radiation therapy 09/22/19-10/28/19 5.  Followed by adjuvant antiestrogen therapy with letrozole ----------------------------------------------------------------------------------------------------------------------------------------------- clinical trial SWOG 1714 ---------------------------------------------------------------------------------------------------------------------------------- Letrozole Toxicities: Tolerating it extremely well. Hip discomfort: Encouraged her to do some stretching   Breast Cancer Surveillance: 1. Breast exam 05/25/2022: Benign 2. she had bilateral mastectomies therefore there is no role of mammograms   Low  back pain: Going on for the last month or so.  I would like to obtain a bone scan to further.  Telephone visit after that to discuss results.  If there is no evidence of metastatic disease and we will refer her to orthopedics (patient has an orthopedic surgeon in [place] that she has seen before). RTC in 1 year   

## 2022-05-30 ENCOUNTER — Ambulatory Visit (INDEPENDENT_AMBULATORY_CARE_PROVIDER_SITE_OTHER): Payer: Medicare Other

## 2022-05-30 DIAGNOSIS — Z0000 Encounter for general adult medical examination without abnormal findings: Secondary | ICD-10-CM

## 2022-05-30 DIAGNOSIS — Z780 Asymptomatic menopausal state: Secondary | ICD-10-CM

## 2022-05-30 DIAGNOSIS — M810 Age-related osteoporosis without current pathological fracture: Secondary | ICD-10-CM | POA: Diagnosis not present

## 2022-05-30 NOTE — Progress Notes (Signed)
Hi Rhyen, your bone density shows a T-score of -4.0 this is consistent with very thin bones called osteoporosis.   The current recommendation for osteoporosis treatment includes:   #1 calcium-total of 1200 mg of calcium daily.  If you eat a very calcium rich diet you may be able to obtain that without a supplement.  If not, then I recommend calcium 500 mg twice a day.  There are several products over-the-counter such as Caltrate D and Viactiv chews which are great options that contain calcium and vitamin D. #2 vitamin D-recommend 800 international units daily. #3 exercise-recommend 30 minutes of weightbearing exercise 3 days a week.  Resistance training ,such as doing bands and light weights, can be particularly helpful. #4 medication-if you are not currently on a bone builder, also called a bisphosphonate, then this has been shown to be very helpful in maintaining bone strength, preventing further thinning of the bones, and reducing your risk for fractures.  I would highly recommend that you consider starting 1 of these medications.  If you are okay with that then please let us know and we will send one to your pharmacy.  If you would like to discuss further we are happy to make an appointment for you so that we can go over options for treatment.

## 2022-06-02 NOTE — Progress Notes (Incomplete)
HEMATOLOGY-ONCOLOGY TELEPHONE VISIT PROGRESS NOTE  I connected with our patient on 06/02/22 at 12:15 PM EST by telephone and verified that I am speaking with the correct person using two identifiers.  I discussed the limitations, risks, security and privacy concerns of performing an evaluation and management service by telephone and the availability of in person appointments.  I also discussed with the patient that there may be a patient responsible charge related to this service. The patient expressed understanding and agreed to proceed.   History of Present Illness: Samantha Cross is a 76 y.o history of right breast cancer. treated with neoadjuvant letrozole, lumpectomy followed by bilateral mastectomies, adjuvant chemotherapy, radiation, and is currently on antiestrogen therapy with Letrozole. She presents to the clinic via phone visit to discuss the results of bone scan.    Oncology History  Malignant neoplasm of upper-outer quadrant of right breast in female, estrogen receptor positive ([place])  04/23/2018 Initial Diagnosis   Palpable lump in right breast UOQ, mammogram revealed 2 separate masses.  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  The Multi-Gene Panel offered by [facility] includes sequencing and/or deletion duplication testing of the following 85 genes: AIP, ALK, APC, ATM, AXIN2,BAP1,  BARD1, BLM,  BMPR1A, BRCA1, BRCA2, BRIP1, CASR, CDC73, CDH1, CDK4, CDKN1B, CDKN1C, CDKN2A (p14ARF), CDKN2A (p16INK4a), CEBPA, CHEK2, CTNNA1, DICER1, DIS3L2, EGFR (c.2369C>T, p.Thr790Met variant only), EPCAM (Deletion/duplication testing only), FH, FLCN, GATA2, GPC3, GREM1 (Promoter region deletion/duplication testing only), HOXB13 (c.251G>A, p.Gly84Glu), HRAS, KIT, MAX, MEN1, MET, MITF (c.952G>A, p.Glu318Lys variant only), MLH1, MSH2, MSH3, MSH6, MUTYH, NBN, NF1, NF2, NTHL1, PALB2, PDGFRA, PHOX2B, PMS2, POLD1, POLE, POT1, PRKAR1A, PTCH1, PTEN, RAD50, RAD51C, RAD51D, RB1, RECQL4, RET, RNF43, RUNX1, SDHAF2, SDHA (sequence changes only), SDHB, SDHC, SDHD, SMAD4, SMARCA4, SMARCB1, SMARCE1, STK11, SUFU, TERC, TERT, TMEM127, TP53, TSC1, TSC2, VHL, WRN and WT1.     12/24/2018 Surgery   Right lumpectomy [name]): residual invasive carcinoma, clear margins, and 2/3 lymph nodes positive for carcinoma.    01/14/2019 Cancer Staging   Staging form: Breast, AJCC 8th Edition - Pathologic stage from 01/14/2019: No Stage Recommended (ypT2, pN1a, cM0, G2, ER+, PR-, HER2-) - Signed by [name], MD on 01/14/2019   01/22/2019 Miscellaneous   MammaPrint: High risk luminal type B   03/11/2019 Surgery   Bilateral mastectomies [name]):   Left breast: benign tissue and two negative lymph nodes Right breast: residual IDC 0.5 cm, clear margins, 2 lymph nodes negativeER 95%, PR 0%, HER-2 -1+, Ki-67 10%   04/14/2019 - 08/25/2019 Chemotherapy   Patient is on Treatment Plan : Breast AC q21 Days / PACLitaxel q7d     09/22/2019 - 10/30/2019 Radiation Therapy   Adjuvant radiation therapy   11/2019 -  Anti-estrogen oral therapy   Letrozole 2.5 mg daily     REVIEW OF SYSTEMS:   Constitutional: Denies fevers, chills or abnormal weight loss All other systems were reviewed with the patient  and are negative. Observations/Objective:     Assessment Plan:  No problem-specific Assessment & Plan notes found for this encounter.    I discussed  the assessment and treatment plan with the patient. The patient was provided an opportunity to ask questions and all were answered. The patient agreed with the plan and demonstrated an understanding of the instructions. The patient was advised to call back or seek an in-person evaluation if the symptoms worsen or if the condition fails to improve as anticipated.   I provided *** minutes of non-face-to-face time during this encounter.  This includes time for charting and coordination of care   [place], CMA  I Tiffny Gemmer, Danisha Brassfield am acting as a Education administrator for Dr.Vinay [employer]  ***

## 2022-06-05 ENCOUNTER — Encounter (HOSPITAL_COMMUNITY)
Admission: RE | Admit: 2022-06-05 | Discharge: 2022-06-05 | Disposition: A | Discharge status: Home / Self Care | Payer: Medicare Other | Source: Ambulatory Visit | Attending: Hematology and Oncology | Admitting: Hematology and Oncology

## 2022-06-05 DIAGNOSIS — Z170 Estrogen receptor positive status [ER+]: Secondary | ICD-10-CM | POA: Diagnosis not present

## 2022-06-05 DIAGNOSIS — C50411 Malignant neoplasm of upper-outer quadrant of right female breast: Secondary | ICD-10-CM | POA: Diagnosis not present

## 2022-06-05 MED ORDER — TECHNETIUM TC 99M MEDRONATE IV KIT
20.0000 | PACK | Freq: Once | INTRAVENOUS | Status: AC | PRN
  Administered 2022-06-05: 20 via INTRAVENOUS

## 2022-06-06 ENCOUNTER — Inpatient Hospital Stay: Payer: BLUE CROSS/BLUE SHIELD | Attending: Hematology and Oncology | Admitting: Hematology and Oncology

## 2022-06-06 DIAGNOSIS — Z170 Estrogen receptor positive status [ER+]: Secondary | ICD-10-CM | POA: Diagnosis not present

## 2022-06-06 DIAGNOSIS — C50411 Malignant neoplasm of upper-outer quadrant of right female breast: Secondary | ICD-10-CM | POA: Diagnosis not present

## 2022-06-06 NOTE — Assessment & Plan Note (Signed)
04/23/2018 palpable lump in right breast UOQ, mammogram revealed 2 separate masses.  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  Neoadjuvant antiestrogen therapy 05/12/2018-July 2020 3.  12/24/2018 Right lumpectomy: Residual invasive ductal carcinoma 3.8 cm, margins negative, 2/3 lymph nodes positive, ER 95%, PR 0%, HER-2 negative, Ki-67 10%  03/11/2019 bilateral mastectomies [name]):   Left breast: benign tissue and two negative lymph nodes Right breast: residual IDC 0.5 cm , clear margins, 2 lymph nodes negative, ER 95%, PR 0%, HER-2 -1+, Ki-67 10%   4.  MammaPrint high risk: Adjuvant chemotherapy with dose dense Adriamycin and Cytoxan followed by Taxol completed 08/25/2019 5. adjuvant radiation therapy 09/22/19-10/28/19 5.  Followed by adjuvant antiestrogen therapy with letrozole ----------------------------------------------------------------------------------------------------------------------------------------------- clinical trial SWOG 1714 ---------------------------------------------------------------------------------------------------------------------------------- Letrozole Toxicities: Tolerating it extremely well. Hip discomfort: Encouraged her to do some stretching   Breast Cancer Surveillance: 1. Breast exam 05/25/2022: Benign 2. she had bilateral mastectomies therefore there is no role of mammograms   Low  back pain: Bone scan 06/05/2021:

## 2022-06-27 ENCOUNTER — Ambulatory Visit (INDEPENDENT_AMBULATORY_CARE_PROVIDER_SITE_OTHER): Payer: Medicare Other | Admitting: Family Medicine

## 2022-06-27 ENCOUNTER — Encounter: Payer: Self-pay | Admitting: Family Medicine

## 2022-06-27 VITALS — BP 122/65 | HR 57 | Ht 63.0 in | Wt 141.0 lb

## 2022-06-27 DIAGNOSIS — E7800 Pure hypercholesterolemia, unspecified: Secondary | ICD-10-CM | POA: Diagnosis not present

## 2022-06-27 DIAGNOSIS — R8290 Unspecified abnormal findings in urine: Secondary | ICD-10-CM

## 2022-06-27 DIAGNOSIS — L738 Other specified follicular disorders: Secondary | ICD-10-CM | POA: Diagnosis not present

## 2022-06-27 DIAGNOSIS — R319 Hematuria, unspecified: Secondary | ICD-10-CM

## 2022-06-27 DIAGNOSIS — I10 Essential (primary) hypertension: Secondary | ICD-10-CM

## 2022-06-27 LAB — POCT URINALYSIS DIP (CLINITEK)
Bilirubin, UA: NEGATIVE
Glucose, UA: NEGATIVE mg/dL
Ketones, POC UA: NEGATIVE mg/dL
Nitrite, UA: POSITIVE — AB
POC PROTEIN,UA: NEGATIVE
Spec Grav, UA: 1.025 (ref 1.010–1.025)
Urobilinogen, UA: 1 E.U./dL
pH, UA: 6 (ref 5.0–8.0)

## 2022-06-27 MED ORDER — TRIAMCINOLONE ACETONIDE 0.1 % EX CREA: 1.0000 | TOPICAL_CREAM | Freq: Two times a day (BID) | CUTANEOUS | 1 refills | Status: DC

## 2022-06-27 NOTE — Assessment & Plan Note (Signed)
Continue current dose of simvastatin.  Lipids are up-to-date.  Plan to recheck again in 6 months.  Lab Results  Component Value Date   CHOL 199 11/29/2021   HDL 98 11/29/2021   LDLCALC 88 11/29/2021   TRIG 45 11/29/2021   CHOLHDL 2.0 11/29/2021

## 2022-06-27 NOTE — Progress Notes (Signed)
Established Patient Office Visit  Subjective   Patient ID: Samantha Cross, female    DOB: 1945-09-25  Age: 77 y.o. MRN: [id number]  Chief Complaint  Patient presents with   Hypertension    HPI   Hypertension- Pt denies chest pain, SOB, dizziness, or heart palpitations.  Taking meds as directed w/o problems.  Denies medication side effects.  On Norvasc  Hyperlipidemia - tolerating stating well with no myalgias or significant side effects.  Lab Results  Component Value Date   CHOL 199 11/29/2021   HDL 98 11/29/2021   LDLCALC 88 11/29/2021   TRIG 45 11/29/2021   CHOLHDL 2.0 11/29/2021    She is taking some veggie and fruit capsules supplements.    Still gets occ episdoes of hot tub folliculitis.  We have prescribed a cream in the past that helps with the itching and she would like to have a refill on that.  He also reports a really strong odor to her urine for several months.  She says it started before she started the supplements.  No dysuria or hematuria.    ROS    Objective:     BP 122/65   Pulse (!) 57   Ht '5\' 3"'$  (1.6 m)   Wt 141 lb (64 kg)   SpO2 100%   BMI 24.98 kg/m    Physical Exam Constitutional:      Appearance: She is well-developed.  HENT:     Head: Normocephalic and atraumatic.  Cardiovascular:     Rate and Rhythm: Normal rate and regular rhythm.     Heart sounds: Normal heart sounds.  Pulmonary:     Effort: Pulmonary effort is normal.     Breath sounds: Normal breath sounds.  Skin:    General: Skin is warm and dry.  Neurological:     Mental Status: She is alert and oriented to person, place, and time.  Psychiatric:        Behavior: Behavior normal.      Results for orders placed or performed in visit on 06/27/22  POCT URINALYSIS DIP (CLINITEK)  Result Value Ref Range   Color, UA yellow yellow   Clarity, UA cloudy (A) clear   Glucose, UA negative negative mg/dL   Bilirubin, UA negative negative   Ketones, POC UA  negative negative mg/dL   Spec Grav, UA 1.025 1.010 - 1.025   Blood, UA trace-intact (A) negative   pH, UA 6.0 5.0 - 8.0   POC PROTEIN,UA negative negative, trace   Urobilinogen, UA 1.0 0.2 or 1.0 E.U./dL   Nitrite, UA Positive (A) Negative   Leukocytes, UA Small (1+) (A) Negative      The 10-year ASCVD risk score (Arnett DK, et al., 2019) is: 17.2%    Assessment & Plan:   Problem List Items Addressed This Visit       Cardiovascular and Mediastinum   Essential hypertension, benign - Primary    Well controlled. Continue current regimen. Follow up in  16mo        Other   Hyperlipidemia    Continue current dose of simvastatin.  Lipids are up-to-date.  Plan to recheck again in 6 months.  Lab Results  Component Value Date   CHOL 199 11/29/2021   HDL 98 11/29/2021   LDLCALC 88 11/29/2021   TRIG 45 11/29/2021   CHOLHDL 2.0 11/29/2021        Other Visit Diagnoses     Abnormal urine odor  Relevant Orders   POCT URINALYSIS DIP (CLINITEK) (Completed)   Hot tub folliculitis       Relevant Medications   triamcinolone cream (KENALOG) 0.1 %   Hematuria, unspecified type       Relevant Orders   Urine Culture       AbNormal urine odor-will do point-of-care urinalysis.  It was positive for nitrates so we will send for culture for further workup.  Folliculitis-we will send over triamcinolone cream to help with itching and irritation.    Return in about 6 months (around 12/26/2022) for bp.    Beatrice Lecher, MD

## 2022-06-27 NOTE — Assessment & Plan Note (Signed)
Well controlled. Continue current regimen. Follow up in  6 mo  

## 2022-06-29 NOTE — Progress Notes (Signed)
Hi Jessamy, it does look like you have a urinary tract infection going on that was the most likely cause of the urine odor.  They will send [place] the sensitivities on the report hopefully later today and I can send in an antibiotic.  I want a wait to see the full report so I know which antibiotic is best to take care of the infection for you.

## 2022-06-30 LAB — URINE CULTURE
MICRO NUMBER:: 14467137
SPECIMEN QUALITY:: ADEQUATE

## 2022-07-02 MED ORDER — NITROFURANTOIN MONOHYD MACRO 100 MG PO CAPS: 100.0000 mg | ORAL_CAPSULE | Freq: Two times a day (BID) | ORAL | 0 refills | Status: DC

## 2022-07-02 NOTE — Progress Notes (Signed)
Samantha Cross, your urine culture came back positive for bacteria.  Going to send over prescription to clear this up based on your culture results.  Hopefully you will notice some improvement.

## 2022-07-02 NOTE — Addendum Note (Signed)
Addended by: Beatrice Lecher D on: 07/02/2022 01:32 PM   Modules accepted: Orders

## 2022-08-10 ENCOUNTER — Other Ambulatory Visit: Payer: Self-pay | Admitting: Family Medicine

## 2022-08-10 ENCOUNTER — Other Ambulatory Visit: Payer: Self-pay | Admitting: Hematology and Oncology

## 2022-08-10 DIAGNOSIS — E7800 Pure hypercholesterolemia, unspecified: Secondary | ICD-10-CM

## 2022-09-17 ENCOUNTER — Ambulatory Visit (INDEPENDENT_AMBULATORY_CARE_PROVIDER_SITE_OTHER): Payer: Medicare Other | Admitting: Sports Medicine

## 2022-09-17 ENCOUNTER — Other Ambulatory Visit (INDEPENDENT_AMBULATORY_CARE_PROVIDER_SITE_OTHER): Payer: Medicare Other

## 2022-09-17 DIAGNOSIS — M1612 Unilateral primary osteoarthritis, left hip: Secondary | ICD-10-CM

## 2022-09-17 DIAGNOSIS — M7542 Impingement syndrome of left shoulder: Secondary | ICD-10-CM | POA: Diagnosis not present

## 2022-09-17 DIAGNOSIS — M19072 Primary osteoarthritis, left ankle and foot: Secondary | ICD-10-CM

## 2022-09-17 MED ORDER — TRIAMCINOLONE ACETONIDE 40 MG/ML IJ SUSP
40.0000 mg | Freq: Once | INTRAMUSCULAR | Status: AC
  Administered 2022-09-17: 40 mg via INTRAMUSCULAR

## 2022-09-17 NOTE — Assessment & Plan Note (Signed)
Very pleasant 77 year old female, known left hip osteoarthritis last injected 2015 and lasted several years. Increasing pain in the left groin, loss of internal rotation on exam, repeat left hip joint injection, adding x-rays, formal PT. Return to see me in 6 weeks.

## 2022-09-17 NOTE — Assessment & Plan Note (Signed)
Pain tibiotalar joint, adding x-rays, she will work on this and physical therapy and she can do 800 of ibuprofen 3 times daily, will do an injection if not better at the follow-up.

## 2022-09-17 NOTE — Progress Notes (Signed)
    Procedures performed today:    Procedure: Real-time Ultrasound Guided injection of the left hip joint Device: Samsung HS60  Verbal informed consent obtained.  Time-out conducted.  Noted no overlying erythema, induration, or other signs of local infection.  Skin prepped in a sterile fashion.  Local anesthesia: Topical Ethyl chloride.  With sterile technique and under real time ultrasound guidance: OA noted, 1 cc Kenalog 40, 2 cc lidocaine, 2 cc bupivacaine injected easily Completed without difficulty  Advised to call if fevers/chills, erythema, induration, drainage, or persistent bleeding.  Images permanently stored and available for review in PACS.  Impression: Technically successful ultrasound guided injection.  Independent interpretation of notes and tests performed by another provider:   None.  Brief History, Exam, Impression, and Recommendations:    Primary osteoarthritis of left hip Very pleasant 77 year old female, known left hip osteoarthritis last injected 2015 and lasted several years. Increasing pain in the left groin, loss of internal rotation on exam, repeat left hip joint injection, adding x-rays, formal PT. Return to see me in 6 weeks.  Impingement syndrome, shoulder, left Increasing pain left shoulder, positive pinpoint signs, x-rays, formal PT, return to see me in 6 weeks.  Primary osteoarthritis of left ankle Pain tibiotalar joint, adding x-rays, she will work on this and physical therapy and she can do 800 of ibuprofen 3 times daily, will do an injection if not better at the follow-up.    ____________________________________________ Ihor Austin. Benjamin Stain, M.D., ABFM., CAQSM., AME. Primary Care and Sports Medicine [facility]  Adjunct Professor of Family Medicine  [place] [employer]  Restaurant manager, fast food

## 2022-09-17 NOTE — Addendum Note (Signed)
Addended by: Carren Rang A on: 09/17/2022 09:45 AM   Modules accepted: Orders

## 2022-09-17 NOTE — Assessment & Plan Note (Signed)
Increasing pain left shoulder, positive pinpoint signs, x-rays, formal PT, return to see me in 6 weeks.

## 2022-10-02 ENCOUNTER — Encounter: Payer: Self-pay | Admitting: Physical Therapy

## 2022-10-02 ENCOUNTER — Other Ambulatory Visit: Payer: Self-pay

## 2022-10-02 ENCOUNTER — Ambulatory Visit: Payer: Medicare Other | Attending: Sports Medicine | Admitting: Physical Therapy

## 2022-10-02 DIAGNOSIS — G8929 Other chronic pain: Secondary | ICD-10-CM | POA: Insufficient documentation

## 2022-10-02 DIAGNOSIS — M25552 Pain in left hip: Secondary | ICD-10-CM | POA: Diagnosis not present

## 2022-10-02 DIAGNOSIS — M25512 Pain in left shoulder: Secondary | ICD-10-CM | POA: Insufficient documentation

## 2022-10-02 DIAGNOSIS — M6281 Muscle weakness (generalized): Secondary | ICD-10-CM | POA: Insufficient documentation

## 2022-10-02 DIAGNOSIS — M1612 Unilateral primary osteoarthritis, left hip: Secondary | ICD-10-CM | POA: Diagnosis not present

## 2022-10-02 NOTE — Therapy (Signed)
OUTPATIENT PHYSICAL THERAPY LOWER EXTREMITY EVALUATION   Patient Name: Samantha Cross MRN: [id number] DOB:[birth year], 77 y.o., female Today's Date: 10/02/2022  END OF SESSION:  PT End of Session - 10/02/22 1039     Visit Number 1    Number of Visits 12    Date for PT Re-Evaluation 11/13/22    Authorization Type Medicare    Authorization - Visit Number 1    Progress Note Due on Visit 10    PT Start Time [phone]    PT Stop Time 0930    PT Time Calculation (min) 37 min    Activity Tolerance Patient tolerated treatment well    Behavior During Therapy [facility] for tasks assessed/performed             Past Medical History:  Diagnosis Date   Arthritis    Diverticulosis    Family history of brain cancer    Family history of breast cancer    Family history of colon cancer    Family history of melanoma    Family history of ovarian cancer    Hemorrhoids    Hyperlipidemia    Hypertension    Postmenopausal    rt breast ca dx'd 05/2018   Past Surgical History:  Procedure Laterality Date   ABDOMINAL HYSTERECTOMY     without BSO/heavy periods   BLADDER SURGERY  when she was in her 30's   Her bladder was punctured during her hysterectomy and needed to be repaired.    BREAST LUMPECTOMY WITH RADIOACTIVE SEED AND SENTINEL LYMPH NODE BIOPSY Right 12/24/2018   Procedure: RIGHT BREAST LUMPECTOMY WITH RADIOACTIVE SEED AND SENTINEL LYMPH NODE BIOPSY;  Surgeon: Griselda Miner, MD;  Location: [facility];  Service: General;  Laterality: Right;   CLEFT PALATE REPAIR     MIDDLE EAR SURGERY     left   PORT-A-CATH REMOVAL Left 11/06/2019   Procedure: REMOVAL OF PORT;  Surgeon: Griselda Miner, MD;  Location: [facility];  Service: General;  Laterality: Left;   PORTACATH PLACEMENT Left 03/11/2019   Procedure: INSERTION PORT-A-CATH POSSILBLE ULTRASOUND;  Surgeon: Griselda Miner, MD;  Location: [facility];  Service: General;  Laterality: Left;   TOTAL MASTECTOMY  Bilateral 03/11/2019   Procedure: BILATERAL  MASTECTOMIES;  Surgeon: Griselda Miner, MD;  Location: [facility] OR;  Service: General;  Laterality: Bilateral;   Patient Active Problem List   Diagnosis Date Noted   Impingement syndrome, shoulder, left 09/17/2022   Primary osteoarthritis of left ankle 09/17/2022   Acquired absence of breast 05/17/2020   Angioma of skin 05/17/2020   Insomnia 11/10/2019   Port-A-Cath in place 06/23/2019   Cancer of right female breast (HCC) 03/11/2019   Encounter for counseling 05/22/2018   Malignant neoplasm of upper-outer quadrant of right breast in female, estrogen receptor positive (HCC) 05/12/2018   Family history of breast cancer    Family history of ovarian cancer    Family history of brain cancer    Family history of melanoma    Acquired abnormality of left ear ossicles 03/27/2016   Right shoulder pain 03/13/2016   Family history of colon cancer 05/05/2015   Cataract 04/15/2013   Left lumbar radiculitis 03/27/2012   Osteoporosis 12/13/2010   Primary osteoarthritis of left hip 12/12/2010   Essential hypertension, benign 11/17/2010   DIVERTICULOSIS OF COLON 09/01/2009   Hyperlipidemia 07/15/2009    PCP: Linford Arnold  REFERRING PROVIDER: Benjamin Stain  REFERRING DIAG: primary OA of Lt hip, Lt shoulder impingement  THERAPY DIAG:  Pain in left hip  Chronic left shoulder pain  Muscle weakness (generalized)  Rationale for Evaluation and Treatment: Rehabilitation  ONSET DATE: 09/2022  SUBJECTIVE:   SUBJECTIVE STATEMENT: Pt states she has been having hip pain for years. She got a shot in 2017 that worked for years but recently has been having increased Lt hip pain with prolonged walking or time on her feet. Sometimes her Lt LE "gives away".  She states she also has Lt shoulder pain which is worse with extension when reaching into the back seat of her car. She states pain decreases with use of ibuprofen.  PERTINENT HISTORY: 2009 breast cancer PAIN:   Are you having pain?  0/10 currently, 8-9/10 in Lt hip when flared up  PRECAUTIONS: None  WEIGHT BEARING RESTRICTIONS: No  FALLS:  Has patient fallen in last 6 months? No   OCCUPATION: billing/accounting  PLOF: Independent  PATIENT GOALS: decrease pain  NEXT MD VISIT: 5/28  OBJECTIVE:    PATIENT SURVEYS:  FOTO 62    POSTURE: rounded shoulders and forward head  PALPATION: TTP Lt glute med/min, no TTP Lt hip joint Crepitus with Lt shoulder abd and ext  LOWER EXTREMITY ROM: Hip ROM WFL except Lt hip IR limited to netural  Lt SHOULDER ROM: WFL - increased pain at end range abduction and extension   LOWER EXTREMITY MMT:  MMT Right eval Left eval  Hip flexion 4+ 4  Hip extension 4 4  Hip abduction 4 4  Hip adduction    Hip internal rotation    Hip external rotation 4+ 4  Knee flexion    Knee extension    Ankle dorsiflexion    Ankle plantarflexion    Ankle inversion    Ankle eversion     (Blank rows = not tested)    FUNCTIONAL TESTS:  No pain with SLS on Lt    TODAY'S TREATMENT:                                                                                                                              DATE: 10/02/22 See HEP    PATIENT EDUCATION:  Education details: PT POC and goals, HEP Person educated: Patient Education method: Explanation, Demonstration, and Handouts Education comprehension: verbalized understanding and returned demonstration  HOME EXERCISE PROGRAM: Access Code: [email or web address] URL: [email or web address] Date: 10/02/2022 Prepared by: Reggy Eye  Exercises - Supine Bilateral Hip Internal Rotation Stretch  - 1 x daily - 7 x weekly - 1 sets - 10 reps - 10 seconds hold - Seated Hip Internal Rotation AROM  - 1 x daily - 7 x weekly - 1 sets - 10 reps - 10 seconds hold - Side Stepping with Resistance at Ankles  - 1 x daily - 7 x weekly - 3 sets - 10 reps - Isometric Gluteus Medius at Wall  - 1 x daily - 7 x weekly  - 2 sets - 10 reps - 3-5  seconds hold - Squat with Chair Touch  - 1 x daily - 7 x weekly - 3 sets - 10 reps - Standing Bilateral Low Shoulder Row with Anchored Resistance  - 1 x daily - 7 x weekly - 3 sets - 10 reps - Shoulder External Rotation and Scapular Retraction with Resistance  - 1 x daily - 7 x weekly - 3 sets - 10 reps  ASSESSMENT:  CLINICAL IMPRESSION: Patient is a 77 y.o. female who was seen today for physical therapy evaluation and treatment for OA of Lt hip and Lt shoulder impingement. Pt presents with decreased strength and ROM, decreased activity tolerance, increased pain and impaired posture. Pt will benefit from skilled PT to address deficits and improve functional mobility and activity tolerance.   OBJECTIVE IMPAIRMENTS: decreased activity tolerance, decreased ROM, decreased strength, impaired UE functional use, and pain.   ACTIVITY LIMITATIONS: standing and locomotion level  PARTICIPATION LIMITATIONS: driving and community activity  PERSONAL FACTORS: Time since onset of injury/illness/exacerbation are also affecting patient's functional outcome.   REHAB POTENTIAL: Good  CLINICAL DECISION MAKING: Evolving/moderate complexity  EVALUATION COMPLEXITY: Moderate   GOALS: Goals reviewed with patient? Yes  SHORT TERM GOALS: Target date: 10/16/2022   Pt will be independent in initial HEP Baseline: Goal status: INITIAL   LONG TERM GOALS: Target date: 11/13/2022    Pt will be independent in advanced HEP Baseline:  Goal status: INITIAL  2.  Pt will improve FOTO to >= 70 to demo improved activity tolerance Baseline:  Goal status: INITIAL  3.  Pt will improve Lt hip strength to 4+/5 to improve standing and walking tolerance Baseline:  Goal status: INITIAL  4.  Pt will reach with LT UE into back seat of car with pain <= 2/10 Baseline:  Goal status: INITIAL   PLAN:  PT FREQUENCY: 1-2x/week  PT DURATION: 6 weeks  PLANNED INTERVENTIONS: Therapeutic  exercises, Therapeutic activity, Neuromuscular re-education, Balance training, Gait training, Patient/Family education, Self Care, Joint mobilization, Aquatic Therapy, Dry Needling, Electrical stimulation, Cryotherapy, Moist heat, Taping, Vasopneumatic device, Ultrasound, Manual therapy, and Re-evaluation  PLAN FOR NEXT SESSION: assess response to HEP, postural strength, hip strength   Saisha Hogue, PT 10/02/2022, 10:40 AM

## 2022-10-16 ENCOUNTER — Ambulatory Visit: Payer: Medicare Other | Attending: Sports Medicine | Admitting: Physical Therapy

## 2022-10-16 ENCOUNTER — Encounter: Payer: Self-pay | Admitting: Physical Therapy

## 2022-10-16 DIAGNOSIS — M25552 Pain in left hip: Secondary | ICD-10-CM | POA: Diagnosis not present

## 2022-10-16 DIAGNOSIS — G8929 Other chronic pain: Secondary | ICD-10-CM | POA: Insufficient documentation

## 2022-10-16 DIAGNOSIS — M6281 Muscle weakness (generalized): Secondary | ICD-10-CM | POA: Diagnosis present

## 2022-10-16 DIAGNOSIS — M25512 Pain in left shoulder: Secondary | ICD-10-CM | POA: Diagnosis present

## 2022-10-16 NOTE — Therapy (Signed)
OUTPATIENT PHYSICAL THERAPY    Patient Name: Samantha Cross MRN: [id number] DOB:1946/04/18, 77 y.o., female Today's Date: 10/16/2022  END OF SESSION:  PT End of Session - 10/16/22 1010     Visit Number 2    Number of Visits 12    Date for PT Re-Evaluation 11/13/22    Authorization - Visit Number 2    Progress Note Due on Visit 10    PT Start Time 0930    PT Stop Time 1011    PT Time Calculation (min) 41 min    Activity Tolerance Patient tolerated treatment well    Behavior During Therapy WFL for tasks assessed/performed              Past Medical History:  Diagnosis Date   Arthritis    Diverticulosis    Family history of brain cancer    Family history of breast cancer    Family history of colon cancer    Family history of melanoma    Family history of ovarian cancer    Hemorrhoids    Hyperlipidemia    Hypertension    Postmenopausal    rt breast ca dx'd 05/2018   Past Surgical History:  Procedure Laterality Date   ABDOMINAL HYSTERECTOMY     without BSO/heavy periods   BLADDER SURGERY  when she was in her 30's   Her bladder was punctured during her hysterectomy and needed to be repaired.    BREAST LUMPECTOMY WITH RADIOACTIVE SEED AND SENTINEL LYMPH NODE BIOPSY Right 12/24/2018   Procedure: RIGHT BREAST LUMPECTOMY WITH RADIOACTIVE SEED AND SENTINEL LYMPH NODE BIOPSY;  Surgeon: Griselda Miner, MD;  Location: [facility];  Service: General;  Laterality: Right;   CLEFT PALATE REPAIR     MIDDLE EAR SURGERY     left   PORT-A-CATH REMOVAL Left 11/06/2019   Procedure: REMOVAL OF PORT;  Surgeon: Griselda Miner, MD;  Location: [facility];  Service: General;  Laterality: Left;   PORTACATH PLACEMENT Left 03/11/2019   Procedure: INSERTION PORT-A-CATH POSSILBLE ULTRASOUND;  Surgeon: Griselda Miner, MD;  Location: [facility] OR;  Service: General;  Laterality: Left;   TOTAL MASTECTOMY Bilateral 03/11/2019   Procedure: BILATERAL  MASTECTOMIES;   Surgeon: Griselda Miner, MD;  Location: [facility] OR;  Service: General;  Laterality: Bilateral;   Patient Active Problem List   Diagnosis Date Noted   Impingement syndrome, shoulder, left 09/17/2022   Primary osteoarthritis of left ankle 09/17/2022   Acquired absence of breast 05/17/2020   Angioma of skin 05/17/2020   Insomnia 11/10/2019   Port-A-Cath in place 06/23/2019   Cancer of right female breast (HCC) 03/11/2019   Encounter for counseling 05/22/2018   Malignant neoplasm of upper-outer quadrant of right breast in female, estrogen receptor positive (HCC) 05/12/2018   Family history of breast cancer    Family history of ovarian cancer    Family history of brain cancer    Family history of melanoma    Acquired abnormality of left ear ossicles 03/27/2016   Right shoulder pain 03/13/2016   Family history of colon cancer 05/05/2015   Cataract 04/15/2013   Left lumbar radiculitis 03/27/2012   Osteoporosis 12/13/2010   Primary osteoarthritis of left hip 12/12/2010   Essential hypertension, benign 11/17/2010   DIVERTICULOSIS OF COLON 09/01/2009   Hyperlipidemia 07/15/2009    PCP: Linford Arnold  REFERRING PROVIDER: Benjamin Stain  REFERRING DIAG: primary OA of Lt hip, Lt shoulder impingement  THERAPY DIAG:  Pain in left hip  Chronic left shoulder pain  Muscle weakness (generalized)  Rationale for Evaluation and Treatment: Rehabilitation  ONSET DATE: 09/2022  SUBJECTIVE:   SUBJECTIVE STATEMENT: Pt states she has trouble going down the stairs and has to use a step to pattern. She is scared her leg will "give away" during stair negotiation  PERTINENT HISTORY: 2009 breast cancer Pt states she has been having hip pain for years. She got a shot in 2017 that worked for years but recently has been having increased Lt hip pain with prolonged walking or time on her feet. Sometimes her Lt LE "gives away".  She states she also has Lt shoulder pain which is worse with extension when reaching  into the back seat of her car. She states pain decreases with use of ibuprofen. PAIN:  Are you having pain?  0/10 currently, 8-9/10 in Lt hip when flared up  PRECAUTIONS: None  WEIGHT BEARING RESTRICTIONS: No  FALLS:  Has patient fallen in last 6 months? No   OCCUPATION: billing/accounting  PLOF: Independent  PATIENT GOALS: decrease pain  NEXT MD VISIT: 5/28  OBJECTIVE:   PALPATION: TTP Lt glute med/min, no TTP Lt hip joint Crepitus with Lt shoulder abd and ext  LOWER EXTREMITY ROM: Hip ROM WFL except Lt hip IR limited to netural  Lt SHOULDER ROM: WFL - increased pain at end range abduction and extension   LOWER EXTREMITY MMT:  MMT Right eval Left eval  Hip flexion 4+ 4  Hip extension 4 4  Hip abduction 4 4  Hip adduction    Hip internal rotation    Hip external rotation 4+ 4  Knee flexion    Knee extension    Ankle dorsiflexion    Ankle plantarflexion    Ankle inversion    Ankle eversion     (Blank rows = not tested)    FUNCTIONAL TESTS:  No pain with SLS on Lt    TODAY'S TREATMENT:                                                                                                                              [facility] Adult PT Treatment:                                                DATE: 10/16/22 Therapeutic Exercise: Nustep L6 x 5 min Stair negotiation without handrails 4'' step Eccentric step down 4'' step 2 x 10 Sidestep red TB at ankles 15' x 4 Rows green TB x 20 Shoulder ext red TB x 20 Bilat shoulder ER green TB x 20 Squat tap x 10 Supine hip IR stretch 10 x 10 sec Seated hip IR stretch Bridge with march x 10 Shoulder flexion with yellow TB for serratus activation x 15 Shoulder diagonals Lt UE yellow TB  DATE: 10/02/22 See HEP  PATIENT EDUCATION:  Education details: PT POC and goals, HEP Person educated: Patient Education method: Explanation, Demonstration, and Handouts Education comprehension: verbalized understanding and returned  demonstration  HOME EXERCISE PROGRAM: Access Code: [email or web address] URL: [email or web address] Date: 10/02/2022 Prepared by: Reggy Eye  Exercises - Supine Bilateral Hip Internal Rotation Stretch  - 1 x daily - 7 x weekly - 1 sets - 10 reps - 10 seconds hold - Seated Hip Internal Rotation AROM  - 1 x daily - 7 x weekly - 1 sets - 10 reps - 10 seconds hold - Side Stepping with Resistance at Ankles  - 1 x daily - 7 x weekly - 3 sets - 10 reps - Isometric Gluteus Medius at Wall  - 1 x daily - 7 x weekly - 2 sets - 10 reps - 3-5 seconds hold - Squat with Chair Touch  - 1 x daily - 7 x weekly - 3 sets - 10 reps - Standing Bilateral Low Shoulder Row with Anchored Resistance  - 1 x daily - 7 x weekly - 3 sets - 10 reps - Shoulder External Rotation and Scapular Retraction with Resistance  - 1 x daily - 7 x weekly - 3 sets - 10 reps  ASSESSMENT:  CLINICAL IMPRESSION: Pt continues with increased hip pain especially with stair negotiation. Good tolerance to progression of shoulder and hip strengthening exercises. PT educated pt on rationale for treatment and continued prognosis  OBJECTIVE IMPAIRMENTS: decreased activity tolerance, decreased ROM, decreased strength, impaired UE functional use, and pain.     GOALS: Goals reviewed with patient? Yes  SHORT TERM GOALS: Target date: 10/16/2022   Pt will be independent in initial HEP Baseline: Goal status: IN PROGRESS   LONG TERM GOALS: Target date: 11/13/2022    Pt will be independent in advanced HEP Baseline:  Goal status: INITIAL  2.  Pt will improve FOTO to >= 70 to demo improved activity tolerance Baseline:  Goal status: INITIAL  3.  Pt will improve Lt hip strength to 4+/5 to improve standing and walking tolerance Baseline:  Goal status: INITIAL  4.  Pt will reach with LT UE into back seat of car with pain <= 2/10 Baseline:  Goal status: INITIAL   PLAN:  PT FREQUENCY: 1-2x/week  PT DURATION: 6  weeks  PLANNED INTERVENTIONS: Therapeutic exercises, Therapeutic activity, Neuromuscular re-education, Balance training, Gait training, Patient/Family education, Self Care, Joint mobilization, Aquatic Therapy, Dry Needling, Electrical stimulation, Cryotherapy, Moist heat, Taping, Vasopneumatic device, Ultrasound, Manual therapy, and Re-evaluation  PLAN FOR NEXT SESSION: postural strength, hip strength   Marckus Hanover, PT 10/16/2022, 10:11 AM

## 2022-10-24 ENCOUNTER — Ambulatory Visit: Payer: Medicare Other | Admitting: Physical Therapy

## 2022-10-24 ENCOUNTER — Encounter: Payer: Self-pay | Admitting: Physical Therapy

## 2022-10-24 DIAGNOSIS — G8929 Other chronic pain: Secondary | ICD-10-CM

## 2022-10-24 DIAGNOSIS — M25552 Pain in left hip: Secondary | ICD-10-CM

## 2022-10-24 DIAGNOSIS — M6281 Muscle weakness (generalized): Secondary | ICD-10-CM

## 2022-10-24 DIAGNOSIS — M25512 Pain in left shoulder: Secondary | ICD-10-CM | POA: Diagnosis not present

## 2022-10-24 NOTE — Therapy (Addendum)
OUTPATIENT PHYSICAL THERAPY TREATMENT AND DISCHARGE   Patient Name: Samantha Cross MRN: [id number] DOB:11/09/1945, 77 y.o., female Today's Date: 10/24/2022  END OF SESSION:  PT End of Session - 10/24/22 1147     Visit Number 3    Number of Visits 12    Date for PT Re-Evaluation 11/13/22    Authorization Type Medicare    Authorization - Visit Number 3    Progress Note Due on Visit 10    PT Start Time 1118   pt arrived late   PT Stop Time 1145    PT Time Calculation (min) 27 min    Activity Tolerance Patient tolerated treatment well    Behavior During Therapy WFL for tasks assessed/performed               Past Medical History:  Diagnosis Date   Arthritis    Diverticulosis    Family history of brain cancer    Family history of breast cancer    Family history of colon cancer    Family history of melanoma    Family history of ovarian cancer    Hemorrhoids    Hyperlipidemia    Hypertension    Postmenopausal    rt breast ca dx'd 05/2018   Past Surgical History:  Procedure Laterality Date   ABDOMINAL HYSTERECTOMY     without BSO/heavy periods   BLADDER SURGERY  when she was in her 30's   Her bladder was punctured during her hysterectomy and needed to be repaired.    BREAST LUMPECTOMY WITH RADIOACTIVE SEED AND SENTINEL LYMPH NODE BIOPSY Right 12/24/2018   Procedure: RIGHT BREAST LUMPECTOMY WITH RADIOACTIVE SEED AND SENTINEL LYMPH NODE BIOPSY;  Surgeon: Griselda Miner, MD;  Location: [facility];  Service: General;  Laterality: Right;   CLEFT PALATE REPAIR     MIDDLE EAR SURGERY     left   PORT-A-CATH REMOVAL Left 11/06/2019   Procedure: REMOVAL OF PORT;  Surgeon: Griselda Miner, MD;  Location: [facility];  Service: General;  Laterality: Left;   PORTACATH PLACEMENT Left 03/11/2019   Procedure: INSERTION PORT-A-CATH POSSILBLE ULTRASOUND;  Surgeon: Griselda Miner, MD;  Location: [facility] OR;  Service: General;  Laterality: Left;   TOTAL  MASTECTOMY Bilateral 03/11/2019   Procedure: BILATERAL  MASTECTOMIES;  Surgeon: Griselda Miner, MD;  Location: [facility] OR;  Service: General;  Laterality: Bilateral;   Patient Active Problem List   Diagnosis Date Noted   Impingement syndrome, shoulder, left 09/17/2022   Primary osteoarthritis of left ankle 09/17/2022   Acquired absence of breast 05/17/2020   Angioma of skin 05/17/2020   Insomnia 11/10/2019   Port-A-Cath in place 06/23/2019   Cancer of right female breast (HCC) 03/11/2019   Encounter for counseling 05/22/2018   Malignant neoplasm of upper-outer quadrant of right breast in female, estrogen receptor positive (HCC) 05/12/2018   Family history of breast cancer    Family history of ovarian cancer    Family history of brain cancer    Family history of melanoma    Acquired abnormality of left ear ossicles 03/27/2016   Right shoulder pain 03/13/2016   Family history of colon cancer 05/05/2015   Cataract 04/15/2013   Left lumbar radiculitis 03/27/2012   Osteoporosis 12/13/2010   Primary osteoarthritis of left hip 12/12/2010   Essential hypertension, benign 11/17/2010   DIVERTICULOSIS OF COLON 09/01/2009   Hyperlipidemia 07/15/2009    PCP: Linford Arnold  REFERRING PROVIDER: Benjamin Stain  REFERRING DIAG: primary OA of  Lt hip, Lt shoulder impingement  THERAPY DIAG:  Pain in left hip  Chronic left shoulder pain  Muscle weakness (generalized)  Rationale for Evaluation and Treatment: Rehabilitation  ONSET DATE: 09/2022  SUBJECTIVE:   SUBJECTIVE STATEMENT: Pt states she was able to dig holes and plant 3 trees yesterday  PERTINENT HISTORY: 2009 breast cancer Pt states she has been having hip pain for years. She got a shot in 2017 that worked for years but recently has been having increased Lt hip pain with prolonged walking or time on her feet. Sometimes her Lt LE "gives away".  She states she also has Lt shoulder pain which is worse with extension when reaching into the back  seat of her car. She states pain decreases with use of ibuprofen. PAIN:  Are you having pain?  2/10 currently, 8-9/10 in Lt hip when flared up  PRECAUTIONS: None  WEIGHT BEARING RESTRICTIONS: No  FALLS:  Has patient fallen in last 6 months? No   OCCUPATION: billing/accounting  PLOF: Independent  PATIENT GOALS: decrease pain  NEXT MD VISIT: 5/28  OBJECTIVE:   PALPATION: TTP Lt glute med/min, no TTP Lt hip joint Crepitus with Lt shoulder abd and ext  LOWER EXTREMITY ROM: Hip ROM WFL except Lt hip IR limited to netural  Lt SHOULDER ROM: WFL - increased pain at end range abduction and extension   LOWER EXTREMITY MMT:  MMT Right eval Left eval  Hip flexion 4+ 4  Hip extension 4 4  Hip abduction 4 4  Hip adduction    Hip internal rotation    Hip external rotation 4+ 4  Knee flexion    Knee extension    Ankle dorsiflexion    Ankle plantarflexion    Ankle inversion    Ankle eversion     (Blank rows = not tested)    FUNCTIONAL TESTS:  No pain with SLS on Lt    TODAY'S TREATMENT:                                                                                                                              [facility] Adult PT Treatment:                                                DATE: 10/24/22 Therapeutic Exercise: Eccentric step down 4'' step x 10 Step up 4'' step x 10 Side step red TB 15' x 4 Monster walk red TB 15' x 2 Quadruped donkey kick x 5 bilat Quadruped fire hydrant x 5 bilat Seated hip IR x 12 Reverse clam ball between knees 2 x 10 Clam ball between ankles 2 x 10 Single leg bridge x 10 bilat Squat touch 10# KB x 10   [facility] Adult PT Treatment:  DATE: 10/16/22 Therapeutic Exercise: Nustep L6 x 5 min Stair negotiation without handrails 4'' step Eccentric step down 4'' step 2 x 10 Sidestep red TB at ankles 15' x 4 Rows green TB x 20 Shoulder ext red TB x 20 Bilat shoulder ER green TB x 20 Squat tap x  10 Supine hip IR stretch 10 x 10 sec Seated hip IR stretch Bridge with march x 10 Shoulder flexion with yellow TB for serratus activation x 15 Shoulder diagonals Lt UE yellow TB    PATIENT EDUCATION:  Education details: PT POC and goals, HEP Person educated: Patient Education method: Programmer, multimedia, Demonstration, and Handouts Education comprehension: verbalized understanding and returned demonstration  HOME EXERCISE PROGRAM: Access Code: [email or web address] URL: [email or web address] Date: 10/02/2022 Prepared by: Reggy Eye  Exercises - Supine Bilateral Hip Internal Rotation Stretch  - 1 x daily - 7 x weekly - 1 sets - 10 reps - 10 seconds hold - Seated Hip Internal Rotation AROM  - 1 x daily - 7 x weekly - 1 sets - 10 reps - 10 seconds hold - Side Stepping with Resistance at Ankles  - 1 x daily - 7 x weekly - 3 sets - 10 reps - Isometric Gluteus Medius at Wall  - 1 x daily - 7 x weekly - 2 sets - 10 reps - 3-5 seconds hold - Squat with Chair Touch  - 1 x daily - 7 x weekly - 3 sets - 10 reps - Standing Bilateral Low Shoulder Row with Anchored Resistance  - 1 x daily - 7 x weekly - 3 sets - 10 reps - Shoulder External Rotation and Scapular Retraction with Resistance  - 1 x daily - 7 x weekly - 3 sets - 10 reps  ASSESSMENT:  CLINICAL IMPRESSION: Session focused on hip strength today as pt states shoulder has improved and she has met shoulder goals. Continued to work glute strengthening with pt with good tolerance to all activities except eccentric step downs. PT encouraged pt to continue to work on hip and glute strength at home. Pt returns to MD next week for next steps  OBJECTIVE IMPAIRMENTS: decreased activity tolerance, decreased ROM, decreased strength, impaired UE functional use, and pain.     GOALS: Goals reviewed with patient? Yes  SHORT TERM GOALS: Target date: 10/16/2022   Pt will be independent in initial HEP Baseline: Goal status: IN PROGRESS   LONG  TERM GOALS: Target date: 11/13/2022    Pt will be independent in advanced HEP Baseline:  Goal status: INITIAL  2.  Pt will improve FOTO to >= 70 to demo improved activity tolerance Baseline: 65 (10/24/22) Goal status: IN PROGRESS  3.  Pt will improve Lt hip strength to 4+/5 to improve standing and walking tolerance Baseline:  Goal status: INITIAL  4.  Pt will reach with LT UE into back seat of car with pain <= 2/10 Baseline:  Goal status: MET   PLAN:  PT FREQUENCY: 1-2x/week  PT DURATION: 6 weeks  PLANNED INTERVENTIONS: Therapeutic exercises, Therapeutic activity, Neuromuscular re-education, Balance training, Gait training, Patient/Family education, Self Care, Joint mobilization, Aquatic Therapy, Dry Needling, Electrical stimulation, Cryotherapy, Moist heat, Taping, Vasopneumatic device, Ultrasound, Manual therapy, and Re-evaluation  PLAN FOR NEXT SESSION: postural strength, hip strength  PHYSICAL THERAPY DISCHARGE SUMMARY  Visits from Start of Care: 3  Current functional level related to goals / functional outcomes: Decreased pain, improved functional mobility   Remaining deficits: See above   Education / Equipment: HEP   Patient agrees  to discharge. Patient goals were partially met. Patient is being discharged due to being pleased with the current functional level.  Reggy Eye, PT,DPT06/11/2408:43 AM   Reggy Eye, PT 10/24/2022, 11:48 AM

## 2022-10-30 ENCOUNTER — Ambulatory Visit (INDEPENDENT_AMBULATORY_CARE_PROVIDER_SITE_OTHER): Payer: Medicare Other | Admitting: Sports Medicine

## 2022-10-30 DIAGNOSIS — M7542 Impingement syndrome of left shoulder: Secondary | ICD-10-CM

## 2022-10-30 DIAGNOSIS — M1612 Unilateral primary osteoarthritis, left hip: Secondary | ICD-10-CM

## 2022-10-30 MED ORDER — ACETAMINOPHEN ER 650 MG PO TBCR: 650.0000 mg | EXTENDED_RELEASE_TABLET | Freq: Three times a day (TID) | ORAL | 3 refills | Status: DC | PRN

## 2022-10-30 MED ORDER — CELECOXIB 200 MG PO CAPS: ORAL_CAPSULE | ORAL | 2 refills | Status: DC

## 2022-10-30 NOTE — Assessment & Plan Note (Signed)
Improved

## 2022-10-30 NOTE — Progress Notes (Signed)
    Procedures performed today:    None.  Independent interpretation of notes and tests performed by another provider:   None.  Brief History, Exam, Impression, and Recommendations:    Impingement syndrome, shoulder, left Improved.  Primary osteoarthritis of left hip Known osteoarthritis left hip, she has had a few steroid injections, once in 2015 that lasted several years, we did another steroid injection with ultrasound guidance about 6 weeks ago, unfortunately she did not get similar relief. She has been doing formal physical therapy, also not providing sufficient relief. We will switch her from over-the-counter ibuprofen to Celebrex and arthritis is from Tylenol and I would like her to get a surgical consultation with Dr. Luiz Blare. We talked at length about keeping a cane with her as she does feel somewhat unstable, we also talked at length about strength training.    ____________________________________________ Ihor Austin. Benjamin Stain, M.D., ABFM., CAQSM., AME. Primary Care and Sports Medicine [facility]  Adjunct Professor of Family Medicine  [place] [employer]  Restaurant manager, fast food

## 2022-10-30 NOTE — Assessment & Plan Note (Addendum)
Known osteoarthritis left hip, she has had a few steroid injections, once in 2015 that lasted several years, we did another steroid injection with ultrasound guidance about 6 weeks ago, unfortunately she did not get similar relief. She has been doing formal physical therapy, also not providing sufficient relief. We will switch her from over-the-counter ibuprofen to Celebrex and arthritis is from Tylenol and I would like her to get a surgical consultation with Dr. Luiz Blare. We talked at length about keeping a cane with her as she does feel somewhat unstable, we also talked at length about strength training.

## 2022-11-06 DIAGNOSIS — Z170 Estrogen receptor positive status [ER+]: Secondary | ICD-10-CM | POA: Diagnosis not present

## 2022-11-06 DIAGNOSIS — C50411 Malignant neoplasm of upper-outer quadrant of right female breast: Secondary | ICD-10-CM | POA: Diagnosis not present

## 2022-11-20 DIAGNOSIS — M1612 Unilateral primary osteoarthritis, left hip: Secondary | ICD-10-CM | POA: Diagnosis not present

## 2022-12-05 ENCOUNTER — Telehealth: Payer: Self-pay | Admitting: Family Medicine

## 2022-12-05 NOTE — Telephone Encounter (Signed)
Lease call patient and let her know that I did receive a request for preoperative clearance.  If we can call and get her on the schedule that would be fantastic.  It is preoperative clearance for left total hip arthroplasty with [facility], Dr. Jodi Geralds.

## 2022-12-11 ENCOUNTER — Telehealth: Payer: Self-pay | Admitting: Family Medicine

## 2022-12-11 NOTE — Telephone Encounter (Signed)
Sharyl Nimrod with [facility] called. Did we get Surgical Clearance form for total hip replacement? Phone contact for [employer] [phone]

## 2022-12-12 ENCOUNTER — Encounter: Payer: Self-pay | Admitting: Family Medicine

## 2022-12-12 ENCOUNTER — Ambulatory Visit (INDEPENDENT_AMBULATORY_CARE_PROVIDER_SITE_OTHER): Payer: Medicare Other | Admitting: Family Medicine

## 2022-12-12 VITALS — BP 132/69 | HR 63 | Ht 63.0 in | Wt 141.0 lb

## 2022-12-12 DIAGNOSIS — Z01818 Encounter for other preprocedural examination: Secondary | ICD-10-CM | POA: Diagnosis not present

## 2022-12-12 NOTE — Progress Notes (Signed)
Established Patient Office Visit  Subjective   Patient ID: Samantha Cross, female    DOB: July 21, 1945  Age: 77 y.o. MRN: [id number]  Chief Complaint  Patient presents with   Pre-op Exam    HPI  She is here today for preoperative clearance for left hip total arthroplasty with Dr. Jodi Geralds.  She is hoping to get it done this summer as soon as possible.  She has not had any recent chest pain or shortness of breath.  No prior history of heart disease.  She does have a history of hypertension but is currently well-controlled.  No prior surgical complications or difficulty with anesthesia.  She does occasionally have some difficulty swallowing pills.  No recent blood in the urine no prior history of anemia.  She is able to walk half a mile twice a day and has been doing so regularly.  Without difficulty or shortness of breath.    ROS  Current Meds  Medication Sig   acetaminophen (TYLENOL) 650 MG CR tablet Take 1 tablet (650 mg total) by mouth every 8 (eight) hours as needed for pain.   amLODipine (NORVASC) 10 MG tablet Take 1 tablet (10 mg total) by mouth daily.   celecoxib (CELEBREX) 200 MG capsule One to 2 tablets by mouth daily as needed for pain.   Clobetasol Prop Emollient Base (CLOBETASOL PROPIONATE E) 0.05 % emollient cream Apply 1 Application topically 2 (two) times daily as needed (rash).   letrozole (FEMARA) 2.5 MG tablet TAKE 1 TABLET(2.5 MG) BY MOUTH DAILY   Multiple Vitamins-Minerals (HAIR SKIN AND NAILS FORMULA) TABS Take by mouth.   nitrofurantoin, macrocrystal-monohydrate, (MACROBID) 100 MG capsule Take 1 capsule (100 mg total) by mouth 2 (two) times daily.   OVER THE COUNTER MEDICATION Take 2 capsules by mouth 2 (two) times daily as needed (Supplement). [employer]   simvastatin (ZOCOR) 40 MG tablet TAKE 1 TABLET(40 MG) BY MOUTH AT BEDTIME   triamcinolone cream (KENALOG) 0.1 % Apply 1 Application topically 2 (two) times daily.     Allergies  Allergen  Reactions   Lisinopril     Tongue swelling   Alendronate [Alendronate] Other (See Comments)    Chest pain, palpitations.    Hydroxyzine Other (See Comments)    Made her feel weird      Objective:     BP 132/69   Pulse 63   Ht 5\' 3"  (1.6 m)   Wt 141 lb (64 kg)   SpO2 100%   BMI 24.98 kg/m    Physical Exam Constitutional:      Appearance: She is well-developed and normal weight.  HENT:     Head: Normocephalic and atraumatic.     Right Ear: External ear normal.     Left Ear: Tympanic membrane, ear canal and external ear normal.     Ears:     Comments: Abnormal right TM    Nose: Nose normal.     Mouth/Throat:     Pharynx: Oropharynx is clear.  Eyes:     Conjunctiva/sclera: Conjunctivae normal.     Pupils: Pupils are equal, round, and reactive to light.  Neck:     Thyroid: No thyromegaly.  Cardiovascular:     Rate and Rhythm: Normal rate and regular rhythm.     Heart sounds: Normal heart sounds.  Pulmonary:     Effort: Pulmonary effort is normal.     Breath sounds: Normal breath sounds. No wheezing.  Abdominal:     General: Bowel  sounds are normal. There is no distension.     Palpations: Abdomen is soft.     Tenderness: There is no abdominal tenderness.  Musculoskeletal:     Cervical back: Neck supple. No tenderness.  Lymphadenopathy:     Cervical: No cervical adenopathy.  Skin:    General: Skin is warm and dry.  Neurological:     General: No focal deficit present.     Mental Status: She is alert and oriented to person, place, and time.      No results found for any visits on 12/12/22.    The 10-year ASCVD risk score (Arnett DK, et al., 2019) is: 19.9%    Assessment & Plan:   Problem List Items Addressed This Visit   None Visit Diagnoses     Pre-operative clearance    -  Primary      Preoperative clearance-she is cleared for left hip replacement surgery. Will need to stop all NSAIDs 1 week prior to surgery..  Only use Tylenol a week prior to  surgery.  Avoid any aspirin or fish oil products. Continue to keep up routine exercise right up until surgery if at all possible. Per notes from the orthopedic office they will do updated labs. Per records the surgeon will get up-to-date labs before surgery.  EKG obtained today-EKG shows rate of 58 bpm, sinus bradycardia.  No acute ST-T wave findings.  See attached EKG.  Return if symptoms worsen or fail to improve.    Nani Gasser, MD

## 2022-12-12 NOTE — Telephone Encounter (Signed)
Form for Surgical clearance faxed to [phone]

## 2022-12-12 NOTE — Patient Instructions (Signed)
Would highly recommend that you get your tetanus vaccine updated at your local pharmacy before surgery.  It is covered under your Medicare part D for free.

## 2022-12-13 NOTE — Addendum Note (Signed)
Addended by: Chalmers Cater on: 12/13/2022 03:36 PM   Modules accepted: Orders

## 2022-12-26 ENCOUNTER — Ambulatory Visit: Payer: BLUE CROSS/BLUE SHIELD | Admitting: Family Medicine

## 2022-12-31 DIAGNOSIS — M25652 Stiffness of left hip, not elsewhere classified: Secondary | ICD-10-CM | POA: Diagnosis not present

## 2022-12-31 DIAGNOSIS — R262 Difficulty in walking, not elsewhere classified: Secondary | ICD-10-CM | POA: Diagnosis not present

## 2022-12-31 DIAGNOSIS — M1612 Unilateral primary osteoarthritis, left hip: Secondary | ICD-10-CM | POA: Diagnosis not present

## 2023-01-07 DIAGNOSIS — M1612 Unilateral primary osteoarthritis, left hip: Secondary | ICD-10-CM | POA: Diagnosis not present

## 2023-01-07 DIAGNOSIS — M25552 Pain in left hip: Secondary | ICD-10-CM | POA: Diagnosis not present

## 2023-01-16 DIAGNOSIS — M1612 Unilateral primary osteoarthritis, left hip: Secondary | ICD-10-CM | POA: Diagnosis not present

## 2023-01-16 DIAGNOSIS — Z96642 Presence of left artificial hip joint: Secondary | ICD-10-CM | POA: Diagnosis not present

## 2023-01-18 ENCOUNTER — Ambulatory Visit: Payer: Medicare Other | Attending: Orthopedic Surgery | Admitting: Rehabilitative and Restorative Service Providers"

## 2023-01-18 ENCOUNTER — Encounter: Payer: Self-pay | Admitting: Rehabilitative and Restorative Service Providers"

## 2023-01-18 ENCOUNTER — Other Ambulatory Visit: Payer: Self-pay

## 2023-01-18 DIAGNOSIS — M6281 Muscle weakness (generalized): Secondary | ICD-10-CM | POA: Insufficient documentation

## 2023-01-18 DIAGNOSIS — R2689 Other abnormalities of gait and mobility: Secondary | ICD-10-CM | POA: Insufficient documentation

## 2023-01-18 DIAGNOSIS — M25552 Pain in left hip: Secondary | ICD-10-CM | POA: Insufficient documentation

## 2023-01-18 NOTE — Therapy (Signed)
OUTPATIENT PHYSICAL THERAPY LOWER EXTREMITY EVALUATION   Patient Name: Samantha Cross MRN: [id number] DOB:April 11, 1946, 77 y.o., female Today's Date: 01/18/2023  END OF SESSION:  PT End of Session - 01/18/23 0939     Visit Number 1    Number of Visits 16    Date for PT Re-Evaluation 03/19/23    Authorization Type BCBS Medicare    Progress Note Due on Visit 10    PT Start Time 0945    PT Stop Time 1020    PT Time Calculation (min) 35 min    Activity Tolerance Patient tolerated treatment well    Behavior During Therapy WFL for tasks assessed/performed            Past Medical History:  Diagnosis Date   Arthritis    Diverticulosis    Family history of brain cancer    Family history of breast cancer    Family history of colon cancer    Family history of melanoma    Family history of ovarian cancer    Hemorrhoids    Hyperlipidemia    Hypertension    Postmenopausal    rt breast ca dx'd 05/2018   Past Surgical History:  Procedure Laterality Date   ABDOMINAL HYSTERECTOMY     without BSO/heavy periods   BLADDER SURGERY  when she was in her 30's   Her bladder was punctured during her hysterectomy and needed to be repaired.    BREAST LUMPECTOMY WITH RADIOACTIVE SEED AND SENTINEL LYMPH NODE BIOPSY Right 12/24/2018   Procedure: RIGHT BREAST LUMPECTOMY WITH RADIOACTIVE SEED AND SENTINEL LYMPH NODE BIOPSY;  Surgeon: Griselda Miner, MD;  Location: [facility];  Service: General;  Laterality: Right;   CLEFT PALATE REPAIR     MIDDLE EAR SURGERY     left   PORT-A-CATH REMOVAL Left 11/06/2019   Procedure: REMOVAL OF PORT;  Surgeon: Griselda Miner, MD;  Location: [facility];  Service: General;  Laterality: Left;   PORTACATH PLACEMENT Left 03/11/2019   Procedure: INSERTION PORT-A-CATH POSSILBLE ULTRASOUND;  Surgeon: Griselda Miner, MD;  Location: [facility] OR;  Service: General;  Laterality: Left;   TOTAL MASTECTOMY Bilateral 03/11/2019   Procedure:  BILATERAL  MASTECTOMIES;  Surgeon: Griselda Miner, MD;  Location: [facility];  Service: General;  Laterality: Bilateral;   Patient Active Problem List   Diagnosis Date Noted   Impingement syndrome, shoulder, left 09/17/2022   Primary osteoarthritis of left ankle 09/17/2022   Acquired absence of breast 05/17/2020   Angioma of skin 05/17/2020   Insomnia 11/10/2019   Port-A-Cath in place 06/23/2019   Cancer of right female breast (HCC) 03/11/2019   Encounter for counseling 05/22/2018   Malignant neoplasm of upper-outer quadrant of right breast in female, estrogen receptor positive (HCC) 05/12/2018   Family history of breast cancer    Family history of ovarian cancer    Family history of brain cancer    Family history of melanoma    Acquired abnormality of left ear ossicles 03/27/2016   Right shoulder pain 03/13/2016   Family history of colon cancer 05/05/2015   Cataract 04/15/2013   Left lumbar radiculitis 03/27/2012   Osteoporosis 12/13/2010   Primary osteoarthritis of left hip 12/12/2010   Essential hypertension, benign 11/17/2010   DIVERTICULOSIS OF COLON 09/01/2009   Hyperlipidemia 07/15/2009    PCP: Nani Gasser, MD REFERRING PROVIDER: Jodi Geralds, MD  REFERRING DIAG: [phone] (ICD-10-CM) - S/P hip replacement, left   THERAPY DIAG:  Pain in  left hip  Muscle weakness (generalized)  Other abnormalities of gait and mobility  Rationale for Evaluation and Treatment: Rehabilitation  ONSET DATE: SURGICAL DATE  01/16/2023  SUBJECTIVE   SUBJECTIVE STATEMENT: The patient is s/p L hip total replacement (anterior) on 01/16/23. She notes pain is beginning to ease up.  She has some nausea due to meds.  PERTINENT HISTORY: 2009 Breast Cancer, arthritis PAIN:  Are you having pain? Yes: NPRS scale:  none current, feels like "pressure"; can be 5/10 Pain location: L lateral hip Pain description: pressure Aggravating factors: movement Relieving factors: rest  PRECAUTIONS:  Anterior hip  WEIGHT BEARING RESTRICTIONS: No  FALLS:  Has patient fallen in last 6 months? 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  PLOF: Independent  PATIENT GOALS: Walk without pain  NEXT MD VISIT: Monday 8/19  OBJECTIVE:   PATIENT SURVEYS:  FOTO completed  SENSATION: WFL  PALPATION: Tender throughout L thigh--sore to touch per patient  LOWER EXTREMITY ROM: Active ROM Right eval Left eval  Hip flexion WFLs T/o R LE   Hip extension    Hip abduction    Hip adduction    Hip internal rotation    Hip external rotation    Knee flexion    Knee extension    Ankle dorsiflexion    Ankle plantarflexion    Ankle inversion    Ankle eversion     (Blank rows = not tested) LOWER EXTREMITY MMT:  MMT Right eval Left eval  Hip flexion 4/5 2/5  Hip extension    Hip abduction    Hip adduction    Hip internal rotation    Hip external rotation    Knee flexion 5/5 3/5  Knee extension 5/5 3/5  Ankle dorsiflexion    Ankle plantarflexion    Ankle inversion    Ankle eversion     (Blank rows = not tested)  FUNCTIONAL TESTS:  Sit to stand  with UE support Sit<>Supine took >45 seconds with patient using Ues to lift LE  GAIT: Distance walked: 100 ft Assistive device utilized: Environmental consultant - 2 wheeled Level of assistance: Modified independence Comments: 10 ft/ 15.68 seconds=0.64 ft/sec gait speed   [facility] Adult PT Treatment:                                                DATE: 01/18/23 Therapeutic Exercise: See HEP below Also tried standing march, but painful-- will begin with heel slide supine and progress   PATIENT EDUCATION:  Education details: HEP Person educated: Patient Education method: Explanation, Demonstration, and Handouts Education comprehension: verbalized  understanding  HOME EXERCISE PROGRAM: Access Code: [email or web address] URL: [email or web address] Date: 01/18/2023 Prepared by: Margretta Ditty  Exercises - Supine Heel Slide  - 2 x daily - 7 x weekly - 3 sets - 10 reps - Supine Hip Abduction  - 2 x daily - 7 x weekly - 3 sets - 10 reps - Supine Bridge  - 2 x daily - 7 x weekly - 1 sets - 10 reps - Seated Long Arc Quad  - 2 x daily - 7 x weekly - 3 sets - 10 reps - Standing Heel Raise with Support  -  2 x daily - 7 x weekly - 1 sets - 10 reps  ASSESSMENT:  CLINICAL IMPRESSION: Patient is a 77 y.o. female who was seen today for physical therapy evaluation and treatment s/p L THR on 01/16/23. She presents with impairments in muscle strength L LE, weight bearing, gait, balance, and pain. PT to address to promote return to prior functional status.  OBJECTIVE IMPAIRMENTS: Abnormal gait, decreased activity tolerance, decreased balance, difficulty walking, decreased strength, hypomobility, increased fascial restrictions, impaired flexibility, and pain.   ACTIVITY LIMITATIONS: carrying, lifting, bending, standing, squatting, stairs, transfers, bed mobility, bathing, dressing, and locomotion level  PARTICIPATION LIMITATIONS: meal prep, cleaning, community activity, and yard work  PERSONAL FACTORS: 1 comorbidity: h/o breast cancer  are also affecting patient's functional outcome.   REHAB POTENTIAL: Good  CLINICAL DECISION MAKING: Stable/uncomplicated  EVALUATION COMPLEXITY: Low   GOALS: Goals reviewed with patient? Yes  SHORT TERM GOALS: Target date: 02/17/23  Patient will be independent with initial HEP. Baseline: initiated at eval Goal status: INITIAL  2.  The patient will be able to move sit<>supine without Ues needed to lift L LE into bed. Baseline: requires increased time and use of Ues to move the L LE. Goal status: INITIAL  3.  The patient will improve FOTO up to 45% Baseline: 29% functional status score Goal status:  INITIAL   LONG TERM GOALS: Target date: 03/19/23  Patient will be independent with advanced/ongoing HEP to improve outcomes and carryover.  Baseline: initiated at eval Goal status: INITIAL  2.  Patient will report improve FOTO to > or equal to 66%. Baseline: 29% Goal status: INITIAL  3.  Patient will negotiate 4 steps with reciprocal pattern mod indep with one rail or step to pattern without rail. Baseline: did not test due to nausea during eval-- reports holding husband and crutch at this time Goal status: INITIAL  4.  Patient will be able to ambulate 600' with LRAD and normal gait pattern without increased pain to access community.  Baseline: 100 ft slowed pace Goal status: INITIAL  5.  Patient will improve gait speed to > or equal to 2.6 ft/sec to demo full community ambulation Baseline: 0.64 ft/sec Goal status: INITIAL    PLAN:  PT FREQUENCY: 2x/week  PT DURATION: 8 weeks  PLANNED INTERVENTIONS: Therapeutic exercises, Therapeutic activity, Neuromuscular re-education, Balance training, Gait training, Patient/Family education, Self Care, Joint mobilization, Stair training, Aquatic Therapy, Dry Needling, Electrical stimulation, Cryotherapy, Moist heat, Taping, and Manual therapy  PLAN FOR NEXT SESSION: progress HEP, gait with RW for more reciprocal pattern, assess stairs, LE strengthening.   Nusaiba Guallpa, PT 01/18/2023, 1:48 PM

## 2023-01-21 ENCOUNTER — Ambulatory Visit: Payer: Medicare Other

## 2023-01-21 DIAGNOSIS — M6281 Muscle weakness (generalized): Secondary | ICD-10-CM | POA: Diagnosis not present

## 2023-01-21 DIAGNOSIS — M25552 Pain in left hip: Secondary | ICD-10-CM | POA: Diagnosis not present

## 2023-01-21 DIAGNOSIS — R2689 Other abnormalities of gait and mobility: Secondary | ICD-10-CM

## 2023-01-21 NOTE — Therapy (Signed)
OUTPATIENT PHYSICAL THERAPY LOWER EXTREMITY TREATMENT   Patient Name: Samantha Cross MRN: [id number] DOB:1946-02-05, 77 y.o., female Today's Date: 01/21/2023  END OF SESSION:  PT End of Session - 01/21/23 1536     Visit Number 2    Number of Visits 16    Date for PT Re-Evaluation 03/19/23    Authorization Type BCBS Medicare    Authorization - Visit Number 2    Progress Note Due on Visit 10    PT Start Time 1537    PT Stop Time 1617    PT Time Calculation (min) 40 min    Activity Tolerance Patient tolerated treatment well    Behavior During Therapy WFL for tasks assessed/performed            Past Medical History:  Diagnosis Date   Arthritis    Diverticulosis    Family history of brain cancer    Family history of breast cancer    Family history of colon cancer    Family history of melanoma    Family history of ovarian cancer    Hemorrhoids    Hyperlipidemia    Hypertension    Postmenopausal    rt breast ca dx'd 05/2018   Past Surgical History:  Procedure Laterality Date   ABDOMINAL HYSTERECTOMY     without BSO/heavy periods   BLADDER SURGERY  when she was in her 30's   Her bladder was punctured during her hysterectomy and needed to be repaired.    BREAST LUMPECTOMY WITH RADIOACTIVE SEED AND SENTINEL LYMPH NODE BIOPSY Right 12/24/2018   Procedure: RIGHT BREAST LUMPECTOMY WITH RADIOACTIVE SEED AND SENTINEL LYMPH NODE BIOPSY;  Surgeon: Griselda Miner, MD;  Location: [facility];  Service: General;  Laterality: Right;   CLEFT PALATE REPAIR     MIDDLE EAR SURGERY     left   PORT-A-CATH REMOVAL Left 11/06/2019   Procedure: REMOVAL OF PORT;  Surgeon: Griselda Miner, MD;  Location: [facility];  Service: General;  Laterality: Left;   PORTACATH PLACEMENT Left 03/11/2019   Procedure: INSERTION PORT-A-CATH POSSILBLE ULTRASOUND;  Surgeon: Griselda Miner, MD;  Location: [facility] OR;  Service: General;  Laterality: Left;   TOTAL MASTECTOMY  Bilateral 03/11/2019   Procedure: BILATERAL  MASTECTOMIES;  Surgeon: Griselda Miner, MD;  Location: [facility] OR;  Service: General;  Laterality: Bilateral;   Patient Active Problem List   Diagnosis Date Noted   Impingement syndrome, shoulder, left 09/17/2022   Primary osteoarthritis of left ankle 09/17/2022   Acquired absence of breast 05/17/2020   Angioma of skin 05/17/2020   Insomnia 11/10/2019   Port-A-Cath in place 06/23/2019   Cancer of right female breast (HCC) 03/11/2019   Encounter for counseling 05/22/2018   Malignant neoplasm of upper-outer quadrant of right breast in female, estrogen receptor positive (HCC) 05/12/2018   Family history of breast cancer    Family history of ovarian cancer    Family history of brain cancer    Family history of melanoma    Acquired abnormality of left ear ossicles 03/27/2016   Right shoulder pain 03/13/2016   Family history of colon cancer 05/05/2015   Cataract 04/15/2013   Left lumbar radiculitis 03/27/2012   Osteoporosis 12/13/2010   Primary osteoarthritis of left hip 12/12/2010   Essential hypertension, benign 11/17/2010   DIVERTICULOSIS OF COLON 09/01/2009   Hyperlipidemia 07/15/2009    PCP: Nani Gasser, MD REFERRING PROVIDER: Jodi Geralds, MD  REFERRING DIAG: [phone] (ICD-10-CM) - S/P hip replacement,  left   THERAPY DIAG:  Pain in left hip  Muscle weakness (generalized)  Other abnormalities of gait and mobility  Rationale for Evaluation and Treatment: Rehabilitation  ONSET DATE: SURGICAL DATE  01/16/2023  SUBJECTIVE   SUBJECTIVE STATEMENT: Patient reports hip pain usually hits her in the evening and she takes a pain pill then. Patient states she is sleeping fine and uses pillows to keep her legs within protocol.  PERTINENT HISTORY: 2009 Breast Cancer, arthritis PAIN:  Are you having pain? Yes: NPRS scale:  none current, feels like "pressure"; can be 5/10 Pain location: L lateral hip Pain description:  pressure Aggravating factors: movement Relieving factors: rest  PRECAUTIONS: Anterior hip  WEIGHT BEARING RESTRICTIONS: No  FALLS:  Has patient fallen in last 6 months? 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  PLOF: Independent  PATIENT GOALS: Walk without pain  NEXT MD VISIT: Tuesday 01/29/23  OBJECTIVE:   PATIENT SURVEYS:  FOTO completed  SENSATION: WFL  PALPATION: Tender throughout L thigh--sore to touch per patient  LOWER EXTREMITY ROM: Active ROM Right eval Left eval  Hip flexion WFLs T/o R LE   Hip extension    Hip abduction    Hip adduction    Hip internal rotation    Hip external rotation    Knee flexion    Knee extension    Ankle dorsiflexion    Ankle plantarflexion    Ankle inversion    Ankle eversion     (Blank rows = not tested) LOWER EXTREMITY MMT:  MMT Right eval Left eval  Hip flexion 4/5 2/5  Hip extension    Hip abduction    Hip adduction    Hip internal rotation    Hip external rotation    Knee flexion 5/5 3/5  Knee extension 5/5 3/5  Ankle dorsiflexion    Ankle plantarflexion    Ankle inversion    Ankle eversion     (Blank rows = not tested)  FUNCTIONAL TESTS:  Sit to stand  with UE support Sit<>Supine took >45 seconds with patient using Ues to lift LE  GAIT: Distance walked: 100 ft Assistive device utilized: Environmental consultant - 2 wheeled Level of assistance: Modified independence Comments: 10 ft/ 15.68 seconds=0.64 ft/sec gait speed   [facility] Adult PT Treatment:                                                DATE: 01/21/2023 Therapeutic Exercise: Supine heel slides AROM x10 Supine AAROM hip abd 2x10 Bridges x10  Standing heel raises x10 --> alt heel/toe raises x10 Standing marching Self-massage with myofascial ball -->  lumbar/L posterior hip Therapeutic Activity: Ambulation --> focus on body mechanics and tolerance to weight bearing L LE Fwd & lateral weight shifting on L LE    [facility] Adult PT Treatment:                                                DATE: 01/18/23 Therapeutic Exercise: See HEP below Also tried standing march, but painful-- will begin with heel  slide supine and progress   PATIENT EDUCATION:  Education details: HEP Person educated: Patient Education method: Explanation, Demonstration, and Handouts Education comprehension: verbalized understanding  HOME EXERCISE PROGRAM: Access Code: [email or web address] URL: [email or web address] Date: 01/18/2023 Prepared by: Margretta Ditty  Exercises - Supine Heel Slide  - 2 x daily - 7 x weekly - 3 sets - 10 reps - Supine Hip Abduction  - 2 x daily - 7 x weekly - 3 sets - 10 reps - Supine Bridge  - 2 x daily - 7 x weekly - 1 sets - 10 reps - Seated Long Arc Quad  - 2 x daily - 7 x weekly - 3 sets - 10 reps - Standing Heel Raise with Support  - 2 x daily - 7 x weekly - 1 sets - 10 reps  ASSESSMENT:  CLINICAL IMPRESSION: Patient required cueing to maintain legs within post-surgical precaution guidelines (I.e. crossing legs) during transitions; reviewed precautions for implementation at home. Weight shifting performed in forward and lateral directions to progress weight bearing tolerance on L LE during ambulation. Assist from strap needed during supine hip abduction leg slides.  EVAL: Patient is a 77 y.o. female who was seen today for physical therapy evaluation and treatment s/p L THR on 01/16/23. She presents with impairments in muscle strength L LE, weight bearing, gait, balance, and pain. PT to address to promote return to prior functional status.  OBJECTIVE IMPAIRMENTS: Abnormal gait, decreased activity tolerance, decreased balance, difficulty walking, decreased strength, hypomobility, increased fascial restrictions, impaired flexibility,  and pain.   ACTIVITY LIMITATIONS: carrying, lifting, bending, standing, squatting, stairs, transfers, bed mobility, bathing, dressing, and locomotion level  PARTICIPATION LIMITATIONS: meal prep, cleaning, community activity, and yard work  PERSONAL FACTORS: 1 comorbidity: h/o breast cancer  are also affecting patient's functional outcome.   REHAB POTENTIAL: Good  CLINICAL DECISION MAKING: Stable/uncomplicated  EVALUATION COMPLEXITY: Low   GOALS: Goals reviewed with patient? Yes  SHORT TERM GOALS: Target date: 02/17/23  Patient will be independent with initial HEP. Baseline: initiated at eval Goal status: INITIAL  2.  The patient will be able to move sit<>supine without Ues needed to lift L LE into bed. Baseline: requires increased time and use of Ues to move the L LE. Goal status: INITIAL  3.  The patient will improve FOTO up to 45% Baseline: 29% functional status score Goal status: INITIAL   LONG TERM GOALS: Target date: 03/19/23  Patient will be independent with advanced/ongoing HEP to improve outcomes and carryover.  Baseline: initiated at eval Goal status: INITIAL  2.  Patient will report improve FOTO to > or equal to 66%. Baseline: 29% Goal status: INITIAL  3.  Patient will negotiate 4 steps with reciprocal pattern mod indep with one rail or step to pattern without rail. Baseline: did not test due to nausea during eval-- reports holding husband and crutch at this time Goal status: INITIAL  4.  Patient will be able to ambulate 600' with LRAD and normal gait pattern without increased pain to access community.  Baseline: 100 ft slowed pace Goal status: INITIAL  5.  Patient will improve gait speed to > or equal to 2.6 ft/sec to demo full community ambulation Baseline: 0.64 ft/sec Goal status: INITIAL    PLAN:  PT FREQUENCY: 2x/week  PT DURATION: 8 weeks  PLANNED INTERVENTIONS: Therapeutic exercises, Therapeutic activity, Neuromuscular re-education,  Balance training, Gait training, Patient/Family education, Self Care, Joint mobilization, Stair training, Aquatic Therapy, Dry Needling, Electrical stimulation, Cryotherapy, Moist heat, Taping, and  Manual therapy  PLAN FOR NEXT SESSION: progress HEP, gait with RW for more reciprocal pattern, assess stairs, LE strengthening.   Sanjuana Mae, PTA 01/21/2023, 4:20 PM

## 2023-01-25 ENCOUNTER — Ambulatory Visit: Payer: Medicare Other

## 2023-01-28 ENCOUNTER — Ambulatory Visit: Payer: Medicare Other

## 2023-01-28 DIAGNOSIS — M6281 Muscle weakness (generalized): Secondary | ICD-10-CM

## 2023-01-28 DIAGNOSIS — R2689 Other abnormalities of gait and mobility: Secondary | ICD-10-CM

## 2023-01-28 DIAGNOSIS — M25552 Pain in left hip: Secondary | ICD-10-CM | POA: Diagnosis not present

## 2023-01-28 NOTE — Therapy (Addendum)
 OUTPATIENT PHYSICAL THERAPY LOWER EXTREMITY TREATMENT and DISCHARGE SUMMARY    Patient Name: Samantha Cross MRN: [id number] DOB:08-Jul-1945, 77 y.o., female Today's Date: 01/28/2023   PHYSICAL THERAPY DISCHARGE SUMMARY  Visits from Start of Care: 3  Current functional level related to goals / functional outcomes: Patient did not return-- see note below for last known status.   Education / Equipment: HEP   Patient agrees to discharge. Patient goals were not met. Patient is being discharged due to not returning since the last visit.  END OF SESSION:  PT End of Session - 01/28/23 1152     Visit Number 3    Number of Visits 16    Date for PT Re-Evaluation 03/19/23    Authorization Type [employer] Medicare    Authorization - Visit Number 3    Progress Note Due on Visit 10    PT Start Time 1150    PT Stop Time 1228    PT Time Calculation (min) 38 min    Activity Tolerance Patient tolerated treatment well    Behavior During Therapy WFL for tasks assessed/performed            Past Medical History:  Diagnosis Date   Arthritis    Diverticulosis    Family history of brain cancer    Family history of breast cancer    Family history of colon cancer    Family history of melanoma    Family history of ovarian cancer    Hemorrhoids    Hyperlipidemia    Hypertension    Postmenopausal    rt breast ca dx'd 05/2018   Past Surgical History:  Procedure Laterality Date   ABDOMINAL HYSTERECTOMY     without BSO/heavy periods   BLADDER SURGERY  when she was in her 30's   Her bladder was punctured during her hysterectomy and needed to be repaired.    BREAST LUMPECTOMY WITH RADIOACTIVE SEED AND SENTINEL LYMPH NODE BIOPSY Right 12/24/2018   Procedure: RIGHT BREAST LUMPECTOMY WITH RADIOACTIVE SEED AND SENTINEL LYMPH NODE BIOPSY;  Surgeon: Griselda Miner, MD;  Location: [facility];  Service: General;  Laterality: Right;   CLEFT PALATE REPAIR     MIDDLE EAR SURGERY      left   PORT-A-CATH REMOVAL Left 11/06/2019   Procedure: REMOVAL OF PORT;  Surgeon: Griselda Miner, MD;  Location: [facility];  Service: General;  Laterality: Left;   PORTACATH PLACEMENT Left 03/11/2019   Procedure: INSERTION PORT-A-CATH POSSILBLE ULTRASOUND;  Surgeon: Griselda Miner, MD;  Location: [facility] OR;  Service: General;  Laterality: Left;   TOTAL MASTECTOMY Bilateral 03/11/2019   Procedure: BILATERAL  MASTECTOMIES;  Surgeon: Griselda Miner, MD;  Location: [facility] OR;  Service: General;  Laterality: Bilateral;   Patient Active Problem List   Diagnosis Date Noted   Impingement syndrome, shoulder, left 09/17/2022   Primary osteoarthritis of left ankle 09/17/2022   Acquired absence of breast 05/17/2020   Angioma of skin 05/17/2020   Insomnia 11/10/2019   Port-A-Cath in place 06/23/2019   Cancer of right female breast (HCC) 03/11/2019   Encounter for counseling 05/22/2018   Malignant neoplasm of upper-outer quadrant of right breast in female, estrogen receptor positive (HCC) 05/12/2018   Family history of breast cancer    Family history of ovarian cancer    Family history of brain cancer    Family history of melanoma    Acquired abnormality of left ear ossicles 03/27/2016   Right shoulder pain 03/13/2016  Family history of colon cancer 05/05/2015   Cataract 04/15/2013   Left lumbar radiculitis 03/27/2012   Osteoporosis 12/13/2010   Primary osteoarthritis of left hip 12/12/2010   Essential hypertension, benign 11/17/2010   DIVERTICULOSIS OF COLON 09/01/2009   Hyperlipidemia 07/15/2009    PCP: Nani Gasser, MD REFERRING PROVIDER: Jodi Geralds, MD  REFERRING DIAG: [phone] (ICD-10-CM) - S/P hip replacement, left   THERAPY DIAG:  Pain in left hip  Muscle weakness (generalized)  Other abnormalities of gait and mobility  Rationale for Evaluation and Treatment: Rehabilitation  ONSET DATE: SURGICAL DATE  01/16/2023  SUBJECTIVE   SUBJECTIVE STATEMENT: Patient  reports she continues to have most pain at night and takes pain medication as needed. Patient states she has transitioned from [facility] to [facility] and has been walking a few blocks outside with [facility]. Patient states she has 2-week follow-up with surgeon tomorrow.  PERTINENT HISTORY: 2009 Breast Cancer, arthritis PAIN:  Are you having pain? Yes: NPRS scale:  none current, feels like "pressure"; can be 5/10 Pain location: L lateral hip Pain description: pressure Aggravating factors: movement Relieving factors: rest  PRECAUTIONS: Anterior hip  WEIGHT BEARING RESTRICTIONS: No  FALLS:  Has patient fallen in last 6 months? 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  PLOF: Independent  PATIENT GOALS: Walk without pain  NEXT MD VISIT: Tuesday 01/29/23  OBJECTIVE:   PATIENT SURVEYS:  FOTO completed  SENSATION: WFL  PALPATION: Tender throughout L thigh--sore to touch per patient  LOWER EXTREMITY ROM: Active ROM Right eval Left eval  Hip flexion WFLs T/o R LE   Hip extension    Hip abduction    Hip adduction    Hip internal rotation    Hip external rotation    Knee flexion    Knee extension    Ankle dorsiflexion    Ankle plantarflexion    Ankle inversion    Ankle eversion     (Blank rows = not tested) LOWER EXTREMITY MMT:  MMT Right eval Left eval  Hip flexion 4/5 2/5  Hip extension    Hip abduction    Hip adduction    Hip internal rotation    Hip external rotation    Knee flexion 5/5 3/5  Knee extension 5/5 3/5  Ankle dorsiflexion    Ankle plantarflexion    Ankle inversion    Ankle eversion     (Blank rows = not tested)  FUNCTIONAL TESTS:  Sit to stand  with UE support Sit<>Supine took >45 seconds with patient using Ues to lift LE  GAIT: Distance walked: 100  ft Assistive device utilized: Environmental consultant - 2 wheeled Level of assistance: Modified independence Comments: 10 ft/ 15.68 seconds=0.64 ft/sec gait speed   [facility] Adult PT Treatment:                                                DATE: 01/28/2023 Therapeutic Exercise: Supine: Bridges  AROM heel slides AROM hip abd NuStep (knee within 90 deg flex) L2 x Seated LAQ (L) + ball b/w knees for stability Standing heel raises 2x10 Paloff press GTB x10 (B) STS (elevated seat) x10 Therapeutic Activity: SPC adjustment for proper height Gait training  with SPC --> glute activation in [place]  Weight shifting on L LE --> lateral & fwd/bkwd shifting    [facility] Adult PT Treatment:                                                DATE: 01/21/2023 Therapeutic Exercise: Supine heel slides AROM x10 Supine AAROM hip abd 2x10 Bridges x10  Standing heel raises x10 --> alt heel/toe raises x10 Standing marching Self-massage with myofascial ball --> lumbar/L posterior hip Therapeutic Activity: Ambulation --> focus on body mechanics and tolerance to weight bearing L LE Fwd & lateral weight shifting on L LE    [facility] Adult PT Treatment:                                                DATE: 01/18/23 Therapeutic Exercise: See HEP below Also tried standing march, but painful-- will begin with heel slide supine and progress   PATIENT EDUCATION:  Education details: HEP Person educated: Patient Education method: Explanation, Demonstration, and Handouts Education comprehension: verbalized understanding  HOME EXERCISE PROGRAM: Access Code: [email or web address] URL: [email or web address] Date: 01/18/2023 Prepared by: Margretta Ditty  Exercises - Supine Heel Slide  - 2 x daily - 7 x weekly - 3 sets - 10 reps - Supine Hip Abduction  - 2 x daily - 7 x weekly - 3 sets - 10 reps - Supine Bridge  - 2 x daily - 7 x weekly - 1 sets - 10 reps - Seated Long Arc Quad  - 2 x daily - 7 x weekly - 3 sets - 10 reps -  Standing Heel Raise with Support  - 2 x daily - 7 x weekly - 1 sets - 10 reps  ASSESSMENT:  CLINICAL IMPRESSION: Gait training performed with SPC; weight shifting activities improved L glute activation with weight bearing on L LE during walking. Paloff press incorporated to progress core and postural strength.   EVAL: Patient is a 77 y.o. female who was seen today for physical therapy evaluation and treatment s/p L THR on 01/16/23. She presents with impairments in muscle strength L LE, weight bearing, gait, balance, and pain. PT to address to promote return to prior functional status.  OBJECTIVE IMPAIRMENTS: Abnormal gait, decreased activity tolerance, decreased balance, difficulty walking, decreased strength, hypomobility, increased fascial restrictions, impaired flexibility, and pain.   ACTIVITY LIMITATIONS: carrying, lifting, bending, standing, squatting, stairs, transfers, bed mobility, bathing, dressing, and locomotion level  PARTICIPATION LIMITATIONS: meal prep, cleaning, community activity, and yard work  PERSONAL FACTORS: 1 comorbidity: h/o breast cancer  are also affecting patient's functional outcome.   REHAB POTENTIAL: Good  CLINICAL DECISION MAKING: Stable/uncomplicated  EVALUATION COMPLEXITY: Low   GOALS: Goals reviewed with patient? Yes  SHORT TERM GOALS: Target date: 02/17/23  Patient will be independent with initial HEP. Baseline: initiated at eval Goal status: INITIAL  2.  The patient will be able to move sit<>supine without Ues needed to lift L LE into bed. Baseline: requires increased time and use of Ues to move the L LE. Goal status: INITIAL  3.  The patient will improve FOTO up to 45% Baseline: 29% functional status score Goal status: INITIAL   LONG TERM GOALS: Target date: 03/19/23  Patient  will be independent with advanced/ongoing HEP to improve outcomes and carryover.  Baseline: initiated at eval Goal status: INITIAL  2.  Patient will report  improve FOTO to > or equal to 66%. Baseline: 29% Goal status: INITIAL  3.  Patient will negotiate 4 steps with reciprocal pattern mod indep with one rail or step to pattern without rail. Baseline: did not test due to nausea during eval-- reports holding husband and crutch at this time Goal status: INITIAL  4.  Patient will be able to ambulate 600' with LRAD and normal gait pattern without increased pain to access community.  Baseline: 100 ft slowed pace Goal status: INITIAL  5.  Patient will improve gait speed to > or equal to 2.6 ft/sec to demo full community ambulation Baseline: 0.64 ft/sec Goal status: INITIAL    PLAN:  PT FREQUENCY: 2x/week  PT DURATION: 8 weeks  PLANNED INTERVENTIONS: Therapeutic exercises, Therapeutic activity, Neuromuscular re-education, Balance training, Gait training, Patient/Family education, Self Care, Joint mobilization, Stair training, Aquatic Therapy, Dry Needling, Electrical stimulation, Cryotherapy, Moist heat, Taping, and Manual therapy  PLAN FOR NEXT SESSION: Follow-up on surgeon appt. Progress HEP, gait with SPC for more reciprocal pattern, assess stairs, LE strengthening.  WEAVER,CHRISTINA, PT  Sanjuana Mae, PTA 01/28/2023, 12:33 PM

## 2023-01-29 DIAGNOSIS — M1612 Unilateral primary osteoarthritis, left hip: Secondary | ICD-10-CM | POA: Diagnosis not present

## 2023-02-01 ENCOUNTER — Ambulatory Visit: Payer: Medicare Other

## 2023-02-05 ENCOUNTER — Ambulatory Visit: Payer: Medicare Other | Attending: Orthopedic Surgery | Admitting: Physical Therapy

## 2023-02-05 DIAGNOSIS — R2689 Other abnormalities of gait and mobility: Secondary | ICD-10-CM | POA: Insufficient documentation

## 2023-02-05 DIAGNOSIS — M25552 Pain in left hip: Secondary | ICD-10-CM | POA: Insufficient documentation

## 2023-02-05 DIAGNOSIS — M6281 Muscle weakness (generalized): Secondary | ICD-10-CM | POA: Insufficient documentation

## 2023-02-08 ENCOUNTER — Ambulatory Visit: Payer: Medicare Other | Admitting: Physical Therapy

## 2023-02-10 ENCOUNTER — Other Ambulatory Visit: Payer: Self-pay | Admitting: Family Medicine

## 2023-02-10 DIAGNOSIS — E7800 Pure hypercholesterolemia, unspecified: Secondary | ICD-10-CM

## 2023-02-11 ENCOUNTER — Ambulatory Visit: Payer: Medicare Other

## 2023-02-15 ENCOUNTER — Ambulatory Visit: Payer: Medicare Other | Admitting: Physical Therapy

## 2023-02-18 ENCOUNTER — Telehealth: Payer: Self-pay | Admitting: Family Medicine

## 2023-02-18 DIAGNOSIS — I10 Essential (primary) hypertension: Secondary | ICD-10-CM

## 2023-02-18 MED ORDER — AMLODIPINE BESYLATE 10 MG PO TABS: 10.0000 mg | ORAL_TABLET | Freq: Every day | ORAL | 0 refills | Status: DC

## 2023-02-18 NOTE — Telephone Encounter (Signed)
Patient called requesting a refill of ; amLODipine (NORVASC) 10 MG tablet [[id number]]   Patient is going on vacation at the end of the week and will not have enough to last the duration of her trip,  Pharmacy ;   [id number] - [place], [place] - [address]

## 2023-02-18 NOTE — Telephone Encounter (Signed)
Refill sent to pharmacy.   

## 2023-02-28 DIAGNOSIS — Z471 Aftercare following joint replacement surgery: Secondary | ICD-10-CM | POA: Diagnosis not present

## 2023-02-28 DIAGNOSIS — M25562 Pain in left knee: Secondary | ICD-10-CM | POA: Diagnosis not present

## 2023-03-28 DIAGNOSIS — M25552 Pain in left hip: Secondary | ICD-10-CM | POA: Diagnosis not present

## 2023-05-12 ENCOUNTER — Other Ambulatory Visit: Payer: Self-pay | Admitting: Family Medicine

## 2023-05-12 DIAGNOSIS — I10 Essential (primary) hypertension: Secondary | ICD-10-CM

## 2023-05-16 ENCOUNTER — Other Ambulatory Visit: Payer: Self-pay | Admitting: *Deleted

## 2023-05-20 ENCOUNTER — Ambulatory Visit (INDEPENDENT_AMBULATORY_CARE_PROVIDER_SITE_OTHER): Payer: Medicare Other | Admitting: Family Medicine

## 2023-05-20 ENCOUNTER — Encounter: Payer: Self-pay | Admitting: Family Medicine

## 2023-05-20 VITALS — Ht 64.0 in | Wt 140.0 lb

## 2023-05-20 DIAGNOSIS — Z0000 Encounter for general adult medical examination without abnormal findings: Secondary | ICD-10-CM

## 2023-05-20 NOTE — Progress Notes (Signed)
Subjective:   Samantha Cross is a 77 y.o. female who presents for Medicare Annual (Subsequent) preventive examination.  Visit Complete: Virtual I connected with  Samantha Cross on 05/20/23 by a audio enabled telemedicine application and verified that I am speaking with the correct person using two identifiers.  Patient Location: Home  Provider Location: Office/Clinic  I discussed the limitations of evaluation and management by telemedicine. The patient expressed understanding and agreed to proceed.  Vital Signs: Because this visit was a virtual/telehealth visit, some criteria may be missing or patient reported. Any vitals not documented were not able to be obtained and vitals that have been documented are patient reported.  Patient Medicare AWV questionnaire was completed by the patient on n/a; I have confirmed that all information answered by patient is correct and no changes since this date.  Cardiac Risk Factors include: advanced age (>73men, >29 women);dyslipidemia;hypertension     Objective:    Today's Vitals   05/20/23 1009  Weight: 140 lb (63.5 kg)  Height: 5\' 4"  (1.626 m)   Body mass index is 24.03 kg/m.     05/20/2023   10:17 AM 10/02/2022    8:56 AM 05/25/2022   10:17 AM 05/14/2022    9:56 AM 06/02/2021    9:33 AM 11/06/2019    7:29 AM 10/30/2019    2:01 PM  Advanced Directives  Does Patient Have a Medical Advance Directive? Yes No No No No No No  Type of Advance Directive Healthcare Power of Attorney        Does patient want to make changes to medical advance directive? No - Patient declined        Would patient like information on creating a medical advance directive?   No - Patient declined No - Patient declined No - Patient declined No - Patient declined Yes (MAU/Ambulatory/Procedural Areas - Information given)    Current Medications (verified) Outpatient Encounter Medications as of 05/20/2023  Medication Sig   amLODipine (NORVASC) 10  MG tablet TAKE 1 TABLET(10 MG) BY MOUTH DAILY   letrozole (FEMARA) 2.5 MG tablet TAKE 1 TABLET(2.5 MG) BY MOUTH DAILY   Multiple Vitamins-Minerals (HAIR SKIN AND NAILS FORMULA) TABS Take by mouth.   simvastatin (ZOCOR) 40 MG tablet TAKE 1 TABLET(40 MG) BY MOUTH AT BEDTIME   acetaminophen (TYLENOL) 650 MG CR tablet Take 1 tablet (650 mg total) by mouth every 8 (eight) hours as needed for pain. (Patient not taking: Reported on 05/20/2023)   celecoxib (CELEBREX) 200 MG capsule One to 2 tablets by mouth daily as needed for pain. (Patient not taking: Reported on 05/20/2023)   Clobetasol Prop Emollient Base (CLOBETASOL PROPIONATE E) 0.05 % emollient cream Apply 1 Application topically 2 (two) times daily as needed (rash). (Patient not taking: Reported on 05/20/2023)   OVER THE COUNTER MEDICATION Take 2 capsules by mouth 2 (two) times daily as needed (Supplement). [employer] (Patient not taking: Reported on 05/20/2023)   triamcinolone cream (KENALOG) 0.1 % Apply 1 Application topically 2 (two) times daily. (Patient not taking: Reported on 05/20/2023)   No facility-administered encounter medications on file as of 05/20/2023.    Allergies (verified) Lisinopril, Alendronate [alendronate], and Hydroxyzine   History: Past Medical History:  Diagnosis Date   Arthritis    Diverticulosis    Family history of brain cancer    Family history of breast cancer    Family history of colon cancer    Family history of melanoma    Family history of  ovarian cancer    Hemorrhoids    Hyperlipidemia    Hypertension    Postmenopausal    rt breast ca dx'd 05/2018   Past Surgical History:  Procedure Laterality Date   ABDOMINAL HYSTERECTOMY     without BSO/heavy periods   BLADDER SURGERY  when she was in her 30's   Her bladder was punctured during her hysterectomy and needed to be repaired.    BREAST LUMPECTOMY WITH RADIOACTIVE SEED AND SENTINEL LYMPH NODE BIOPSY Right 12/24/2018   Procedure: RIGHT BREAST  LUMPECTOMY WITH RADIOACTIVE SEED AND SENTINEL LYMPH NODE BIOPSY;  Surgeon: Griselda Miner, MD;  Location: [facility];  Service: General;  Laterality: Right;   CLEFT PALATE REPAIR     MIDDLE EAR SURGERY     left   PORT-A-CATH REMOVAL Left 11/06/2019   Procedure: REMOVAL OF PORT;  Surgeon: Griselda Miner, MD;  Location: [facility];  Service: General;  Laterality: Left;   PORTACATH PLACEMENT Left 03/11/2019   Procedure: INSERTION PORT-A-CATH POSSILBLE ULTRASOUND;  Surgeon: Griselda Miner, MD;  Location: [facility] OR;  Service: General;  Laterality: Left;   TOTAL MASTECTOMY Bilateral 03/11/2019   Procedure: BILATERAL  MASTECTOMIES;  Surgeon: Griselda Miner, MD;  Location: [facility] OR;  Service: General;  Laterality: Bilateral;   Family History  Problem Relation Age of Onset   Ovarian cancer Mother        dx under 76; d. 41   Colon cancer Mother 23   Cancer Brother        brain tumor/gliobastoma, multiforme   Melanoma Brother    Kidney cancer Brother 36   Cancer Brother        renal cell carcinoma, colon ca   Heart attack Father 37       died/CVA   Heart attack Brother 40   Hyperlipidemia Other    Breast cancer Sister 81   Cancer Maternal Aunt        NOS   Colon cancer Maternal Uncle        dx in his 70s   Colon cancer Maternal Grandmother        dx over 48   Social History   Socioeconomic History   Marital status: Married    Spouse name: Samantha Cross   Number of children: 2   Years of education: 14   Highest education level: Associate degree: academic program  Occupational History   Occupation: has a farm.    Occupation: Retired  Tobacco Use   Smoking status: Former    Current packs/day: 0.00    Types: Cigarettes    Quit date: 06/04/1998    Years since quitting: 24.9   Smokeless tobacco: Never  Vaping Use   Vaping status: Never Used  Substance and Sexual Activity   Alcohol use: Not Currently    Comment: social   Drug use: No   Sexual activity: Not on file     Comment: married, 2 grown children, 2 stepkids, no regular exercise, 3 beagles, working PT at daycare.  Other Topics Concern   Not on file  Social History Narrative   Lives with her husband. 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 She enjoys kayaking, hunting and being outdoors.   Social Drivers of Corporate investment banker Strain: Low Risk  (05/20/2023)   Overall Financial Resource Strain (CARDIA)    Difficulty of Paying Living Expenses: Not hard at all  Food Insecurity: No Food Insecurity (05/20/2023)   Hunger  Vital Sign    Worried About Programme researcher, broadcasting/film/video in the Last Year: Never true    Ran Out of Food in the Last Year: Never true  Transportation Needs: No Transportation Needs (05/20/2023)   PRAPARE - Administrator, Civil Service (Medical): No    Lack of Transportation (Non-Medical): No  Physical Activity: Inactive (05/14/2022)   Exercise Vital Sign    Days of Exercise per Week: 0 days    Minutes of Exercise per Session: 0 min  Stress: No Stress Concern Present (05/20/2023)   [employer] of Occupational Health - Occupational Stress Questionnaire    Feeling of Stress : Only a little  Social Connections: Moderately Isolated (05/20/2023)   Social Connection and Isolation Panel [NHANES]    Frequency of Communication with Friends and Family: More than three times a week    Frequency of Social Gatherings with Friends and Family: More than three times a week    Attends Religious Services: Never    Database administrator or Organizations: No    Attends Engineer, structural: Never    Marital Status: Married    Tobacco Counseling Counseling given: Not Answered   Clinical Intake:  Pre-visit preparation completed: No  Pain : No/denies pain     BMI - recorded: 24 Nutritional Status: BMI of 19-24  Normal Nutritional Risks: None Diabetes: No  How often do you need to have someone help you when you read instructions, pamphlets, or  other written materials from your doctor or pharmacy?: 1 - Never What is the last grade level you completed in school?: 14  Interpreter Needed?: No      Activities of Daily Living    05/20/2023   10:11 AM  In your present state of health, do you have any difficulty performing the following activities:  Hearing? 0  Vision? 0  Difficulty concentrating or making decisions? 0  Walking or climbing stairs? 0  Dressing or bathing? 0  Doing errands, shopping? 0  Preparing Food and eating ? N  Using the Toilet? N  In the past six months, have you accidently leaked urine? N  Do you have problems with loss of bowel control? Y  Comment occassionly  Managing your Medications? N  Managing your Finances? N  Housekeeping or managing your Housekeeping? N    Patient Care Team: Agapito Games, MD as PCP - General (Family Medicine) Donnelly Angelica, RN as Oncology Nurse Navigator Pershing Proud, RN as Oncology Nurse Navigator Benjamin Stain, Ihor Austin, MD as Consulting Physician (Family Medicine) Serena Croissant, Oncology    Indicate any recent Medical Services you may have received from other than [facility] providers in the past year (date may be approximate).     Assessment:   This is a routine wellness examination for Chazity.  Hearing/Vision screen Hearing Screening - Comments:: Unable to test, grossly intact Vision Screening - Comments:: Unable to test, wears glasses. No issues    Goals Addressed             This Visit's Progress    Set My Weight Loss Goal          - set weight loss goal : lose 10 pounds         Depression Screen    05/20/2023   10:16 AM 06/27/2022    8:33 AM 05/14/2022    9:56 AM 11/29/2021    8:43 AM 08/17/2020    8:48 AM 11/10/2019    9:22 AM  04/09/2018    8:57 AM  PHQ 2/9 Scores  PHQ - 2 Score 0 0 0 0 1 6 0  PHQ- 9 Score      9     Fall Risk    05/20/2023   10:18 AM 06/27/2022    8:33 AM 05/14/2022    9:56 AM 11/29/2021    8:43 AM 05/31/2021     8:44 AM  Fall Risk   Falls in the past year? 1 0 0 0 0  Number falls in past yr: 0 0 0 0 0  Injury with Fall? 0 0 0 0 0  Risk for fall due to : Other (Comment) No Fall Risks No Fall Risks No Fall Risks No Fall Risks  Risk for fall due to: Comment tripped on curb      Follow up  Falls evaluation completed Falls evaluation completed Falls prevention discussed;Falls evaluation completed Falls prevention discussed;Falls evaluation completed    MEDICARE RISK AT HOME: Medicare Risk at Home Any stairs in or around the home?: No If so, are there any without handrails?: No Home free of loose throw rugs in walkways, pet beds, electrical cords, etc?: Yes Adequate lighting in your home to reduce risk of falls?: Yes Life alert?: No Use of a cane, walker or w/c?: No Grab bars in the bathroom?: No Shower chair or bench in shower?: No Elevated toilet seat or a handicapped toilet?: No  TIMED UP AND GO:  Was the test performed?  No    Cognitive Function:        05/20/2023   10:20 AM 05/14/2022   10:04 AM  6CIT Screen  What Year? 0 points 0 points  What month? 0 points 0 points  What time? 0 points 0 points  Count back from 20 0 points 0 points  Months in reverse 0 points 2 points  Repeat phrase 0 points 2 points  Total Score 0 points 4 points    Immunizations Immunization History  Administered Date(s) Administered   Fluad Quad(high Dose 65+) 05/26/2019, 05/31/2021   PFIZER(Purple Top)SARS-COV-2 Vaccination 08/17/2019, 09/08/2019   PNEUMOCOCCAL CONJUGATE-20 05/31/2021   Tdap 02/29/2012   Zoster, Live 03/05/2012    TDAP status: Due, Education has been provided regarding the importance of this vaccine. Advised may receive this vaccine at local pharmacy or Health Dept. Aware to provide a copy of the vaccination record if obtained from local pharmacy or Health Dept. Verbalized acceptance and understanding.  Flu Vaccine status: Declined, Education has been provided regarding the  importance of this vaccine but patient still declined. Advised may receive this vaccine at local pharmacy or Health Dept. Aware to provide a copy of the vaccination record if obtained from local pharmacy or Health Dept. Verbalized acceptance and understanding.  Pneumococcal vaccine status: Up to date  Covid-19 vaccine status: Declined, Education has been provided regarding the importance of this vaccine but patient still declined. Advised may receive this vaccine at local pharmacy or Health Dept.or vaccine clinic. Aware to provide a copy of the vaccination record if obtained from local pharmacy or Health Dept. Verbalized acceptance and understanding.  Qualifies for Shingles Vaccine? Yes   Zostavax completed Yes   Shingrix Completed?: No.    Education has been provided regarding the importance of this vaccine. Patient has been advised to call insurance company to determine out of pocket expense if they have not yet received this vaccine. Advised may also receive vaccine at local pharmacy or Health Dept. Verbalized acceptance and understanding.  Screening Tests Health Maintenance  Topic Date Due   DTaP/Tdap/Td (2 - Td or Tdap) 02/28/2022   INFLUENZA VACCINE  01/03/2023   COVID-19 Vaccine (3 - Pfizer risk series) 07/14/2023 (Originally 10/06/2019)   Zoster Vaccines- Shingrix (1 of 2) 06/03/2024 (Originally 04/25/1965)   Medicare Annual Wellness (AWV)  05/19/2024   Colonoscopy  10/05/2025   Pneumonia Vaccine 72+ Years old  Completed   DEXA SCAN  Completed   Hepatitis C Screening  Completed   HPV VACCINES  Aged Out    Health Maintenance  Health Maintenance Due  Topic Date Due   DTaP/Tdap/Td (2 - Td or Tdap) 02/28/2022   INFLUENZA VACCINE  01/03/2023    Colorectal cancer screening: Type of screening: Colonoscopy. Completed 10/05/20. Repeat every 5 years  Mammogram status: No longer required due to mastectomy .  Bone Density status: Completed 05/27/22. Results reflect: Bone density results:  OSTEOPOROSIS. Repeat every 2 years.  Lung Cancer Screening: (Low Dose CT Chest recommended if Age 14-80 years, 20 pack-year currently smoking OR have quit w/in 15years.) does not qualify.   Lung Cancer Screening Referral: n/a  Additional Screening:  Hepatitis C Screening: does qualify; Completed 11/08/15  Vision Screening: Recommended annual ophthalmology exams for early detection of glaucoma and other disorders of the eye. Is the patient up to date with their annual eye exam?  Yes  Who is the provider or what is the name of the office in which the patient attends annual eye exams? Needs referral  If pt is not established with a provider, would they like to be referred to a provider to establish care? Yes .   Dental Screening: Recommended annual dental exams for proper oral hygiene  Diabetic Foot Exam: n/a  Community Resource Referral / Chronic Care Management: CRR required this visit?  No   CCM required this visit?  No     Plan:     I have personally reviewed and noted the following in the patient's chart:   Medical and social history Use of alcohol, tobacco or illicit drugs  Current medications and supplements including opioid prescriptions. Patient is not currently taking opioid prescriptions. Functional ability and status Nutritional status Physical activity Advanced directives List of other physicians Hospitalizations, surgeries, and ER visits in previous 12 months: hip  surgery August 2024 Vitals: unable to obtain, virtual visit.  Screenings to include cognitive, depression, and falls Referrals and appointments  In addition, I have reviewed and discussed with patient certain preventive protocols, quality metrics, and best practice recommendations. A written personalized care plan for preventive services as well as general preventive health recommendations were provided to patient.     Novella Olive, FNP   05/20/2023   After Visit Summary: (MyChart) Due to this  being a telephonic visit, the after visit summary with patients personalized plan was offered to patient via MyChart   Follow-up with PCP as scheduled.  Get T dap and Shingrix vaccines at the local pharmacy.  Will make appointment with eye doctor for yearly vision check.

## 2023-05-24 ENCOUNTER — Inpatient Hospital Stay: Payer: Medicare Other | Attending: Hematology and Oncology | Admitting: Hematology and Oncology

## 2023-05-24 VITALS — BP 137/73 | HR 68 | Temp 97.6°F | Resp 18 | Ht 64.0 in | Wt 142.7 lb

## 2023-05-24 DIAGNOSIS — Z170 Estrogen receptor positive status [ER+]: Secondary | ICD-10-CM | POA: Diagnosis not present

## 2023-05-24 DIAGNOSIS — C50411 Malignant neoplasm of upper-outer quadrant of right female breast: Secondary | ICD-10-CM | POA: Diagnosis not present

## 2023-05-24 DIAGNOSIS — Z9013 Acquired absence of bilateral breasts and nipples: Secondary | ICD-10-CM | POA: Diagnosis not present

## 2023-05-24 DIAGNOSIS — Z79811 Long term (current) use of aromatase inhibitors: Secondary | ICD-10-CM | POA: Insufficient documentation

## 2023-05-24 DIAGNOSIS — C50811 Malignant neoplasm of overlapping sites of right female breast: Secondary | ICD-10-CM | POA: Diagnosis not present

## 2023-05-24 DIAGNOSIS — Z79899 Other long term (current) drug therapy: Secondary | ICD-10-CM | POA: Insufficient documentation

## 2023-05-24 MED ORDER — ALENDRONATE SODIUM 70 MG PO TABS: 70.0000 mg | ORAL_TABLET | ORAL | 3 refills | Status: DC

## 2023-05-24 NOTE — Progress Notes (Signed)
Patient Care Team: Agapito Games, MD as PCP - General (Family Medicine) Donnelly Angelica, RN as Oncology Nurse Navigator Pershing Proud, RN as Oncology Nurse Navigator Benjamin Stain Ihor Austin, MD as Consulting Physician (Family Medicine)  DIAGNOSIS:  Encounter Diagnosis  Name Primary?   Malignant neoplasm of upper-outer quadrant of right breast in female, estrogen receptor positive (HCC) Yes    SUMMARY OF ONCOLOGIC HISTORY: Oncology History  Malignant neoplasm of upper-outer quadrant of right breast in female, estrogen receptor positive (HCC)  04/23/2018 Initial Diagnosis   Palpable lump in right breast UOQ, mammogram revealed 2 separate masses.  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  The Multi-Gene Panel offered by [facility] includes sequencing and/or deletion duplication testing of the following 85 genes: AIP, ALK, APC, ATM, AXIN2,BAP1,  BARD1, BLM, BMPR1A, BRCA1, BRCA2, BRIP1, CASR, CDC73, CDH1, CDK4, CDKN1B, CDKN1C, CDKN2A (p14ARF), CDKN2A (p16INK4a), CEBPA, CHEK2, CTNNA1, DICER1, DIS3L2, EGFR (c.2369C>T, p.Thr790Met variant only), EPCAM (Deletion/duplication testing only), FH, FLCN, GATA2, GPC3, GREM1 (Promoter region deletion/duplication testing only), HOXB13 (c.251G>A, p.Gly84Glu), HRAS, KIT, MAX, MEN1, MET, MITF (c.952G>A, p.Glu318Lys variant only), MLH1, MSH2, MSH3, MSH6, MUTYH, NBN, NF1, NF2,  NTHL1, PALB2, PDGFRA, PHOX2B, PMS2, POLD1, POLE, POT1, PRKAR1A, PTCH1, PTEN, RAD50, RAD51C, RAD51D, RB1, RECQL4, RET, RNF43, RUNX1, SDHAF2, SDHA (sequence changes only), SDHB, SDHC, SDHD, SMAD4, SMARCA4, SMARCB1, SMARCE1, STK11, SUFU, TERC, TERT, TMEM127, TP53, TSC1, TSC2, VHL, WRN and WT1.     12/24/2018 Surgery   Right lumpectomy Carolynne Edouard): residual invasive carcinoma, clear margins, and 2/3 lymph nodes positive for carcinoma.    01/14/2019 Cancer Staging   Staging form: Breast, AJCC 8th Edition - Pathologic stage from 01/14/2019: No Stage Recommended (ypT2, pN1a, cM0, G2, ER+, PR-, HER2-) - Signed by Lonie Peak, MD on 01/14/2019   01/22/2019 Miscellaneous   MammaPrint: High risk luminal type B   03/11/2019 Surgery   Bilateral mastectomies Carolynne Edouard):   Left breast: benign tissue and two negative lymph nodes Right breast: residual IDC 0.5 cm, clear margins, 2 lymph nodes negativeER 95%, PR 0%, HER-2 -1+, Ki-67 10%   04/14/2019 - 08/25/2019 Chemotherapy   Patient is on Treatment Plan : Breast AC q21 Days / PACLitaxel q7d     09/22/2019 - 10/30/2019 Radiation Therapy   Adjuvant radiation therapy   11/2019 -  Anti-estrogen oral therapy   Letrozole 2.5 mg daily     CHIEF COMPLIANT: Follow-up on letrozole therapy  HISTORY OF PRESENT ILLNESS:   History of Present Illness   Samantha Cross, a patient with a history of breast cancer and osteoporosis, presents for a routine follow-up. She has been on letrozole for her breast cancer for the past five years, with minimal side effects, mainly hot flashes. She reports no other major issues related to the medication.  She underwent hip replacement surgery in the summer due to severe osteoporosis and arthritis. She is not currently on any medication for her bone  density. She has not had a bone density test since December of the previous year, which showed poor bone health.  She also reports a persistent shoulder pain, which she attributes to an incident where a  refrigerator fell on her. The pain has been ongoing since the incident.  She is considering joining a gym as part of her new year's resolution and is curious about supplements advertised on TV for bone health. She is not currently taking any vitamins or supplements.         ALLERGIES:  is allergic to lisinopril, alendronate [alendronate], and hydroxyzine.  MEDICATIONS:  Current Outpatient Medications  Medication Sig Dispense Refill   [START ON 08/03/2023] alendronate (FOSAMAX) 70 MG tablet Take 1 tablet (70 mg total) by mouth once a week. Take with a full glass of water on an empty stomach. 12 tablet 3   amLODipine (NORVASC) 10 MG tablet TAKE 1 TABLET(10 MG) BY MOUTH DAILY 90 tablet 0   letrozole (FEMARA) 2.5 MG tablet TAKE 1 TABLET(2.5 MG) BY MOUTH DAILY 90 tablet 3   Multiple Vitamins-Minerals (HAIR SKIN AND NAILS FORMULA) TABS Take by mouth.     simvastatin (ZOCOR) 40 MG tablet TAKE 1 TABLET(40 MG) BY MOUTH AT BEDTIME 90 tablet 1   No current facility-administered medications for this visit.    PHYSICAL EXAMINATION: ECOG PERFORMANCE STATUS: 1 - Symptomatic but completely ambulatory  Vitals:   05/24/23 1012  BP: 137/73  Pulse: 68  Resp: 18  Temp: 97.6 F (36.4 C)  SpO2: 100%   Filed Weights   05/24/23 1012  Weight: 142 lb 11.2 oz (64.7 kg)      LABORATORY DATA:  I have reviewed the data as listed    Latest Ref Rng & Units 11/29/2021   12:00 AM 08/17/2020   12:00 AM 08/25/2019    2:05 PM  CMP  Glucose 65 - 99 mg/dL 93  147  829   BUN 7 - 25 mg/dL 9  11  9    Creatinine 0.60 - 1.00 mg/dL 5.62  1.30  8.65   Sodium 135 - 146 mmol/L 145  142  143   Potassium 3.5 - 5.3 mmol/L 4.0  4.0  3.4   Chloride 98 - 110 mmol/L 107  105  108   CO2 20 - 32 mmol/L 28  30  26    Calcium 8.6 - 10.4 mg/dL 9.8  9.8  8.9   Total Protein 6.1 - 8.1 g/dL 7.2  7.3  6.4   Total Bilirubin 0.2 - 1.2 mg/dL 0.8  0.8  0.6   Alkaline Phos 38 - 126 U/L   67   AST 10 - 35 U/L 14  18  14    ALT 6 - 29  U/L 11  15  14      Lab Results  Component Value Date   WBC 10.4 11/29/2021   HGB 15.2 11/29/2021   HCT 46.4 (H) 11/29/2021   MCV 92.6 11/29/2021   PLT 241 11/29/2021   NEUTROABS 4.1 08/25/2019    ASSESSMENT & PLAN:  Malignant neoplasm of upper-outer quadrant of right breast in female, estrogen receptor positive (HCC) 04/23/2018 palpable lump in right breast UOQ, mammogram revealed 2 separate masses.  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  Neoadjuvant antiestrogen therapy 05/12/2018-July 2020 3.  12/24/2018 Right lumpectomy: Residual invasive ductal carcinoma 3.8 cm, margins negative, 2/3 lymph nodes positive, ER 95%, PR 0%, HER-2 negative, Ki-67 10%  03/11/2019 bilateral mastectomies Carolynne Edouard):   Left breast: benign tissue and two negative lymph nodes Right breast: residual IDC 0.5 cm , clear margins, 2 lymph nodes negative, ER 95%, PR 0%, HER-2 -1+, Ki-67 10%   4.  MammaPrint high risk: Adjuvant chemotherapy with dose dense Adriamycin and Cytoxan followed by Taxol completed 08/25/2019 5. adjuvant radiation therapy 09/22/19-10/28/19 5.  Followed by adjuvant antiestrogen therapy with letrozole ----------------------------------------------------------------------------------------------------------------------------------------------- clinical trial SWOG  1714 ---------------------------------------------------------------------------------------------------------------------------------- Letrozole Toxicities: Tolerating it extremely well. Hip discomfort: Encouraged her to do some stretching   Breast Cancer Surveillance: 1. Breast exam 05/24/2023: Benign 2. she had bilateral mastectomies therefore there is no role of mammograms   Low back pain: Bone scan 06/05/2021: Lumbar spine degenerative disk disease. Subtle punctate area of inc activity over Right tibia (Non specific)   Breast Cancer 5 years post-diagnosis, tolerating Letrozole well with minimal side effects. -Continue Letrozole for a total of 7 years.  Osteoporosis History of hip replacement due to osteoporosis. Not currently on any bone-strengthening medication. -Start Fosamax once a week starting August 03, 2023, after dental procedures are completed and healed. -Over-the-counter Calcium (1200mg  daily) and Vitamin D (2000 IU daily) supplementation recommended.  General Health Maintenance -Encouraged to join a gym for regular exercise to improve bone density and decrease breast cancer risk. -Scheduled follow-up in 1 year.       RTC in 1 year for follow-up     No orders of the defined types were placed in this encounter.  The patient has a good understanding of the overall plan. she agrees with it. she will call with any problems that may develop before the next visit here. Total time spent: 30 mins including face to face time and time spent for planning, charting and co-ordination of care   Tamsen Meek, MD 05/24/23

## 2023-05-24 NOTE — Assessment & Plan Note (Signed)
04/23/2018 palpable lump in right breast UOQ, mammogram revealed 2 separate masses.  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  Neoadjuvant antiestrogen therapy 05/12/2018-July 2020 3.  12/24/2018 Right lumpectomy: Residual invasive ductal carcinoma 3.8 cm, margins negative, 2/3 lymph nodes positive, ER 95%, PR 0%, HER-2 negative, Ki-67 10%  03/11/2019 bilateral mastectomies Carolynne Edouard):   Left breast: benign tissue and two negative lymph nodes Right breast: residual IDC 0.5 cm , clear margins, 2 lymph nodes negative, ER 95%, PR 0%, HER-2 -1+, Ki-67 10%   4.  MammaPrint high risk: Adjuvant chemotherapy with dose dense Adriamycin and Cytoxan followed by Taxol completed 08/25/2019 5. adjuvant radiation therapy 09/22/19-10/28/19 5.  Followed by adjuvant antiestrogen therapy with letrozole ----------------------------------------------------------------------------------------------------------------------------------------------- clinical trial SWOG 1714 ---------------------------------------------------------------------------------------------------------------------------------- Letrozole Toxicities: Tolerating it extremely well. Hip discomfort: Encouraged her to do some stretching   Breast Cancer Surveillance: 1. Breast exam 05/24/2023: Benign 2. she had bilateral mastectomies therefore there is no role of mammograms   Low  back pain: Bone scan 06/05/2021: Lumbar spine degenerative disk disease. Subtle punctate area of inc activity over Right tibia (Non specific)   RTC in 1 year for follow-up

## 2023-05-27 ENCOUNTER — Telehealth: Payer: Self-pay

## 2023-05-27 ENCOUNTER — Other Ambulatory Visit: Payer: Self-pay | Admitting: Hematology and Oncology

## 2023-05-27 NOTE — Telephone Encounter (Signed)
Pt called and states she is allergic to Alendronate. She reports angioedema and tongue swelling. She was offered Prolia per MD and she states she would like to do research and get back to [place]. Per pt request, education sent via mychart to pt.

## 2023-06-18 DIAGNOSIS — K080 Exfoliation of teeth due to systemic causes: Secondary | ICD-10-CM | POA: Diagnosis not present

## 2023-07-10 ENCOUNTER — Other Ambulatory Visit: Payer: Self-pay | Admitting: Hematology and Oncology

## 2023-07-10 ENCOUNTER — Telehealth: Payer: Self-pay | Admitting: Hematology and Oncology

## 2023-07-10 NOTE — Telephone Encounter (Signed)
 Samantha Cross

## 2023-07-18 DIAGNOSIS — K080 Exfoliation of teeth due to systemic causes: Secondary | ICD-10-CM | POA: Diagnosis not present

## 2023-08-02 DIAGNOSIS — K080 Exfoliation of teeth due to systemic causes: Secondary | ICD-10-CM | POA: Diagnosis not present

## 2023-08-10 ENCOUNTER — Other Ambulatory Visit: Payer: Self-pay | Admitting: Family Medicine

## 2023-08-10 DIAGNOSIS — I10 Essential (primary) hypertension: Secondary | ICD-10-CM

## 2023-08-10 DIAGNOSIS — E7800 Pure hypercholesterolemia, unspecified: Secondary | ICD-10-CM

## 2023-10-25 ENCOUNTER — Other Ambulatory Visit: Payer: Self-pay | Admitting: Family Medicine

## 2023-10-25 DIAGNOSIS — I10 Essential (primary) hypertension: Secondary | ICD-10-CM

## 2024-01-27 ENCOUNTER — Other Ambulatory Visit: Payer: Self-pay | Admitting: Family Medicine

## 2024-01-27 DIAGNOSIS — E7800 Pure hypercholesterolemia, unspecified: Secondary | ICD-10-CM

## 2024-01-27 DIAGNOSIS — I10 Essential (primary) hypertension: Secondary | ICD-10-CM

## 2024-02-04 ENCOUNTER — Encounter: Payer: Self-pay | Admitting: Sports Medicine

## 2024-03-30 ENCOUNTER — Ambulatory Visit: Payer: Self-pay

## 2024-03-30 DIAGNOSIS — M7542 Impingement syndrome of left shoulder: Secondary | ICD-10-CM

## 2024-03-30 DIAGNOSIS — M5416 Radiculopathy, lumbar region: Secondary | ICD-10-CM

## 2024-03-30 NOTE — Telephone Encounter (Signed)
 Copied from CRM [id number]. Topic: Clinical - Red Word Triage >> Mar 30, 2024  4:40 PM Mercer PEDLAR wrote: Red Word that prompted transfer to Nurse Triage: Alexus (patient's nurse) is calling from [facility] stating that patient is having redness and swelling in her knee and difficulty walking. Reason for Disposition  [1] MILD pain (e.g., does not interfere with normal activities) AND [2] present > 7 days  Answer Assessment - Initial Assessment Questions Pt's nurse states that pt told her that she has pain on the side of her knee and pt thinks it's bursitis from [place] the beach. Pt has been icing an elevating it. The nurse's company doesn't allow them to give recommendations on any over the counters, the pts PCP has to do that. Nurse states that pt staets pain is getting better. She is getting ready to leave town on 10/31. RN advised we could see patient if pt wanted to be evaluated. Rn attempted to call pt to give care advise and schedule if she'd like. Pt did not answer. LVM for patient to return.    1. LOCATION and RADIATION: Where is the pain located?      Nurse said pt didn't tell her which knee just the outside of the knee 2. QUALITY: What does the pain feel like?  (e.g., sharp, dull, aching, burning)     unknown 3. SEVERITY: How bad is the pain? What does it keep you from doing?   (Scale 1-10; or mild, moderate, severe)     unknown 4. ONSET: When did the pain start? Does it come and go, or is it there all the time?     10/20 5. RECURRENT: Have you had this pain before? If Yes, ask: When, and what happened then?     unknown 6. SETTING: Has there been any recent work, exercise or other activity that involved that part of the body?      Pt had been walking around the beach.  7. AGGRAVATING FACTORS: What makes the knee pain worse? (e.g., walking, climbing stairs, running)     unknown 8. ASSOCIATED SYMPTOMS: Is there any swelling or redness of the knee?     Side of the  knee  9. OTHER SYMPTOMS: Do you have any other symptoms? (e.g., calf pain, chest pain, difficulty breathing, fever)     Pt denies per nurse.  Protocols used: Knee Pain-A-AH

## 2024-03-30 NOTE — Telephone Encounter (Signed)
 Second attempt to reach patient for triage/care advice. LVM for patient to return call to [phone]. Call routed to Primary Care and Sports Medicine in [place] for follow up

## 2024-04-01 NOTE — Telephone Encounter (Signed)
 Ok to schedule with Dr. Charles in [place] if we can get in touch with her

## 2024-04-02 NOTE — Addendum Note (Signed)
 Addended byBETHA DUWAINE RIGGS on: 04/02/2024 09:24 AM   Modules accepted: Orders

## 2024-04-29 ENCOUNTER — Other Ambulatory Visit: Payer: Self-pay | Admitting: Family Medicine

## 2024-04-29 ENCOUNTER — Ambulatory Visit (INDEPENDENT_AMBULATORY_CARE_PROVIDER_SITE_OTHER): Admitting: Family Medicine

## 2024-04-29 ENCOUNTER — Encounter: Payer: Self-pay | Admitting: Family Medicine

## 2024-04-29 VITALS — BP 134/83 | HR 70 | Ht 64.0 in | Wt 141.0 lb

## 2024-04-29 DIAGNOSIS — M545 Low back pain, unspecified: Secondary | ICD-10-CM | POA: Diagnosis not present

## 2024-04-29 DIAGNOSIS — R319 Hematuria, unspecified: Secondary | ICD-10-CM

## 2024-04-29 DIAGNOSIS — K5900 Constipation, unspecified: Secondary | ICD-10-CM

## 2024-04-29 DIAGNOSIS — R1032 Left lower quadrant pain: Secondary | ICD-10-CM | POA: Diagnosis not present

## 2024-04-29 DIAGNOSIS — I10 Essential (primary) hypertension: Secondary | ICD-10-CM

## 2024-04-29 DIAGNOSIS — R6883 Chills (without fever): Secondary | ICD-10-CM

## 2024-04-29 DIAGNOSIS — R8290 Unspecified abnormal findings in urine: Secondary | ICD-10-CM

## 2024-04-29 DIAGNOSIS — N3001 Acute cystitis with hematuria: Secondary | ICD-10-CM

## 2024-04-29 LAB — POCT URINALYSIS DIP (CLINITEK)
Glucose, UA: NEGATIVE mg/dL
Nitrite, UA: POSITIVE — AB
POC PROTEIN,UA: 100 — AB
Spec Grav, UA: >=1.03 — AB (ref 1.010–1.025)
Urobilinogen, UA: 2 U/dL — AB
pH, UA: 5.5 (ref 5.0–8.0)

## 2024-04-29 MED ORDER — NITROFURANTOIN MONOHYD MACRO 100 MG PO CAPS: 100.0000 mg | ORAL_CAPSULE | Freq: Two times a day (BID) | ORAL | 0 refills | Status: DC

## 2024-04-29 NOTE — Patient Instructions (Signed)
 Please start Mirlax - mix 1 capful with 6 oz of water and drink at bedtime each night until soft mushy bowel movement  We will send the urine for a culture.

## 2024-04-29 NOTE — Progress Notes (Signed)
 Pt reports that last week she had low back pain and LL abdominal pain that caused nausea and vomiting.   She took Celebrex  that did help her with the pain however, it did cause some constipation.

## 2024-04-29 NOTE — Progress Notes (Signed)
 Established Patient Office Visit  Patient ID: Samantha Cross, female    DOB: 22-Jul-1945  Age: 78 y.o. MRN: [id number] PCP: Alvan Samantha BIRCH, MD  Chief Complaint  Patient presents with   not feeling well    Subjective:     HPI  Discussed the use of AI scribe software for clinical note transcription with the patient, who gave verbal consent to proceed.  History of Present Illness Samantha Cross is a 78 year old female who presents with back pain and urinary symptoms.  Acute back pain - Sudden onset back pain for the past week, worse on the left.  - Initially more pronounced on the left side, now bilateral - Pain severe enough to induce vomiting after taking Celebrex  - Second dose of Celebrex  after vomiting provided some relief, but pain persisted at a less severe intensity - No additional pain medication taken since the weekend  Gastrointestinal symptoms - Difficulty with bowel movements, described as 'little pebbles' and not substantial - No abdominal pain or gastrointestinal bleeding reported  Urinary symptoms - Straining to empty bladder - Urine color and odor changes: sometimes clear, other times dark with strong odor - No dysuria or hematuria - Increased water intake  Constitutional symptoms - No fever - Chills and feeling very cold over the weekend     ROS    Objective:     BP 134/83   Pulse 70   Ht 5' 4 (1.626 m)   Wt 141 lb (64 kg)   SpO2 98%   BMI 24.20 kg/m    Physical Exam Vitals and nursing note reviewed.  Constitutional:      Appearance: Normal appearance.  HENT:     Head: Normocephalic and atraumatic.  Eyes:     Conjunctiva/sclera: Conjunctivae normal.  Cardiovascular:     Rate and Rhythm: Normal rate and regular rhythm.  Pulmonary:     Effort: Pulmonary effort is normal.     Breath sounds: Normal breath sounds.  Abdominal:     General: Bowel sounds are normal. There is no distension.     Palpations:  Abdomen is soft. There is no mass.     Tenderness: There is no abdominal tenderness.  Musculoskeletal:     Comments: Normal lumbar flexion, extension, and rotation right and left.   Tender over the left SI joint.   O/W non tender  Skin:    General: Skin is warm and dry.  Neurological:     Mental Status: She is alert.  Psychiatric:        Mood and Affect: Mood normal.      Results for orders placed or performed in visit on 04/29/24  POCT URINALYSIS DIP (CLINITEK)  Result Value Ref Range   Color, UA yellow yellow   Clarity, UA clear clear   Glucose, UA negative negative mg/dL   Bilirubin, UA small (A) negative   Ketones, POC UA trace (5) (A) negative mg/dL   Spec Grav, UA >=8.969 (A) 1.010 - 1.025   Blood, UA moderate (A) negative   pH, UA 5.5 5.0 - 8.0   POC PROTEIN,UA =100 (A) negative, trace   Urobilinogen, UA 2.0 (A) 0.2 or 1.0 E.U./dL   Nitrite, UA Positive (A) Negative   Leukocytes, UA Small (1+) (A) Negative      The 10-year ASCVD risk score (Arnett DK, et al., 2019) is: 26%    Assessment & Plan:   Problem List Items Addressed This Visit   None Visit Diagnoses  Abnormal urine odor    -  Primary   Relevant Orders   POCT URINALYSIS DIP (CLINITEK) (Completed)   Urine Culture     Hematuria, unspecified type       Relevant Orders   Urine Culture     Left lower quadrant abdominal pain       Relevant Orders   CBC with Differential/Platelet   CMP14+EGFR     Acute left-sided low back pain without sciatica       Relevant Orders   CBC with Differential/Platelet   CMP14+EGFR     Chills       Relevant Orders   CBC with Differential/Platelet   CMP14+EGFR     Acute cystitis with hematuria       Relevant Medications   nitrofurantoin , macrocrystal-monohydrate, (MACROBID ) 100 MG capsule     Constipation, unspecified constipation type           Assessment and Plan Assessment & Plan Urinary tract infection Suspected UTI with dysuria, chills, and  radiating pain. No fever. Diverticulitis less likely. - Prescribed antibiotic. - Sent urine for culture. - Ordered CBC.  Constipation Reports constipation with small, pebble-like stools. No recent bowel movements.  Low back pain with SI joint tenderness Low back pain with SI joint tenderness. Pain rated 1/10. Diverticulitis unlikely. - Provided SI joint pain exercises handout.  Seborrheic keratoses Seborrheic keratoses present, some irritated but not bothersome. Previous lesion healing. - Offered option to freeze larger lesions at future visit. .  If not improving consider other causes and recommend additional workup . Hx of BrCA   Return if symptoms worsen or fail to improve.    Samantha Byars, MD [facility] at [facility]

## 2024-04-30 LAB — CBC WITH DIFFERENTIAL/PLATELET
Basophils Absolute: 0.1 x10E3/uL (ref 0.0–0.2)
Basos: 1 %
EOS (ABSOLUTE): 0.1 x10E3/uL (ref 0.0–0.4)
Eos: 1 %
Hematocrit: 47 % — ABNORMAL HIGH (ref 34.0–46.6)
Hemoglobin: 15.1 g/dL (ref 11.1–15.9)
Immature Grans (Abs): 0 x10E3/uL (ref 0.0–0.1)
Immature Granulocytes: 0 %
Lymphocytes Absolute: 1.4 x10E3/uL (ref 0.7–3.1)
Lymphs: 27 %
MCH: 29.7 pg (ref 26.6–33.0)
MCHC: 32.1 g/dL (ref 31.5–35.7)
MCV: 92 fL (ref 79–97)
Monocytes Absolute: 0.8 x10E3/uL (ref 0.1–0.9)
Monocytes: 16 %
Neutrophils Absolute: 2.7 x10E3/uL (ref 1.4–7.0)
Neutrophils: 55 %
Platelets: 243 x10E3/uL (ref 150–450)
RBC: 5.09 x10E6/uL (ref 3.77–5.28)
RDW: 12.3 % (ref 11.7–15.4)
WBC: 5 x10E3/uL (ref 3.4–10.8)

## 2024-04-30 LAB — CMP14+EGFR
ALT: 16 IU/L (ref 0–32)
AST: 21 IU/L (ref 0–40)
Albumin: 4.3 g/dL (ref 3.8–4.8)
Alkaline Phosphatase: 89 IU/L (ref 49–135)
BUN/Creatinine Ratio: 12 (ref 12–28)
BUN: 9 mg/dL (ref 8–27)
Bilirubin Total: 0.7 mg/dL (ref 0.0–1.2)
CO2: 23 mmol/L (ref 20–29)
Calcium: 9.8 mg/dL (ref 8.7–10.3)
Chloride: 99 mmol/L (ref 96–106)
Creatinine, Ser: 0.73 mg/dL (ref 0.57–1.00)
Globulin, Total: 3.2 g/dL (ref 1.5–4.5)
Glucose: 92 mg/dL (ref 70–99)
Potassium: 3.7 mmol/L (ref 3.5–5.2)
Sodium: 137 mmol/L (ref 134–144)
Total Protein: 7.5 g/dL (ref 6.0–8.5)
eGFR: 85 mL/min/1.73 (ref >59)

## 2024-05-04 ENCOUNTER — Ambulatory Visit: Payer: Self-pay | Admitting: Family Medicine

## 2024-05-04 DIAGNOSIS — N3001 Acute cystitis with hematuria: Secondary | ICD-10-CM

## 2024-05-04 LAB — URINE CULTURE

## 2024-05-04 MED ORDER — NITROFURANTOIN MONOHYD MACRO 100 MG PO CAPS: 100.0000 mg | ORAL_CAPSULE | Freq: Two times a day (BID) | ORAL | 0 refills | Status: DC

## 2024-05-04 NOTE — Telephone Encounter (Signed)
 Sent in 3 more days worth

## 2024-05-04 NOTE — Progress Notes (Signed)
 HI Calli,  Urine culture came back growing out E. coli.  The antibiotic that I put you on should work really well to clear this up so hopefully you are noticing that your symptoms have resolved.

## 2024-05-25 ENCOUNTER — Inpatient Hospital Stay: Payer: Medicare Other | Attending: Hematology and Oncology | Admitting: Hematology and Oncology

## 2024-05-25 VITALS — BP 128/78 | HR 71 | Temp 97.5°F | Resp 18 | Ht 64.0 in | Wt 140.6 lb

## 2024-05-25 DIAGNOSIS — Z809 Family history of malignant neoplasm, unspecified: Secondary | ICD-10-CM | POA: Diagnosis not present

## 2024-05-25 DIAGNOSIS — Z9013 Acquired absence of bilateral breasts and nipples: Secondary | ICD-10-CM | POA: Insufficient documentation

## 2024-05-25 DIAGNOSIS — Z1722 Progesterone receptor negative status: Secondary | ICD-10-CM | POA: Insufficient documentation

## 2024-05-25 DIAGNOSIS — Z170 Estrogen receptor positive status [ER+]: Secondary | ICD-10-CM | POA: Insufficient documentation

## 2024-05-25 DIAGNOSIS — C50411 Malignant neoplasm of upper-outer quadrant of right female breast: Secondary | ICD-10-CM

## 2024-05-25 DIAGNOSIS — Z79811 Long term (current) use of aromatase inhibitors: Secondary | ICD-10-CM | POA: Diagnosis not present

## 2024-05-25 DIAGNOSIS — C50811 Malignant neoplasm of overlapping sites of right female breast: Secondary | ICD-10-CM | POA: Diagnosis present

## 2024-05-25 DIAGNOSIS — Z1732 Human epidermal growth factor receptor 2 negative status: Secondary | ICD-10-CM | POA: Insufficient documentation

## 2024-05-25 DIAGNOSIS — Z79899 Other long term (current) drug therapy: Secondary | ICD-10-CM | POA: Diagnosis not present

## 2024-05-25 DIAGNOSIS — M810 Age-related osteoporosis without current pathological fracture: Secondary | ICD-10-CM | POA: Insufficient documentation

## 2024-05-25 NOTE — Progress Notes (Signed)
 "  Patient Care Team: Alvan Dorothyann BIRCH, MD as PCP - General (Family Medicine) Tyree Nanetta SAILOR, RN as Oncology Nurse Navigator Curtis Debby PARAS, MD as Consulting Physician (Family Medicine)  DIAGNOSIS:  Encounter Diagnosis  Name Primary?   Malignant neoplasm of upper-outer quadrant of right breast in female, estrogen receptor positive (HCC) Yes    SUMMARY OF ONCOLOGIC HISTORY: Oncology History  Malignant neoplasm of upper-outer quadrant of right breast in female, estrogen receptor positive (HCC)  04/23/2018 Initial Diagnosis   Palpable lump in right breast UOQ, mammogram revealed 2 separate masses.  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, ER 95%, PR 0%, Ki-67 10%, HER-2 negative IHC 1+ T3 N0 stage 3A   05/12/2018 Cancer Staging   Staging form: Breast, AJCC 8th Edition - Clinical: Stage IIIA (cT3(m), cN0, cM0, G3, ER+, PR-, HER2-) - Signed by Odean Potts, MD on 05/12/2018   05/12/2018 -  Anti-estrogen oral therapy   Neoadjuvant therapy with letrozole  2.5 mg daily; restarted adjuvantly 11/17/19, planned duration 7 years   05/18/2018 Genetic Testing   SDHA c.955A>C VUS but otherwise negative testing.  The Multi-Gene Panel offered by [facility] includes sequencing and/or deletion duplication testing of the following 85 genes: AIP, ALK, APC, ATM, AXIN2,BAP1,  BARD1, BLM, BMPR1A, BRCA1, BRCA2, BRIP1, CASR, CDC73, CDH1, CDK4, CDKN1B, CDKN1C, CDKN2A (p14ARF), CDKN2A (p16INK4a), CEBPA, CHEK2, CTNNA1, DICER1, DIS3L2, EGFR (c.2369C>T, p.Thr790Met variant only), EPCAM (Deletion/duplication testing only), FH, FLCN, GATA2, GPC3, GREM1 (Promoter region deletion/duplication testing only), HOXB13 (c.251G>A, p.Gly84Glu), HRAS, KIT, MAX, MEN1, MET, MITF (c.952G>A, p.Glu318Lys variant only), MLH1, MSH2, MSH3, MSH6, MUTYH, NBN, NF1, NF2, NTHL1, PALB2, PDGFRA, PHOX2B, PMS2, POLD1, POLE,  POT1, PRKAR1A, PTCH1, PTEN, RAD50, RAD51C, RAD51D, RB1, RECQL4, RET, RNF43, RUNX1, SDHAF2, SDHA (sequence changes only), SDHB, SDHC, SDHD, SMAD4, SMARCA4, SMARCB1, SMARCE1, STK11, SUFU, TERC, TERT, TMEM127, TP53, TSC1, TSC2, VHL, WRN and WT1.     12/24/2018 Surgery   Right lumpectomy [facility]): residual invasive carcinoma, clear margins, and 2/3 lymph nodes positive for carcinoma.    01/14/2019 Cancer Staging   Staging form: Breast, AJCC 8th Edition - Pathologic stage from 01/14/2019: No Stage Recommended (ypT2, pN1a, cM0, G2, ER+, PR-, HER2-) - Signed by Izell Domino, MD on 01/14/2019   01/22/2019 Miscellaneous   MammaPrint: High risk luminal type B   03/11/2019 Surgery   Bilateral mastectomies [facility]):   Left breast: benign tissue and two negative lymph nodes Right breast: residual IDC 0.5 cm, clear margins, 2 lymph nodes negativeER 95%, PR 0%, HER-2 -1+, Ki-67 10%   04/14/2019 - 08/25/2019 Chemotherapy   Patient is on Treatment Plan : Breast AC q21 Days / PACLitaxel  q7d     09/22/2019 - 10/30/2019 Radiation Therapy   Adjuvant radiation therapy   11/2019 -  Anti-estrogen oral therapy   Letrozole  2.5 mg daily     CHIEF COMPLIANT: Follow-up on letrozole  therapy  HISTORY OF PRESENT ILLNESS:  History of Present Illness Samantha Cross is a 78 year old female with 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  She was diagnosed in late 2019 with high-risk luminal B invasive lobular carcinoma of the right upper-outer quadrant and was treated with neoadjuvant letrozole , lumpectomy, bilateral mastectomies, adjuvant chemotherapy, and radiation, completing therapy in May 2021. She has taken adjuvant letrozole  since June 2021, with four and a half years completed and about two  and a half years remaining, and she has no breast pain or palpable masses.  She has severe nocturnal xerostomia  that disrupts sleep and keeps water at her bedside. She currently takes letrozole  at night and is considering switching to morning dosing to see if this improves symptoms. She denies weight loss and otherwise feels well.  She has significant anxiety about cancer recurrence, worsened by a strong family history of malignancy and the recent death of her sister from breast cancer, and she is interested in additional surveillance.  She had a hip replacement two years ago for osteoporosis and previously had hip discomfort, but she has no current musculoskeletal complaints. A recent urinary tract infection treated with Macrobid  has resolved.  She had mastectomy without reconstruction and describes some regret about not pursuing reconstruction but ultimately feels at peace with this choice.       ALLERGIES:  is allergic to lisinopril, alendronate  [alendronate ], and hydroxyzine .  MEDICATIONS:  Current Outpatient Medications  Medication Sig Dispense Refill   amLODipine  (NORVASC ) 10 MG tablet TAKE 1 TABLET(10 MG) BY MOUTH DAILY 90 tablet 0   letrozole  (FEMARA ) 2.5 MG tablet TAKE 1 TABLET(2.5 MG) BY MOUTH DAILY 90 tablet 3   Multiple Vitamins-Minerals (HAIR SKIN AND NAILS FORMULA) TABS Take by mouth.     nitrofurantoin , macrocrystal-monohydrate, (MACROBID ) 100 MG capsule Take 1 capsule (100 mg total) by mouth 2 (two) times daily. 6 capsule 0   simvastatin  (ZOCOR ) 40 MG tablet TAKE 1 TABLET(40 MG) BY MOUTH AT BEDTIME 90 tablet 1   No current facility-administered medications for this visit.    PHYSICAL EXAMINATION: ECOG PERFORMANCE STATUS: 1 - Symptomatic but completely ambulatory  Vitals:   05/25/24 0900  BP: 128/78  Pulse: 71  Resp: 18  Temp: (!) 97.5 F (36.4 C)  SpO2: 100%   Filed Weights   05/25/24 0900  Weight: 140 lb 9.6 oz (63.8 kg)   Breast exam: Benign  LABORATORY DATA:  I have reviewed the data as listed    Latest Ref Rng & Units 04/29/2024   12:00 AM 11/29/2021   12:00  AM 08/17/2020   12:00 AM  CMP  Glucose 70 - 99 mg/dL 92  93  897   BUN 8 - 27 mg/dL 9  9  11    Creatinine 0.57 - 1.00 mg/dL 9.26  9.37  9.35   Sodium 134 - 144 mmol/L 137  145  142   Potassium 3.5 - 5.2 mmol/L 3.7  4.0  4.0   Chloride 96 - 106 mmol/L 99  107  105   CO2 20 - 29 mmol/L 23  28  30    Calcium 8.7 - 10.3 mg/dL 9.8  9.8  9.8   Total Protein 6.0 - 8.5 g/dL 7.5  7.2  7.3   Total Bilirubin 0.0 - 1.2 mg/dL 0.7  0.8  0.8   Alkaline Phos 49 - 135 IU/L 89     AST 0 - 40 IU/L 21  14  18    ALT 0 - 32 IU/L 16  11  15      Lab Results  Component Value Date   WBC 5.0 04/29/2024   HGB 15.1 04/29/2024   HCT 47.0 (H) 04/29/2024   MCV 92 04/29/2024   PLT 243 04/29/2024   NEUTROABS 2.7 04/29/2024    ASSESSMENT & PLAN:  Malignant neoplasm of upper-outer quadrant of right breast in female, estrogen receptor positive (HCC) 04/23/2018 palpable lump in right breast UOQ, mammogram revealed 2 separate masses.  Larger  at 12 o'clock position 5.3 cm and smaller at 10 o'clock position 2 cm, biopsy revealed invasive lobular carcinoma grade 2-3 with LCIS intermediate grade, ER 95%, PR 0%, Ki-67 10%, HER-2 negative IHC 1+ T3 N0 stage 3A   MRI breast 05/19/2018: 5.7 x 4.9 x 3.3 cm mass central right breast, no lymph nodes 05/22/2018: CT CAP: Right renal lesion 06/06/2017: MRI abdomen: Complex cystic lesion right kidney, probably benign but requires follow-up in 6 months   Right lumpectomy: Residual invasive ductal carcinoma 3.8 cm, margins negative, 2/3 lymph nodes positive, ER 95%, PR 0%, HER-2 negative, Ki-67 10%   Treatment plan: 1.  Neoadjuvant antiestrogen therapy 05/12/2018-July 2020 3.  12/24/2018 Right lumpectomy: Residual invasive ductal carcinoma 3.8 cm, margins negative, 2/3 lymph nodes positive, ER 95%, PR 0%, HER-2 negative, Ki-67 10%  03/11/2019 bilateral mastectomies [facility]):   Left breast: benign tissue and two negative lymph nodes Right breast: residual IDC 0.5 cm , clear margins, 2  lymph nodes negative, ER 95%, PR 0%, HER-2 -1+, Ki-67 10%   4.  MammaPrint high risk: Adjuvant chemotherapy with dose dense Adriamycin  and Cytoxan  followed by Taxol  completed 08/25/2019 5. adjuvant radiation therapy 09/22/19-10/28/19 5.  Followed by adjuvant antiestrogen therapy with letrozole  x 7 years started June 2021 ----------------------------------------------------------------------------------------------------------------------------------------------- clinical trial SWOG 1714 ---------------------------------------------------------------------------------------------------------------------------------- Letrozole  Toxicities: Tolerating it extremely well. Hip discomfort: Encouraged her to do some stretching   Breast Cancer Surveillance: 1. Breast exam 05/25/2024: Benign 2. she had bilateral mastectomies therefore there is no role of mammograms   Low back pain: Bone scan 06/05/2021: Lumbar spine degenerative disk disease. Subtle punctate area of inc activity over Right tibia (Non specific)  Osteoporosis: Fosamax  started August 03, 2023 along with calcium and vitamin D Recommended guardant reveal for MRD testing Return to clinic in 1 year for follow-up     No orders of the defined types were placed in this encounter.  The patient has a good understanding of the overall plan. she agrees with it. she will call with any problems that may develop before the next visit here.  I personally spent a total of 30 minutes in the care of the patient today including preparing to see the patient, getting/reviewing separately obtained history, performing a medically appropriate exam/evaluation, counseling and educating, placing orders, referring and communicating with other health care professionals, documenting clinical information in the EHR, independently interpreting results, communicating results, and coordinating care.   Viinay K Sharonica Kraszewski, MD 05/25/2024    "

## 2024-05-25 NOTE — Assessment & Plan Note (Signed)
 04/23/2018 palpable lump in right breast UOQ, mammogram revealed 2 separate masses.  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  Neoadjuvant antiestrogen therapy 05/12/2018-July 2020 3.  12/24/2018 Right lumpectomy: Residual invasive ductal carcinoma 3.8 cm, margins negative, 2/3 lymph nodes positive, ER 95%, PR 0%, HER-2 negative, Ki-67 10%  03/11/2019 bilateral mastectomies [facility]):   Left breast: benign tissue and two negative lymph nodes Right breast: residual IDC 0.5 cm , clear margins, 2 lymph nodes negative, ER 95%, PR 0%, HER-2 -1+, Ki-67 10%   4.  MammaPrint high risk: Adjuvant chemotherapy with dose dense Adriamycin  and Cytoxan  followed by Taxol  completed 08/25/2019 5. adjuvant radiation therapy 09/22/19-10/28/19 5.  Followed by adjuvant antiestrogen therapy with letrozole  x 7 years started June 2021 ----------------------------------------------------------------------------------------------------------------------------------------------- clinical trial SWOG 1714 ---------------------------------------------------------------------------------------------------------------------------------- Letrozole  Toxicities: Tolerating it extremely well. Hip discomfort: Encouraged her to do some stretching   Breast Cancer Surveillance: 1. Breast exam 05/25/2024: Benign 2. she had bilateral mastectomies therefore there is  no role of mammograms   Low back pain: Bone scan 06/05/2021: Lumbar spine degenerative disk disease. Subtle punctate area of inc activity over Right tibia (Non specific)  Osteoporosis: Fosamax  started August 03, 2023 along with calcium and vitamin D  Return to clinic in 1 year for follow-up

## 2024-05-27 ENCOUNTER — Telehealth: Payer: Self-pay

## 2024-05-27 NOTE — Telephone Encounter (Signed)
 Per md orders entered for [employer] and all supported documents were uploaded through the portal.

## 2024-05-29 NOTE — Telephone Encounter (Signed)
 Enter in error

## 2024-06-23 ENCOUNTER — Ambulatory Visit: Payer: Self-pay

## 2024-06-23 NOTE — Telephone Encounter (Signed)
 ED advised: pt reporting severe constant HA with nausea and poor appetite x 5 days despite taking OTC medicine, also reports R flank pain and cloudy urine.   FYI Only or Action Required?: Action required by provider: update on patient condition.  Patient was last seen in primary care on 04/29/2024 by Alvan Dorothyann BIRCH, MD.  Called Nurse Triage reporting Headache and Fever.  Symptoms began several days ago.  Interventions attempted: OTC medications: aspirin, ibuprofen.  Symptoms are: gradually worsening.  Triage Disposition: Go to ED Now (or PCP Triage)  Patient/caregiver understands and will follow disposition?: Yes   Reason for Triage: patient said she has been experiencing a severe headache with nausea. No appetite for 3 days and yesterday was the first day she has eaten. She had some fever and pain in both ears  Reason for Disposition  [1] SEVERE headache (e.g., excruciating) AND [2] fever  Answer Assessment - Initial Assessment Questions 1. LOCATION: Where does it hurt?      Starts at top of forehead and around my ears, around my neck.   2. ONSET: When did the headache start? (e.g., minutes, hours, days)      5 days  3. PATTERN: Does the pain come and go, or has it been constant since it started?     Constant  4. SEVERITY: How bad is the pain? and What does it keep you from doing?  (e.g., Scale 1-10; mild, moderate, or severe)     8/10, has been taking ibuprofen, peppermint oil , sea salt under her tongue, aspirin, all of which have been ineffective. Denies neck stiffness  5. RECURRENT SYMPTOM: Have you ever had headaches before? If Yes, ask: When was the last time? and What happened that time?      Denies  6. CAUSE: What do you think is causing the headache?     Unsure  7. MIGRAINE: Have you been diagnosed with migraine headaches? If Yes, ask: Is this headache similar?      Denies  8. HEAD INJURY: Has there been any recent injury to  your head?      Denies  9. OTHER SYMPTOMS: Do you have any other symptoms? (e.g., fever, stiff neck, eye pain, sore throat, cold symptoms) Weak, nausea, fever, poor appetite  Answer Assessment - Initial Assessment Questions 1. LOCATION: Where does it hurt? (e.g., left, right)     R flank pain    6. OTHER SYMPTOMS:  Do you have any other symptoms? (e.g., fever, abdomen pain, vomiting, leg weakness, burning with urination, blood in urine)     Dry mouth  Protocols used: Headache-A-AH, Flank Pain-A-AH

## 2024-06-25 ENCOUNTER — Telehealth: Payer: Self-pay | Admitting: Family Medicine

## 2024-06-25 DIAGNOSIS — N390 Urinary tract infection, site not specified: Secondary | ICD-10-CM

## 2024-06-25 NOTE — Telephone Encounter (Signed)
 Copied from CRM #[id number]. Topic: Referral - Request for Referral >> Jun 25, 2024 10:56 AM Treva T wrote: Did the patient discuss referral with their provider in the last year? Yes (If No - schedule appointment) (If Yes - send message)  Appointment offered? Yes  Type of order/referral and detailed reason for visit: Recurrent UTI's  Preference of office, provider, location:  Urology  [place] location  If referral order, have you been seen by this specialty before? No (If Yes, this issue or another issue? When? Where?  Can we respond through MyChart? No, pt prefers a phone call at [phone]

## 2024-06-26 NOTE — Addendum Note (Signed)
 Addended by: FREYA BASCOM CROME on: 06/26/2024 09:22 AM   Modules accepted: Orders

## 2024-06-26 NOTE — Telephone Encounter (Signed)
 Referral placed, called and LVM advising pt of referral being placed

## 2024-07-03 ENCOUNTER — Ambulatory Visit: Admitting: Urgent Care

## 2024-07-03 VITALS — BP 112/68 | HR 66 | Temp 97.6°F | Ht 64.0 in | Wt 139.0 lb

## 2024-07-03 DIAGNOSIS — N390 Urinary tract infection, site not specified: Secondary | ICD-10-CM | POA: Diagnosis not present

## 2024-07-03 DIAGNOSIS — K5730 Diverticulosis of large intestine without perforation or abscess without bleeding: Secondary | ICD-10-CM | POA: Diagnosis not present

## 2024-07-03 DIAGNOSIS — N289 Disorder of kidney and ureter, unspecified: Secondary | ICD-10-CM | POA: Diagnosis not present

## 2024-07-03 DIAGNOSIS — R159 Full incontinence of feces: Secondary | ICD-10-CM

## 2024-07-03 DIAGNOSIS — N2889 Other specified disorders of kidney and ureter: Secondary | ICD-10-CM | POA: Diagnosis not present

## 2024-07-03 DIAGNOSIS — R1030 Lower abdominal pain, unspecified: Secondary | ICD-10-CM | POA: Diagnosis not present

## 2024-07-03 DIAGNOSIS — R198 Other specified symptoms and signs involving the digestive system and abdomen: Secondary | ICD-10-CM | POA: Diagnosis not present

## 2024-07-03 LAB — POCT URINALYSIS DIP (CLINITEK)
Bilirubin, UA: NEGATIVE
Glucose, UA: NEGATIVE mg/dL
Ketones, POC UA: NEGATIVE mg/dL
Nitrite, UA: NEGATIVE
POC PROTEIN,UA: NEGATIVE
Spec Grav, UA: 1.02
Urobilinogen, UA: 0.2 U/dL
pH, UA: 5.5

## 2024-07-03 NOTE — Progress Notes (Unsigned)
" ° °  Established Patient Office Visit  Subjective:  Patient ID: Samantha Cross, female    DOB: 1945-07-05  Age: 79 y.o. MRN: [id number]  Chief Complaint  Patient presents with   Abdominal Pain    Low abd and back pain    HPI  Discussed the use of AI scribe software for clinical note transcription with the patient, who gave verbal consent to proceed.  History of Present Illness   Samantha Cross is a 79 year old female with a history of breast cancer who presents with fecal incontinence and recent urinary tract infection.  She has been experiencing fecal incontinence since at least October or November of the previous year. She sometimes does not realize she has had a bowel movement until she notices her clothes are soiled. She reports that she has never been regular with her bowel movements, sometimes going two to three days without a bowel movement. However, she now experiences a lack of control over her bowel movements. She experiences a sensation of needing to have a bowel movement without the ability to do so, and if she does not act on the urge immediately, she may have an accident.  She recently had a urinary tract infection for which she was prescribed antibiotics, completing the course today. She experienced severe back pain during the night, which was alleviated by taking two Tylenol . She is not typically a 'medicine person.'  She has a history of breast cancer and is currently on an aromatase inhibitor. She has a family history of colon cancer, brain tumors, and breast cancer. She has not had a colonoscopy this year.  She recalls a past surgery where a kidney was accidentally punctured, requiring a catheter for an extended period. She has a scar on her abdomen from this procedure, which can become irritated and requires powder to keep dry.  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 She occasionally experiences a funny sensation in her leg, which is not persistent and occurs sporadically. No numbness or tingling in the legs.       {History (Optional):23778}  ROS: as noted in HPI  Objective:     BP 112/68   Pulse 66   Temp 97.6 F (36.4 C)   Ht 5' 4 (1.626 m)   Wt 139 lb (63 kg)   SpO2 94%   BMI 23.86 kg/m  BP Readings from Last 3 Encounters:  07/03/24 112/68  05/25/24 128/78  04/29/24 134/83   Wt Readings from Last 3 Encounters:  07/03/24 139 lb (63 kg)  05/25/24 140 lb 9.6 oz (63.8 kg)  04/29/24 141 lb (64 kg)      Physical Exam   No results found for any visits on 07/03/24.  {Labs (Optional):23779}  The 10-year ASCVD risk score (Arnett DK, et al., 2019) is: 18.9%  Assessment & Plan:  There are no diagnoses linked to this encounter.   No follow-ups on file.   Benton LITTIE Gave, PA "

## 2024-07-03 NOTE — Patient Instructions (Addendum)
 I think you are experiencing over-flow constipation. This can also cause urinary symptoms.  Please start taking 17g (one capful) miralax daily. Please also make a toileting schedule to prevent accidents. Go to the bathroom every 2 hours regardless whether you feel the need or not. Fecal incontinence increases the risk of UTIs.  Please schedule CT abd/ pelvis. You can go to [address] to schedule this next week. Please also call your gastroenterologist and complete a repeat colonoscopy. If you need a new referral, please let us  know

## 2024-07-04 ENCOUNTER — Encounter: Payer: Self-pay | Admitting: Urgent Care

## 2024-07-06 ENCOUNTER — Ambulatory Visit: Admitting: Urology

## 2024-07-06 DIAGNOSIS — N390 Urinary tract infection, site not specified: Secondary | ICD-10-CM

## 2024-07-06 DIAGNOSIS — N952 Postmenopausal atrophic vaginitis: Secondary | ICD-10-CM

## 2024-07-07 ENCOUNTER — Telehealth: Payer: Self-pay | Admitting: Family Medicine

## 2024-07-07 NOTE — Telephone Encounter (Signed)
 Copied from CRM #[id number]. Topic: Appointments - Scheduling Inquiry for Clinic >> Jul 07, 2024  8:28 AM Samantha Cross wrote: Reason for CRM: Patient calling about scheduling a time to have her CT ABDOMEN PELVIS W WO CONTRAST. States she has not gotten a call to schedule and would like to.  Patient can be reached at [phone]

## 2024-07-07 NOTE — Telephone Encounter (Signed)
 Patient was given [facility] ph# [phone] to call and schedule CT scan.

## 2024-07-13 ENCOUNTER — Other Ambulatory Visit

## 2024-07-23 ENCOUNTER — Ambulatory Visit

## 2025-05-25 ENCOUNTER — Inpatient Hospital Stay: Admitting: Hematology and Oncology
